# Patient Record
Sex: Female | Born: 1955
Health system: Southern US, Community
[De-identification: ages and names within clinical notes are randomized; demographics above are authoritative.]

## PROBLEM LIST (undated history)

## (undated) ENCOUNTER — Inpatient Hospital Stay: Admission: EM | Payer: Self-pay | Source: Home / Self Care

## (undated) DIAGNOSIS — I10 Essential (primary) hypertension: Secondary | ICD-10-CM

## (undated) DIAGNOSIS — N950 Postmenopausal bleeding: Secondary | ICD-10-CM

## (undated) DIAGNOSIS — E669 Obesity, unspecified: Secondary | ICD-10-CM

## (undated) DIAGNOSIS — E119 Type 2 diabetes mellitus without complications: Secondary | ICD-10-CM

## (undated) DIAGNOSIS — E785 Hyperlipidemia, unspecified: Secondary | ICD-10-CM

## (undated) HISTORY — DX: Postmenopausal bleeding: N95.0

## (undated) HISTORY — DX: Hyperlipidemia, unspecified: E78.5

## (undated) HISTORY — PX: FOOT SURGERY: SHX648

## (undated) HISTORY — PX: HAND SURGERY: SHX662

## (undated) HISTORY — DX: Essential (primary) hypertension: I10

## (undated) HISTORY — DX: Obesity, unspecified: E66.9

## (undated) HISTORY — PX: TUBAL LIGATION: SHX77

---

## 1997-09-18 ENCOUNTER — Emergency Department (HOSPITAL_COMMUNITY): Admission: EM | Admit: 1997-09-18 | Discharge: 1997-09-18 | Payer: Self-pay | Admitting: Emergency Medicine

## 1997-10-07 ENCOUNTER — Emergency Department (HOSPITAL_COMMUNITY): Admission: EM | Admit: 1997-10-07 | Discharge: 1997-10-07 | Payer: Self-pay | Admitting: Emergency Medicine

## 1997-10-08 ENCOUNTER — Ambulatory Visit (HOSPITAL_COMMUNITY): Admission: RE | Admit: 1997-10-08 | Discharge: 1997-10-08 | Payer: Self-pay | Admitting: Emergency Medicine

## 1997-10-24 ENCOUNTER — Ambulatory Visit (HOSPITAL_COMMUNITY): Admission: RE | Admit: 1997-10-24 | Discharge: 1997-10-24 | Payer: Self-pay | Admitting: Gastroenterology

## 1997-10-29 ENCOUNTER — Ambulatory Visit (HOSPITAL_COMMUNITY): Admission: RE | Admit: 1997-10-29 | Discharge: 1997-10-29 | Payer: Self-pay | Admitting: Gastroenterology

## 1997-10-31 ENCOUNTER — Emergency Department (HOSPITAL_COMMUNITY): Admission: EM | Admit: 1997-10-31 | Discharge: 1997-10-31 | Payer: Self-pay | Admitting: Emergency Medicine

## 1997-11-06 ENCOUNTER — Emergency Department (HOSPITAL_COMMUNITY): Admission: EM | Admit: 1997-11-06 | Discharge: 1997-11-06 | Payer: Self-pay | Admitting: Emergency Medicine

## 1997-11-06 ENCOUNTER — Ambulatory Visit (HOSPITAL_COMMUNITY): Admission: RE | Admit: 1997-11-06 | Discharge: 1997-11-06 | Payer: Self-pay | Admitting: Gastroenterology

## 1997-11-25 ENCOUNTER — Emergency Department (HOSPITAL_COMMUNITY): Admission: EM | Admit: 1997-11-25 | Discharge: 1997-11-25 | Payer: Self-pay | Admitting: Emergency Medicine

## 1998-01-21 ENCOUNTER — Ambulatory Visit (HOSPITAL_COMMUNITY): Admission: RE | Admit: 1998-01-21 | Discharge: 1998-01-21 | Payer: Self-pay | Admitting: Gastroenterology

## 1998-01-27 ENCOUNTER — Ambulatory Visit (HOSPITAL_COMMUNITY): Admission: RE | Admit: 1998-01-27 | Discharge: 1998-01-27 | Payer: Self-pay | Admitting: Gastroenterology

## 1998-01-27 ENCOUNTER — Encounter: Payer: Self-pay | Admitting: Gastroenterology

## 1998-03-05 ENCOUNTER — Encounter: Admission: RE | Admit: 1998-03-05 | Discharge: 1998-06-03 | Payer: Self-pay | Admitting: Orthopedic Surgery

## 1998-11-05 ENCOUNTER — Encounter: Payer: Self-pay | Admitting: Emergency Medicine

## 1998-11-05 ENCOUNTER — Emergency Department (HOSPITAL_COMMUNITY): Admission: EM | Admit: 1998-11-05 | Discharge: 1998-11-05 | Payer: Self-pay | Admitting: Emergency Medicine

## 1999-08-27 ENCOUNTER — Emergency Department (HOSPITAL_COMMUNITY): Admission: EM | Admit: 1999-08-27 | Discharge: 1999-08-27 | Payer: Self-pay | Admitting: Emergency Medicine

## 2000-08-25 ENCOUNTER — Emergency Department (HOSPITAL_COMMUNITY): Admission: EM | Admit: 2000-08-25 | Discharge: 2000-08-25 | Payer: Self-pay | Admitting: Emergency Medicine

## 2001-03-27 ENCOUNTER — Emergency Department (HOSPITAL_COMMUNITY): Admission: EM | Admit: 2001-03-27 | Discharge: 2001-03-27 | Payer: Self-pay | Admitting: Emergency Medicine

## 2001-05-29 ENCOUNTER — Emergency Department (HOSPITAL_COMMUNITY): Admission: EM | Admit: 2001-05-29 | Discharge: 2001-05-29 | Payer: Self-pay

## 2001-05-29 ENCOUNTER — Encounter: Payer: Self-pay | Admitting: Emergency Medicine

## 2001-08-17 ENCOUNTER — Emergency Department (HOSPITAL_COMMUNITY): Admission: EM | Admit: 2001-08-17 | Discharge: 2001-08-17 | Payer: Self-pay

## 2001-12-08 ENCOUNTER — Emergency Department (HOSPITAL_COMMUNITY): Admission: EM | Admit: 2001-12-08 | Discharge: 2001-12-08 | Payer: Self-pay | Admitting: Emergency Medicine

## 2001-12-25 ENCOUNTER — Encounter: Payer: Self-pay | Admitting: Emergency Medicine

## 2001-12-25 ENCOUNTER — Emergency Department (HOSPITAL_COMMUNITY): Admission: EM | Admit: 2001-12-25 | Discharge: 2001-12-25 | Payer: Self-pay | Admitting: Emergency Medicine

## 2002-04-13 ENCOUNTER — Encounter: Payer: Self-pay | Admitting: Emergency Medicine

## 2002-04-13 ENCOUNTER — Emergency Department (HOSPITAL_COMMUNITY): Admission: EM | Admit: 2002-04-13 | Discharge: 2002-04-13 | Payer: Self-pay | Admitting: Emergency Medicine

## 2002-04-19 ENCOUNTER — Emergency Department (HOSPITAL_COMMUNITY): Admission: EM | Admit: 2002-04-19 | Discharge: 2002-04-19 | Payer: Self-pay | Admitting: Emergency Medicine

## 2003-01-15 ENCOUNTER — Encounter: Payer: Self-pay | Admitting: Family Medicine

## 2003-01-15 ENCOUNTER — Emergency Department (HOSPITAL_COMMUNITY): Admission: AD | Admit: 2003-01-15 | Discharge: 2003-01-15 | Payer: Self-pay | Admitting: Family Medicine

## 2003-02-06 ENCOUNTER — Emergency Department (HOSPITAL_COMMUNITY): Admission: EM | Admit: 2003-02-06 | Discharge: 2003-02-06 | Payer: Self-pay | Admitting: Emergency Medicine

## 2003-04-22 ENCOUNTER — Emergency Department (HOSPITAL_COMMUNITY): Admission: AD | Admit: 2003-04-22 | Discharge: 2003-04-22 | Payer: Self-pay | Admitting: Family Medicine

## 2003-04-23 ENCOUNTER — Emergency Department (HOSPITAL_COMMUNITY): Admission: AD | Admit: 2003-04-23 | Discharge: 2003-04-23 | Payer: Self-pay | Admitting: Family Medicine

## 2003-08-30 ENCOUNTER — Emergency Department (HOSPITAL_COMMUNITY): Admission: EM | Admit: 2003-08-30 | Discharge: 2003-08-30 | Payer: Self-pay | Admitting: Emergency Medicine

## 2003-09-04 ENCOUNTER — Emergency Department (HOSPITAL_COMMUNITY): Admission: EM | Admit: 2003-09-04 | Discharge: 2003-09-04 | Payer: Self-pay | Admitting: Emergency Medicine

## 2003-12-23 ENCOUNTER — Emergency Department (HOSPITAL_COMMUNITY): Admission: EM | Admit: 2003-12-23 | Discharge: 2003-12-23 | Payer: Self-pay | Admitting: Emergency Medicine

## 2004-03-03 ENCOUNTER — Emergency Department (HOSPITAL_COMMUNITY): Admission: EM | Admit: 2004-03-03 | Discharge: 2004-03-03 | Payer: Self-pay | Admitting: Emergency Medicine

## 2004-06-24 ENCOUNTER — Emergency Department (HOSPITAL_COMMUNITY): Admission: EM | Admit: 2004-06-24 | Discharge: 2004-06-24 | Payer: Self-pay | Admitting: *Deleted

## 2004-08-16 ENCOUNTER — Emergency Department (HOSPITAL_COMMUNITY): Admission: EM | Admit: 2004-08-16 | Discharge: 2004-08-16 | Payer: Self-pay | Admitting: *Deleted

## 2005-02-05 ENCOUNTER — Emergency Department (HOSPITAL_COMMUNITY): Admission: EM | Admit: 2005-02-05 | Discharge: 2005-02-05 | Payer: Self-pay | Admitting: Emergency Medicine

## 2005-04-19 ENCOUNTER — Emergency Department (HOSPITAL_COMMUNITY): Admission: EM | Admit: 2005-04-19 | Discharge: 2005-04-19 | Payer: Self-pay | Admitting: Emergency Medicine

## 2005-06-22 ENCOUNTER — Emergency Department (HOSPITAL_COMMUNITY): Admission: EM | Admit: 2005-06-22 | Discharge: 2005-06-22 | Payer: Self-pay | Admitting: Emergency Medicine

## 2005-06-29 ENCOUNTER — Emergency Department (HOSPITAL_COMMUNITY): Admission: EM | Admit: 2005-06-29 | Discharge: 2005-06-29 | Payer: Self-pay | Admitting: Emergency Medicine

## 2005-11-15 ENCOUNTER — Emergency Department (HOSPITAL_COMMUNITY): Admission: EM | Admit: 2005-11-15 | Discharge: 2005-11-15 | Payer: Self-pay | Admitting: Emergency Medicine

## 2005-11-15 ENCOUNTER — Inpatient Hospital Stay (HOSPITAL_COMMUNITY): Admission: AD | Admit: 2005-11-15 | Discharge: 2005-11-15 | Payer: Self-pay | Admitting: Gynecology

## 2006-03-06 ENCOUNTER — Emergency Department (HOSPITAL_COMMUNITY): Admission: EM | Admit: 2006-03-06 | Discharge: 2006-03-06 | Payer: Self-pay | Admitting: Emergency Medicine

## 2006-05-26 ENCOUNTER — Emergency Department (HOSPITAL_COMMUNITY): Admission: EM | Admit: 2006-05-26 | Discharge: 2006-05-26 | Payer: Self-pay | Admitting: Emergency Medicine

## 2006-06-20 ENCOUNTER — Emergency Department (HOSPITAL_COMMUNITY): Admission: EM | Admit: 2006-06-20 | Discharge: 2006-06-20 | Payer: Self-pay | Admitting: Emergency Medicine

## 2006-12-29 ENCOUNTER — Emergency Department (HOSPITAL_COMMUNITY): Admission: EM | Admit: 2006-12-29 | Discharge: 2006-12-29 | Payer: Self-pay | Admitting: Emergency Medicine

## 2007-07-16 ENCOUNTER — Emergency Department (HOSPITAL_COMMUNITY): Admission: EM | Admit: 2007-07-16 | Discharge: 2007-07-16 | Payer: Self-pay | Admitting: Emergency Medicine

## 2007-08-10 ENCOUNTER — Emergency Department (HOSPITAL_COMMUNITY): Admission: EM | Admit: 2007-08-10 | Discharge: 2007-08-10 | Payer: Self-pay | Admitting: Emergency Medicine

## 2007-09-05 ENCOUNTER — Emergency Department (HOSPITAL_COMMUNITY): Admission: EM | Admit: 2007-09-05 | Discharge: 2007-09-05 | Payer: Self-pay | Admitting: Emergency Medicine

## 2007-10-05 ENCOUNTER — Emergency Department (HOSPITAL_COMMUNITY): Admission: EM | Admit: 2007-10-05 | Discharge: 2007-10-05 | Payer: Self-pay | Admitting: Emergency Medicine

## 2007-10-27 ENCOUNTER — Emergency Department (HOSPITAL_COMMUNITY): Admission: EM | Admit: 2007-10-27 | Discharge: 2007-10-27 | Payer: Self-pay | Admitting: Emergency Medicine

## 2007-11-02 ENCOUNTER — Ambulatory Visit: Payer: Self-pay | Admitting: Internal Medicine

## 2007-11-15 ENCOUNTER — Ambulatory Visit (HOSPITAL_COMMUNITY): Admission: RE | Admit: 2007-11-15 | Discharge: 2007-11-15 | Payer: Self-pay | Admitting: Internal Medicine

## 2007-12-13 ENCOUNTER — Ambulatory Visit: Payer: Self-pay | Admitting: Internal Medicine

## 2008-01-05 ENCOUNTER — Emergency Department (HOSPITAL_COMMUNITY): Admission: EM | Admit: 2008-01-05 | Discharge: 2008-01-05 | Payer: Self-pay | Admitting: Emergency Medicine

## 2008-01-30 ENCOUNTER — Ambulatory Visit: Payer: Self-pay | Admitting: Internal Medicine

## 2008-01-30 LAB — CONVERTED CEMR LAB
Alkaline Phosphatase: 80 units/L (ref 39–117)
CO2: 23 meq/L (ref 19–32)
Cholesterol: 183 mg/dL (ref 0–200)
Creatinine, Ser: 0.94 mg/dL (ref 0.40–1.20)
Eosinophils Absolute: 0.1 10*3/uL (ref 0.0–0.7)
Eosinophils Relative: 1 % (ref 0–5)
Glucose, Bld: 90 mg/dL (ref 70–99)
HCT: 41.5 % (ref 36.0–46.0)
HDL: 50 mg/dL (ref >39)
Hemoglobin: 13.9 g/dL (ref 12.0–15.0)
LDL Cholesterol: 89 mg/dL (ref 0–99)
Lymphs Abs: 2.5 10*3/uL (ref 0.7–4.0)
MCV: 90.6 fL (ref 78.0–100.0)
Monocytes Absolute: 0.6 10*3/uL (ref 0.1–1.0)
Platelets: 346 10*3/uL (ref 150–400)
RDW: 13.1 % (ref 11.5–15.5)
Sodium: 139 meq/L (ref 135–145)
Total Bilirubin: 0.4 mg/dL (ref 0.3–1.2)
Total CHOL/HDL Ratio: 3.7
Total Protein: 7.8 g/dL (ref 6.0–8.3)
Triglycerides: 219 mg/dL — ABNORMAL HIGH (ref <150)
VLDL: 44 mg/dL — ABNORMAL HIGH (ref 0–40)
WBC: 7.2 10*3/uL (ref 4.0–10.5)

## 2008-03-20 ENCOUNTER — Emergency Department (HOSPITAL_COMMUNITY): Admission: EM | Admit: 2008-03-20 | Discharge: 2008-03-20 | Payer: Self-pay | Admitting: Emergency Medicine

## 2008-04-10 ENCOUNTER — Ambulatory Visit: Payer: Self-pay | Admitting: Internal Medicine

## 2008-04-10 ENCOUNTER — Encounter: Payer: Self-pay | Admitting: Internal Medicine

## 2008-05-07 ENCOUNTER — Ambulatory Visit: Payer: Self-pay | Admitting: Internal Medicine

## 2008-06-17 ENCOUNTER — Emergency Department: Payer: Self-pay | Admitting: Emergency Medicine

## 2008-06-23 ENCOUNTER — Emergency Department (HOSPITAL_COMMUNITY): Admission: EM | Admit: 2008-06-23 | Discharge: 2008-06-23 | Payer: Self-pay | Admitting: Emergency Medicine

## 2008-08-28 ENCOUNTER — Emergency Department (HOSPITAL_COMMUNITY): Admission: EM | Admit: 2008-08-28 | Discharge: 2008-08-28 | Payer: Self-pay | Admitting: Emergency Medicine

## 2008-09-07 ENCOUNTER — Emergency Department (HOSPITAL_COMMUNITY): Admission: EM | Admit: 2008-09-07 | Discharge: 2008-09-07 | Payer: Self-pay | Admitting: Emergency Medicine

## 2008-11-04 ENCOUNTER — Emergency Department (HOSPITAL_COMMUNITY): Admission: EM | Admit: 2008-11-04 | Discharge: 2008-11-04 | Payer: Self-pay | Admitting: Emergency Medicine

## 2009-01-14 ENCOUNTER — Emergency Department (HOSPITAL_COMMUNITY): Admission: EM | Admit: 2009-01-14 | Discharge: 2009-01-14 | Payer: Self-pay | Admitting: Emergency Medicine

## 2010-04-19 ENCOUNTER — Encounter: Payer: Self-pay | Admitting: Family Medicine

## 2010-04-19 ENCOUNTER — Encounter: Payer: Self-pay | Admitting: Internal Medicine

## 2010-06-05 IMAGING — CR DG LUMBAR SPINE 2-3V
1 series · 3 of 3 positions shown · non-contrast
Comparison: none

REASON FOR EXAM: fall pain
COMMENTS:

[Series 1: view not recorded · 0.17mm/px · 3 of 3 slices shown]
[im 1/3]
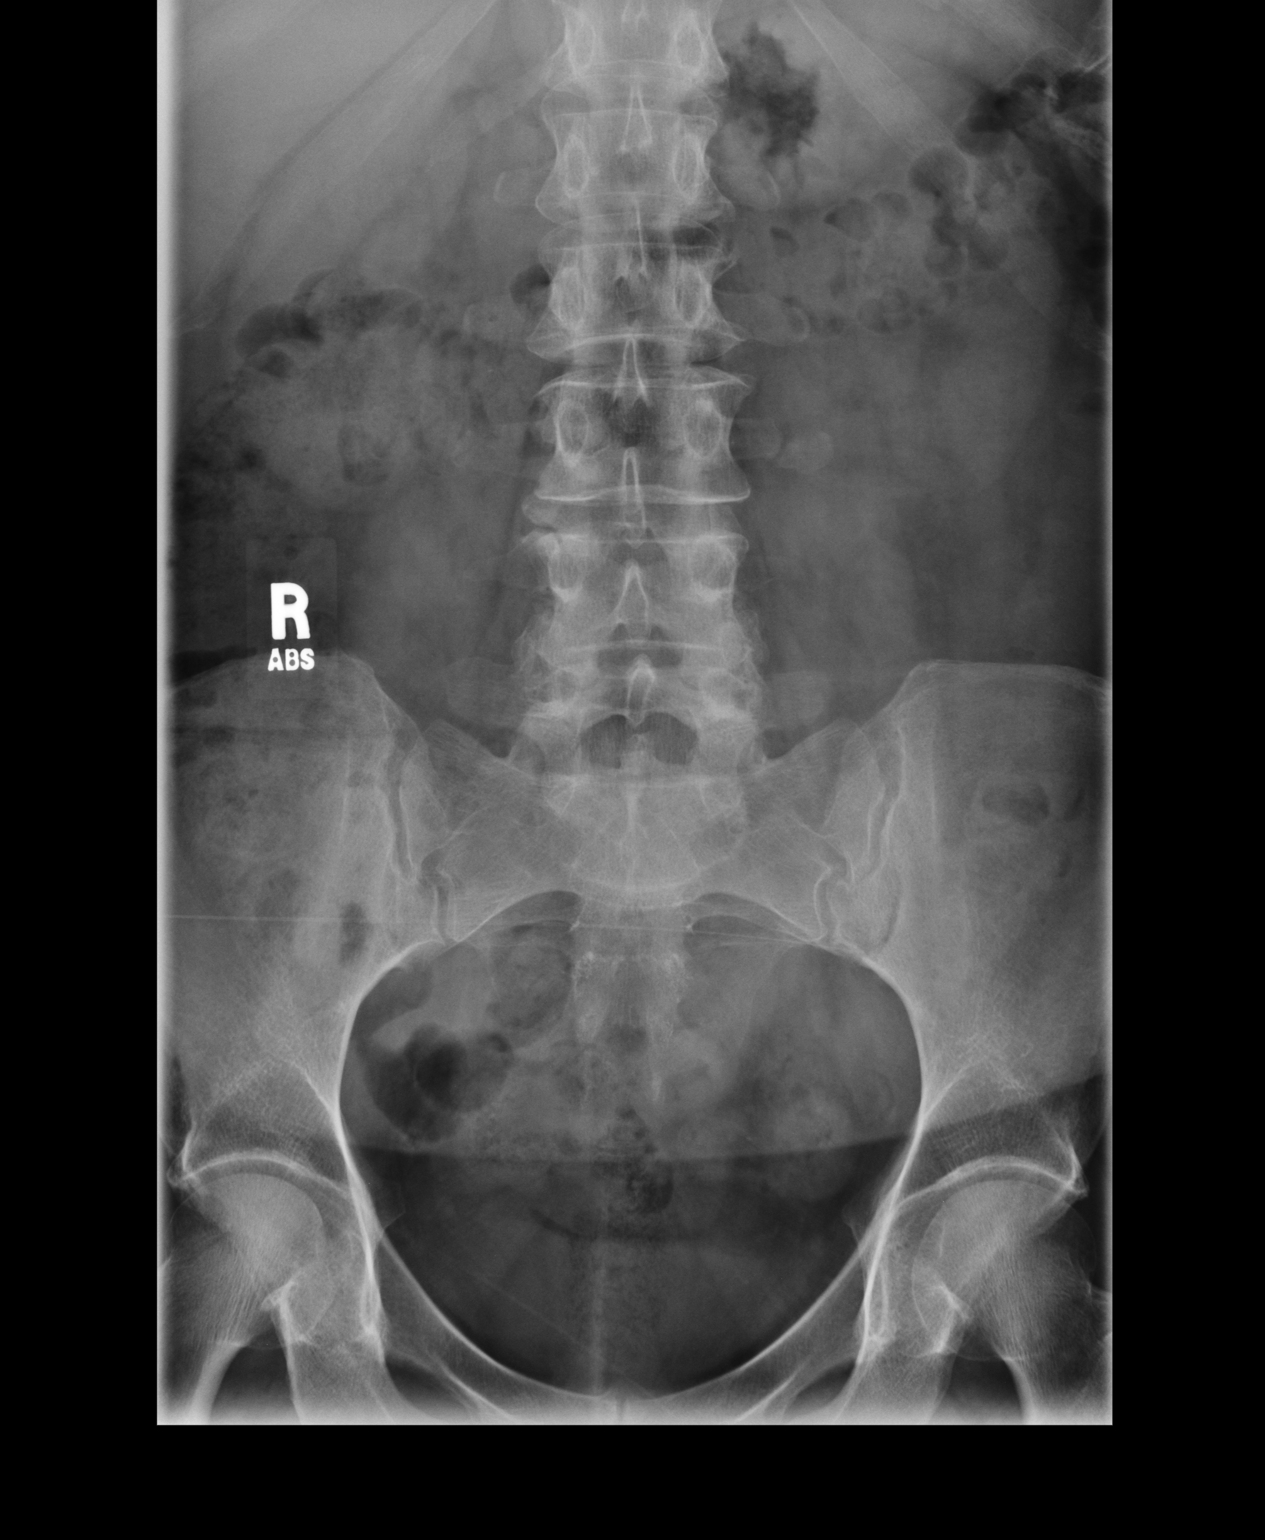
[im 2/3]
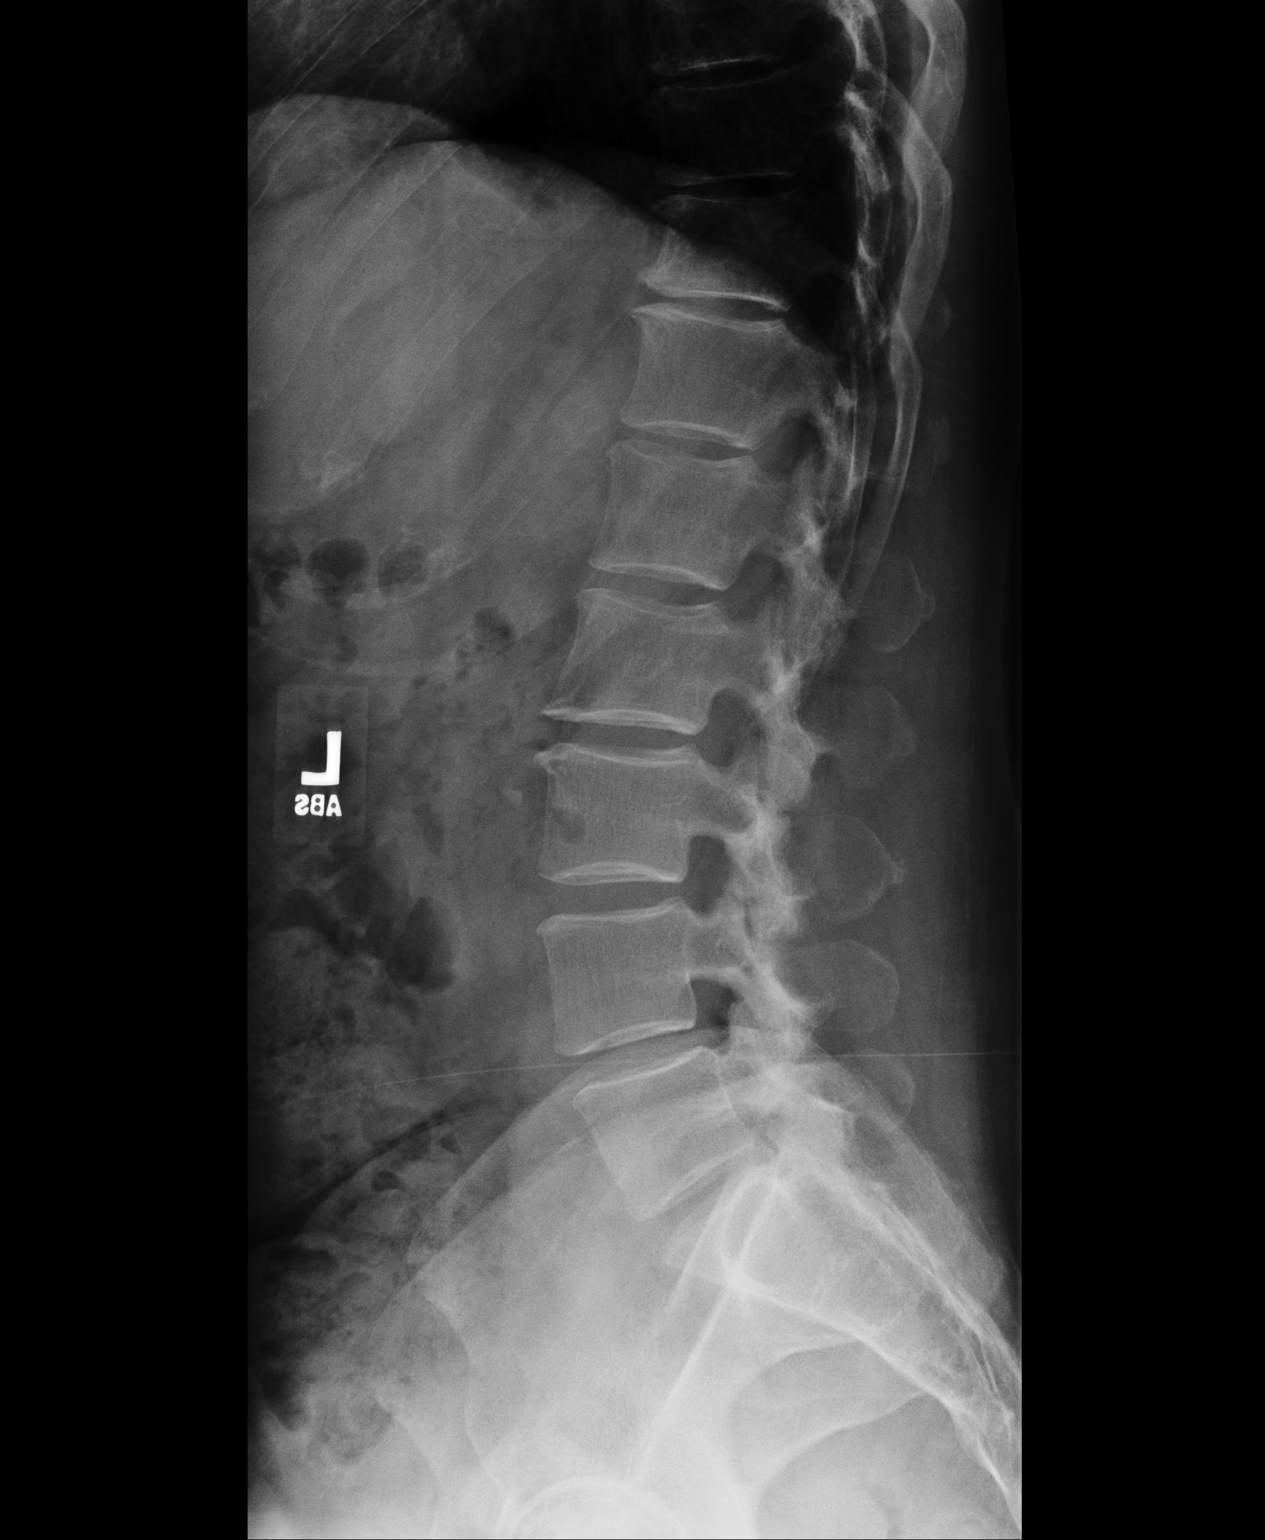
[im 3/3]
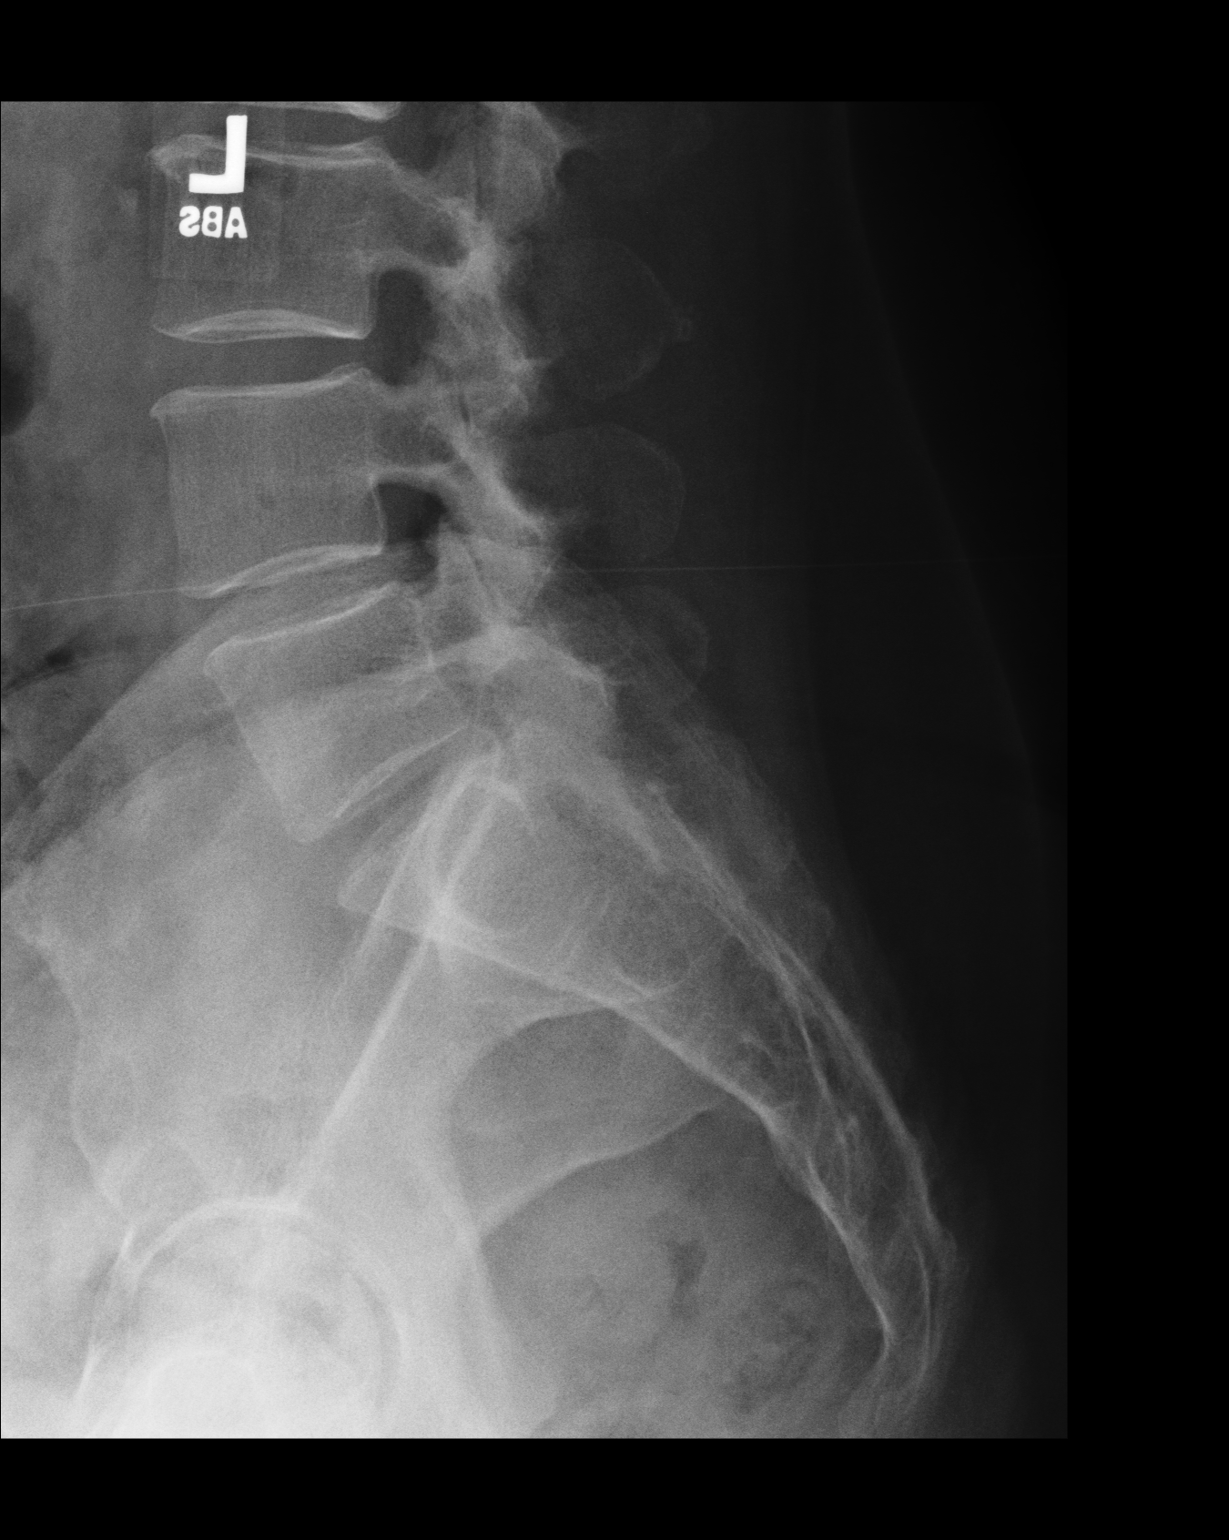

[3 of 3 positions shown; findings below may reference images not displayed]

PROCEDURE:     DXR - DXR LUMBAR SPINE AP AND LATERAL  - June 17, 2008 [DATE]

RESULT:     The vertebral body heights and in the intervertebral disc spaces
are well maintained. Vertebral body alignment is normal. There is mild
degenerative spurring anteriorly at L2 and L3. In the AP view there is noted
a cleft in the left transverse process of L3. This could either be
developmental or secondary to prior trauma. The pedicles are bilaterally
intact.
IMPRESSION: 1. No acute bony abnormalities are identified.
2. There is a cleft in the transverse process at L3 on the left probably
developmental but which could represent residual change from a previous
fracture.
3. There is slight hypertrophic spurring anteriorly at L2 and L3.

## 2010-07-02 LAB — DIFFERENTIAL
Basophils Absolute: 0 K/uL (ref 0.0–0.1)
Basophils Relative: 0 % (ref 0–1)
Eosinophils Absolute: 0.1 K/uL (ref 0.0–0.7)
Eosinophils Relative: 2 % (ref 0–5)
Lymphocytes Relative: 25 % (ref 12–46)
Lymphs Abs: 2 K/uL (ref 0.7–4.0)
Monocytes Absolute: 0.5 K/uL (ref 0.1–1.0)
Monocytes Relative: 6 % (ref 3–12)
Neutro Abs: 5.6 K/uL (ref 1.7–7.7)
Neutrophils Relative %: 67 % (ref 43–77)

## 2010-07-02 LAB — URINALYSIS, ROUTINE W REFLEX MICROSCOPIC
Bilirubin Urine: NEGATIVE
Glucose, UA: NEGATIVE mg/dL
Hgb urine dipstick: NEGATIVE
Ketones, ur: NEGATIVE mg/dL
Specific Gravity, Urine: 1.004 — ABNORMAL LOW (ref 1.005–1.030)
pH: 6.5 (ref 5.0–8.0)

## 2010-07-02 LAB — RAPID URINE DRUG SCREEN, HOSP PERFORMED
Amphetamines: NOT DETECTED
Barbiturates: NOT DETECTED
Benzodiazepines: POSITIVE — AB
Cocaine: POSITIVE — AB
Opiates: POSITIVE — AB
Tetrahydrocannabinol: NOT DETECTED

## 2010-07-02 LAB — COMPREHENSIVE METABOLIC PANEL
ALT: 31 U/L (ref 0–35)
AST: 31 U/L (ref 0–37)
CO2: 27 mEq/L (ref 19–32)
Calcium: 10 mg/dL (ref 8.4–10.5)
Chloride: 104 mEq/L (ref 96–112)
Creatinine, Ser: 0.76 mg/dL (ref 0.4–1.2)
GFR calc Af Amer: >60 mL/min (ref >60)
GFR calc non Af Amer: >60 mL/min (ref >60)
Glucose, Bld: 122 mg/dL — ABNORMAL HIGH (ref 70–99)
Total Bilirubin: 0.5 mg/dL (ref 0.3–1.2)

## 2010-07-02 LAB — CBC
Hemoglobin: 15.7 g/dL — ABNORMAL HIGH (ref 12.0–15.0)
MCHC: 34.7 g/dL (ref 30.0–36.0)
MCV: 88.4 fL (ref 78.0–100.0)
RBC: 5.11 MIL/uL (ref 3.87–5.11)
WBC: 8.3 10*3/uL (ref 4.0–10.5)

## 2010-07-02 LAB — PREGNANCY, URINE: Preg Test, Ur: NEGATIVE

## 2010-07-02 LAB — SALICYLATE LEVEL: Salicylate Lvl: <4 mg/dL (ref 2.8–20.0)

## 2010-07-06 LAB — CBC
HCT: 41 % (ref 36.0–46.0)
Hemoglobin: 14.4 g/dL (ref 12.0–15.0)
Platelets: 318 10*3/uL (ref 150–400)
WBC: 13.5 10*3/uL — ABNORMAL HIGH (ref 4.0–10.5)

## 2010-07-06 LAB — DIFFERENTIAL
Basophils Absolute: 0 10*3/uL (ref 0.0–0.1)
Basophils Relative: 0 % (ref 0–1)
Eosinophils Absolute: 0.1 10*3/uL (ref 0.0–0.7)
Monocytes Absolute: 0.9 10*3/uL (ref 0.1–1.0)
Neutro Abs: 10.8 10*3/uL — ABNORMAL HIGH (ref 1.7–7.7)

## 2010-07-06 LAB — COMPREHENSIVE METABOLIC PANEL
Albumin: 4 g/dL (ref 3.5–5.2)
Alkaline Phosphatase: 74 U/L (ref 39–117)
BUN: 16 mg/dL (ref 6–23)
Chloride: 106 mEq/L (ref 96–112)
Glucose, Bld: 139 mg/dL — ABNORMAL HIGH (ref 70–99)
Potassium: 4.1 mEq/L (ref 3.5–5.1)
Total Bilirubin: 0.7 mg/dL (ref 0.3–1.2)

## 2010-07-06 LAB — URINALYSIS, ROUTINE W REFLEX MICROSCOPIC
Bilirubin Urine: NEGATIVE
Glucose, UA: NEGATIVE mg/dL
Ketones, ur: NEGATIVE mg/dL
Protein, ur: NEGATIVE mg/dL

## 2010-07-06 LAB — URINE MICROSCOPIC-ADD ON

## 2011-08-04 ENCOUNTER — Ambulatory Visit (HOSPITAL_COMMUNITY)
Admission: RE | Admit: 2011-08-04 | Discharge: 2011-08-04 | Disposition: A | Discharge status: Home / Self Care | Payer: Self-pay | Source: Ambulatory Visit | Attending: Psychiatry | Admitting: Psychiatry

## 2011-08-04 DIAGNOSIS — R9431 Abnormal electrocardiogram [ECG] [EKG]: Secondary | ICD-10-CM | POA: Insufficient documentation

## 2011-08-04 DIAGNOSIS — R009 Unspecified abnormalities of heart beat: Secondary | ICD-10-CM

## 2013-03-15 ENCOUNTER — Ambulatory Visit: Payer: Self-pay | Attending: Internal Medicine | Admitting: Internal Medicine

## 2013-03-15 ENCOUNTER — Encounter: Payer: Self-pay | Admitting: Internal Medicine

## 2013-03-15 VITALS — BP 190/80 | HR 62 | Temp 98.7°F | Resp 16 | Wt 189.4 lb

## 2013-03-15 DIAGNOSIS — M62830 Muscle spasm of back: Secondary | ICD-10-CM

## 2013-03-15 DIAGNOSIS — Z23 Encounter for immunization: Secondary | ICD-10-CM

## 2013-03-15 DIAGNOSIS — IMO0001 Reserved for inherently not codable concepts without codable children: Secondary | ICD-10-CM

## 2013-03-15 DIAGNOSIS — N950 Postmenopausal bleeding: Secondary | ICD-10-CM

## 2013-03-15 DIAGNOSIS — F17200 Nicotine dependence, unspecified, uncomplicated: Secondary | ICD-10-CM

## 2013-03-15 DIAGNOSIS — F1910 Other psychoactive substance abuse, uncomplicated: Secondary | ICD-10-CM

## 2013-03-15 DIAGNOSIS — F1911 Other psychoactive substance abuse, in remission: Secondary | ICD-10-CM

## 2013-03-15 DIAGNOSIS — G8929 Other chronic pain: Secondary | ICD-10-CM

## 2013-03-15 DIAGNOSIS — I10 Essential (primary) hypertension: Secondary | ICD-10-CM

## 2013-03-15 DIAGNOSIS — Z139 Encounter for screening, unspecified: Secondary | ICD-10-CM

## 2013-03-15 DIAGNOSIS — M5380 Other specified dorsopathies, site unspecified: Secondary | ICD-10-CM

## 2013-03-15 DIAGNOSIS — M542 Cervicalgia: Secondary | ICD-10-CM | POA: Insufficient documentation

## 2013-03-15 DIAGNOSIS — M549 Dorsalgia, unspecified: Secondary | ICD-10-CM | POA: Insufficient documentation

## 2013-03-15 LAB — CBC WITH DIFFERENTIAL/PLATELET
Basophils Absolute: 0 10*3/uL (ref 0.0–0.1)
Basophils Relative: 0 % (ref 0–1)
Hemoglobin: 14.1 g/dL (ref 12.0–15.0)
MCHC: 34.6 g/dL (ref 30.0–36.0)
Neutro Abs: 6.3 10*3/uL (ref 1.7–7.7)
Neutrophils Relative %: 62 % (ref 43–77)
Platelets: 337 10*3/uL (ref 150–400)
RDW: 13.8 % (ref 11.5–15.5)

## 2013-03-15 LAB — COMPLETE METABOLIC PANEL WITH GFR
AST: 16 U/L (ref 0–37)
Albumin: 4.1 g/dL (ref 3.5–5.2)
Alkaline Phosphatase: 85 U/L (ref 39–117)
Potassium: 4.5 mEq/L (ref 3.5–5.3)
Sodium: 141 mEq/L (ref 135–145)
Total Bilirubin: 0.4 mg/dL (ref 0.3–1.2)
Total Protein: 6.7 g/dL (ref 6.0–8.3)

## 2013-03-15 LAB — LIPID PANEL
HDL: 32 mg/dL — ABNORMAL LOW (ref >39)
LDL Cholesterol: 82 mg/dL (ref 0–99)
Total CHOL/HDL Ratio: 4.7 Ratio
Triglycerides: 183 mg/dL — ABNORMAL HIGH (ref <150)

## 2013-03-15 MED ORDER — CLONIDINE HCL 0.1 MG PO TABS: 0.1000 mg | ORAL_TABLET | Freq: Two times a day (BID) | ORAL | Status: DC

## 2013-03-15 MED ORDER — AMLODIPINE BESYLATE 5 MG PO TABS: 5.0000 mg | ORAL_TABLET | Freq: Every day | ORAL | Status: DC

## 2013-03-15 MED ORDER — CYCLOBENZAPRINE HCL 10 MG PO TABS: 10.0000 mg | ORAL_TABLET | Freq: Three times a day (TID) | ORAL | Status: DC | PRN

## 2013-03-15 MED ORDER — NICOTINE 21 MG/24HR TD PT24: 21.0000 mg | MEDICATED_PATCH | Freq: Every day | TRANSDERMAL | Status: DC

## 2013-03-15 MED ORDER — NAPROXEN 500 MG PO TABS: 500.0000 mg | ORAL_TABLET | Freq: Two times a day (BID) | ORAL | Status: DC

## 2013-03-15 MED ORDER — CLONIDINE HCL 0.1 MG PO TABS
0.1000 mg | ORAL_TABLET | Freq: Once | ORAL | Status: AC
  Administered 2013-03-15: 0.1 mg via ORAL

## 2013-03-15 NOTE — Progress Notes (Signed)
Patient ID: Charlene Allen, female   DOB: June 24, 1955, 57 y.o.   MRN: [id number] MRN: [id number] Name: Charlene Allen  Sex: female Age: 57 y.o. DOB: [birth year]  Allergies: Review of patient's allergies indicates no known allergies.  Chief Complaint  Patient presents with  . Establish Care    HPI: Patient is 57 y.o. female who comes today for a stent establish medical care, she is history of polysubstance abuse upward abuse and is currently enrolled in a methadone program, as per patient her blood pressure was high 6 months ago and she was put on clonidine 0.1 mg twice a day she has not taken her medication today in the morning, has complain of headache on and off denies any change in the vision numbness or weakness, she reported to have chronic neck pain and back pain for several years does not take any medication denies any shooting pain down to the legs denies any incontinence denies any fever chills chest pain or shortness of breath denies any nausea vomiting any urinary or GI symptoms. As per patient she is postmenopausal but has been having vaginal bleeding on and off for the last several months which is getting worse now patient has not seen any gynecologist   History reviewed. No pertinent past medical history.  Past Surgical History  Procedure Laterality Date  . Tubal ligation    . Hand surgery        Medication List       This list is accurate as of: 03/15/13  9:49 AM.  Always use your most recent med list.               amLODipine 5 MG tablet  Commonly known as:  NORVASC  Take 1 tablet (5 mg total) by mouth daily.     cloNIDine 0.1 MG tablet  Commonly known as:  CATAPRES  Take 1 tablet (0.1 mg total) by mouth 2 (two) times daily.     cyclobenzaprine 10 MG tablet  Commonly known as:  FLEXERIL  Take 1 tablet (10 mg total) by mouth 3 (three) times daily as needed for muscle spasms.     naproxen 500 MG tablet  Commonly known as:  NAPROSYN  Take 1 tablet (500 mg  total) by mouth 2 (two) times daily with a meal.     nicotine 21 mg/24hr patch  Commonly known as:  NICODERM CQ  Place 1 patch (21 mg total) onto the skin daily.        Meds ordered this encounter  Medications  . DISCONTD: CLONIDINE HCL PO    Sig: Take by mouth.  . nicotine (NICODERM CQ) 21 mg/24hr patch    Sig: Place 1 patch (21 mg total) onto the skin daily.    Dispense:  28 patch    Refill:  0  . amLODipine (NORVASC) 5 MG tablet    Sig: Take 1 tablet (5 mg total) by mouth daily.    Dispense:  90 tablet    Refill:  3  . cyclobenzaprine (FLEXERIL) 10 MG tablet    Sig: Take 1 tablet (10 mg total) by mouth 3 (three) times daily as needed for muscle spasms.    Dispense:  30 tablet    Refill:  0  . naproxen (NAPROSYN) 500 MG tablet    Sig: Take 1 tablet (500 mg total) by mouth 2 (two) times daily with a meal.    Dispense:  30 tablet    Refill:  2  . cloNIDine (CATAPRES) 0.1 MG  tablet    Sig: Take 1 tablet (0.1 mg total) by mouth 2 (two) times daily.    Dispense:  60 tablet    Refill:  3     There is no immunization history on file for this patient.  Family History  Problem Relation Age of Onset  . COPD Mother   . Stroke Mother   . Heart disease Mother   . Hypertension Mother   . Cancer Sister 25    cervical cancer   . Stroke Maternal Grandmother     History  Substance Use Topics  . Smoking status: Heavy Tobacco Smoker -- 1.00 packs/day for 40 years  . Smokeless tobacco: Not on file  . Alcohol Use: No    Review of Systems  As noted in HPI  Filed Vitals:   03/15/13 0948  BP: 190/80  Pulse:   Temp:   Resp:     Physical Exam  Physical Exam  Constitutional: No distress.  HENT:  Head: Normocephalic and atraumatic.  Eyes: Conjunctivae are normal. Pupils are equal, round, and reactive to light.  Pinpoint pupils( pt on methadone)  Neck: Normal range of motion. Neck supple.  No tenderness on the neck  Cardiovascular: Normal rate and regular rhythm.    Pulmonary/Chest: Breath sounds normal. No respiratory distress. She has no wheezes.  Abdominal: Soft. There is no tenderness.  Musculoskeletal:  Lower lumbar left paraspinal tenderness SLR test negative equal strength both lower studies DTR 2+  Neurological: She has normal reflexes.    CBC    Component Value Date/Time   WBC 8.3 01/14/2009 0952   RBC 5.11 01/14/2009 0952   HGB 15.7* 01/14/2009 0952   HCT 45.2 01/14/2009 0952   PLT 342 01/14/2009 0952   MCV 88.4 01/14/2009 0952   LYMPHSABS 2.0 01/14/2009 0952   MONOABS 0.5 01/14/2009 0952   EOSABS 0.1 01/14/2009 0952   BASOSABS 0.0 01/14/2009 0952    CMP     Component Value Date/Time   NA 139 01/14/2009 0952   K 4.0 01/14/2009 0952   CL 104 01/14/2009 0952   CO2 27 01/14/2009 0952   GLUCOSE 122* 01/14/2009 0952   BUN 12 01/14/2009 0952   CREATININE 0.76 01/14/2009 0952   CALCIUM 10.0 01/14/2009 0952   PROT 8.1 01/14/2009 0952   ALBUMIN 4.5 01/14/2009 0952   AST 31 01/14/2009 0952   ALT 31 01/14/2009 0952   ALKPHOS 88 01/14/2009 0952   BILITOT 0.5 01/14/2009 0952   GFRNONAA >60 01/14/2009 0952   GFRAA  Value: >60        The eGFR has been calculated using the MDRD equation. This calculation has not been validated in all clinical situations. eGFR's persistently <60 mL/min signify possible Chronic Kidney Disease. 01/14/2009 0952    Lab Results  Component Value Date/Time   CHOL 183 01/30/2008 11:10 PM    No components found with this basename: hga1c    Lab Results  Component Value Date/Time   AST 31 01/14/2009  9:52 AM    Assessment and Plan  Chronic back pain/Chronic neck pain  - Plan: Will try  naproxen (NAPROSYN) 500 MG tablet twice a day when necessary   Essential hypertension, benign - Plan: pt did not take her medication today  manual blood pressure is 190/80 advised patient for low salt diet patient was given clonidine in the office will start on amLODipine (NORVASC) 5 MG tablet daily, continue with her  cloNIDine (CATAPRES) 0.1 MG tablet twice a day.  History of  substance abuse ... patient is currently enrolled in methadone program  Postmenopausal bleeding - Plan: CBC with Differential, Ambulatory referral to Gynecology  Screening - Plan: COMPLETE METABOLIC PANEL WITH GFR, TSH, Lipid panel, Vit D  25 hydroxy (rtn osteoporosis monitoring)  Smoking - Plan: nicotine (NICODERM CQ) 21 mg/24hr patch  Back muscle spasm - Plan: Patient advised to apply heating pad also prescribed cyclobenzaprine (FLEXERIL) 10 MG tablet advised patient to take only at night.  Needs flu shot   Health Maintenance  -Pap Smear: referred to GYN Flu shot given today    Return in about 4 weeks (around 04/12/2013).  Doris Cheadle, MD

## 2013-03-15 NOTE — Patient Instructions (Signed)

## 2013-03-15 NOTE — Progress Notes (Signed)
Patient here to establish care Takes clonidine for HTN Has had surgery for tubal ligation and  Carpal tunnel surgery Has not had a period in eight years but lately at least once a month Gets a gush of bright red blood Has chronic back pain and neck pain Has bulging disk in neck and back

## 2013-03-16 ENCOUNTER — Telehealth: Payer: Self-pay | Admitting: Emergency Medicine

## 2013-03-16 LAB — VITAMIN D 25 HYDROXY (VIT D DEFICIENCY, FRACTURES): Vit D, 25-Hydroxy: 27 ng/mL — ABNORMAL LOW (ref 30–89)

## 2013-03-16 NOTE — Telephone Encounter (Signed)
Pt given lab results and informed to start taking otc Vitamin d 1000 units supplement and low fat diet

## 2013-03-16 NOTE — Telephone Encounter (Signed)
Message copied by Darlis Loan on Fri Mar 16, 2013 10:17 AM ------      Message from: Doris Cheadle      Created: Fri Mar 16, 2013  9:19 AM       Blood work reviewed, noticed low vitamin D, call patient advise to start taking OTC vitamin d 1000 units daily       Also has elevated triglycerides, advise for low fat diet.       ------

## 2013-03-19 ENCOUNTER — Telehealth: Payer: Self-pay

## 2013-03-19 NOTE — Telephone Encounter (Signed)
Message copied by Lestine Mount on Mon Mar 19, 2013 11:44 AM ------      Message from: Doris Cheadle      Created: Fri Mar 16, 2013  9:19 AM       Blood work reviewed, noticed low vitamin D, call patient advise to start taking OTC vitamin d 1000 units daily       Also has elevated triglycerides, advise for low fat diet.       ------

## 2013-04-12 ENCOUNTER — Ambulatory Visit: Payer: Self-pay | Admitting: Internal Medicine

## 2013-04-18 ENCOUNTER — Ambulatory Visit: Payer: Self-pay

## 2013-05-07 ENCOUNTER — Encounter: Payer: Self-pay | Admitting: Obstetrics & Gynecology

## 2013-05-22 ENCOUNTER — Ambulatory Visit: Payer: Self-pay | Admitting: Internal Medicine

## 2013-06-01 ENCOUNTER — Ambulatory Visit: Payer: Self-pay

## 2013-06-06 ENCOUNTER — Ambulatory Visit: Payer: Self-pay

## 2013-06-13 ENCOUNTER — Ambulatory Visit: Payer: Self-pay | Attending: Internal Medicine | Admitting: Internal Medicine

## 2013-06-13 ENCOUNTER — Encounter: Payer: Self-pay | Admitting: Internal Medicine

## 2013-06-13 VITALS — BP 157/94 | HR 68 | Temp 98.4°F | Resp 16

## 2013-06-13 DIAGNOSIS — M549 Dorsalgia, unspecified: Secondary | ICD-10-CM | POA: Insufficient documentation

## 2013-06-13 DIAGNOSIS — I10 Essential (primary) hypertension: Secondary | ICD-10-CM

## 2013-06-13 DIAGNOSIS — F1911 Other psychoactive substance abuse, in remission: Secondary | ICD-10-CM

## 2013-06-13 DIAGNOSIS — E559 Vitamin D deficiency, unspecified: Secondary | ICD-10-CM

## 2013-06-13 DIAGNOSIS — M5380 Other specified dorsopathies, site unspecified: Secondary | ICD-10-CM

## 2013-06-13 DIAGNOSIS — M542 Cervicalgia: Secondary | ICD-10-CM | POA: Insufficient documentation

## 2013-06-13 DIAGNOSIS — E781 Pure hyperglyceridemia: Secondary | ICD-10-CM

## 2013-06-13 DIAGNOSIS — IMO0001 Reserved for inherently not codable concepts without codable children: Secondary | ICD-10-CM

## 2013-06-13 DIAGNOSIS — F1910 Other psychoactive substance abuse, uncomplicated: Secondary | ICD-10-CM

## 2013-06-13 DIAGNOSIS — Z79899 Other long term (current) drug therapy: Secondary | ICD-10-CM | POA: Insufficient documentation

## 2013-06-13 DIAGNOSIS — F17200 Nicotine dependence, unspecified, uncomplicated: Secondary | ICD-10-CM | POA: Insufficient documentation

## 2013-06-13 DIAGNOSIS — G8929 Other chronic pain: Secondary | ICD-10-CM

## 2013-06-13 DIAGNOSIS — M62830 Muscle spasm of back: Secondary | ICD-10-CM

## 2013-06-13 MED ORDER — AMLODIPINE BESYLATE 10 MG PO TABS: 10.0000 mg | ORAL_TABLET | Freq: Every day | ORAL | Status: DC

## 2013-06-13 MED ORDER — DICLOFENAC SODIUM 75 MG PO TBEC: 75.0000 mg | DELAYED_RELEASE_TABLET | Freq: Two times a day (BID) | ORAL | Status: DC

## 2013-06-13 MED ORDER — METHOCARBAMOL 500 MG PO TABS: 500.0000 mg | ORAL_TABLET | Freq: Every day | ORAL | Status: DC

## 2013-06-13 NOTE — Progress Notes (Signed)
  MRN: [id number] Name: Charlene Allen  Sex: female Age: 57 y.o. DOB: 03/12/1956  Allergies: Review of patient's allergies indicates no known allergies.  Chief Complaint  Patient presents with  . Back Pain    HPI: Patient is 57 y.o. female who has history of hypertension chronic neck pain back pain comes today for followup her, she had a blood work done which was reviewed with the patient, noticed vitamin D deficiency as well as hypertriglyceridemia patient has already been advised to start taking OTC vitamin D, modify the diet, for pain she has tried naproxen as per patient didn't help much also tried Flexeril for muscle spasm. Patient has history of substance abuse following up with methadone clinic.  History reviewed. No pertinent past medical history.  Past Surgical History  Procedure Laterality Date  . Tubal ligation    . Hand surgery        Medication List       This list is accurate as of: 06/13/13  9:33 AM.  Always use your most recent med list.               amLODipine 10 MG tablet  Commonly known as:  NORVASC  Take 1 tablet (10 mg total) by mouth daily.     cloNIDine 0.1 MG tablet  Commonly known as:  CATAPRES  Take 1 tablet (0.1 mg total) by mouth 2 (two) times daily.     diclofenac 75 MG EC tablet  Commonly known as:  VOLTAREN  Take 1 tablet (75 mg total) by mouth 2 (two) times daily.     methocarbamol 500 MG tablet  Commonly known as:  ROBAXIN  Take 1 tablet (500 mg total) by mouth at bedtime.     nicotine 21 mg/24hr patch  Commonly known as:  NICODERM CQ  Place 1 patch (21 mg total) onto the skin daily.        Meds ordered this encounter  Medications  . amLODipine (NORVASC) 10 MG tablet    Sig: Take 1 tablet (10 mg total) by mouth daily.    Dispense:  90 tablet    Refill:  3  . methocarbamol (ROBAXIN) 500 MG tablet    Sig: Take 1 tablet (500 mg total) by mouth at bedtime.    Dispense:  30 tablet    Refill:  2  . diclofenac (VOLTAREN)  75 MG EC tablet    Sig: Take 1 tablet (75 mg total) by mouth 2 (two) times daily.    Dispense:  60 tablet    Refill:  3     There is no immunization history on file for this patient.  Family History  Problem Relation Age of Onset  . COPD Mother   . Stroke Mother   . Heart disease Mother   . Hypertension Mother   . Cancer Sister 25    cervical cancer   . Stroke Maternal Grandmother     History  Substance Use Topics  . Smoking status: Heavy Tobacco Smoker -- 1.00 packs/day for 40 years  . Smokeless tobacco: Not on file  . Alcohol Use: No    Review of Systems   As noted in HPI  Filed Vitals:   06/13/13 0915  BP: 157/94  Pulse: 68  Temp: 98.4 F (36.9 C)  Resp: 16    Physical Exam  Physical Exam  Constitutional: No distress.  Eyes: EOM are normal. Pupils are equal, round, and reactive to light.  Cardiovascular: Normal rate and regular rhythm.     Pulmonary/Chest: Breath sounds normal. No respiratory distress. She has no wheezes. She has no rales.  Musculoskeletal:  Lower lumbar left paraspinal tenderness SLR test negative equal strength both lower studies DTR 2+     CBC    Component Value Date/Time   WBC 10.1 03/15/2013 1003   RBC 4.94 03/15/2013 1003   HGB 14.1 03/15/2013 1003   HCT 40.8 03/15/2013 1003   PLT 337 03/15/2013 1003   MCV 82.6 03/15/2013 1003   LYMPHSABS 3.0 03/15/2013 1003   MONOABS 0.6 03/15/2013 1003   EOSABS 0.2 03/15/2013 1003   BASOSABS 0.0 03/15/2013 1003    CMP     Component Value Date/Time   NA 141 03/15/2013 1003   K 4.5 03/15/2013 1003   CL 104 03/15/2013 1003   CO2 27 03/15/2013 1003   GLUCOSE 79 03/15/2013 1003   BUN 7 03/15/2013 1003   CREATININE 0.71 03/15/2013 1003   CREATININE 0.76 01/14/2009 0952   CALCIUM 9.4 03/15/2013 1003   PROT 6.7 03/15/2013 1003   ALBUMIN 4.1 03/15/2013 1003   AST 16 03/15/2013 1003   ALT 17 03/15/2013 1003   ALKPHOS 85 03/15/2013 1003   BILITOT 0.4 03/15/2013 1003   GFRNONAA >60  01/14/2009 0952   GFRAA  Value: >60        The eGFR has been calculated using the MDRD equation. This calculation has not been validated in all clinical situations. eGFR's persistently <60 mL/min signify possible Chronic Kidney Disease. 01/14/2009 0952    Lab Results  Component Value Date/Time   CHOL 151 03/15/2013 10:03 AM    No components found with this basename: hga1c    Lab Results  Component Value Date/Time   AST 16 03/15/2013 10:03 AM    Assessment and Plan  Essential hypertension, benign uncontrolled - Plan: Advised for DASH diet and her increase her Norvasc to 10 mg daily amLODipine (NORVASC) 10 MG tablet  Unspecified vitamin D deficiency OTC vitamin D supplement  Hypertriglyceridemia Low-fat diet.  Back muscle spasm - Plan: Patient will try Robaxin  methocarbamol (ROBAXIN) 500 MG tablet  History of substance abuse Following up with methadone clinic.   Chronic neck pain - Plan: diclofenac (VOLTAREN) 75 MG EC tablet  Chronic back pain - Plan: diclofenac (VOLTAREN) 75 MG EC tablet  Smoking Patient is advised to quit smoking.   Return in about 3 months (around 09/13/2013) for hypertension.  ADVANI, DEEPAK, MD   

## 2013-06-13 NOTE — Patient Instructions (Addendum)
DASH Diet  The DASH diet stands for "Dietary Approaches to Stop Hypertension." It is a healthy eating plan that has been shown to reduce high blood pressure (hypertension) in as little as 14 days, while also possibly providing other significant health benefits. These other health benefits include reducing the risk of breast cancer after menopause and reducing the risk of type 2 diabetes, heart disease, colon cancer, and stroke. Health benefits also include weight loss and slowing kidney failure in patients with chronic kidney disease.   DIET GUIDELINES  · Limit salt (sodium). Your diet should contain less than 1500 mg of sodium daily.  · Limit refined or processed carbohydrates. Your diet should include mostly whole grains. Desserts and added sugars should be used sparingly.  · Include small amounts of heart-healthy fats. These types of fats include nuts, oils, and tub margarine. Limit saturated and trans fats. These fats have been shown to be harmful in the body.  CHOOSING FOODS   The following food groups are based on a 2000 calorie diet. See your Registered Dietitian for individual calorie needs.  Grains and Grain Products (6 to 8 servings daily)  · Eat More Often: Whole-wheat bread, brown rice, whole-grain or wheat pasta, quinoa, popcorn without added fat or salt (air popped).  · Eat Less Often: White bread, white pasta, white rice, cornbread.  Vegetables (4 to 5 servings daily)  · Eat More Often: Fresh, frozen, and canned vegetables. Vegetables may be raw, steamed, roasted, or grilled with a minimal amount of fat.  · Eat Less Often/Avoid: Creamed or fried vegetables. Vegetables in a cheese sauce.  Fruit (4 to 5 servings daily)  · Eat More Often: All fresh, canned (in natural juice), or frozen fruits. Dried fruits without added sugar. One hundred percent fruit juice (½ cup [237 mL] daily).  · Eat Less Often: Dried fruits with added sugar. Canned fruit in light or heavy syrup.  Lean Meats, Fish, and Poultry (2  servings or less daily. One serving is 3 to 4 oz [85-114 g]).  · Eat More Often: Ninety percent or leaner ground beef, tenderloin, sirloin. Round cuts of beef, chicken breast, turkey breast. All fish. Grill, bake, or broil your meat. Nothing should be fried.  · Eat Less Often/Avoid: Fatty cuts of meat, turkey, or chicken leg, thigh, or wing. Fried cuts of meat or fish.  Dairy (2 to 3 servings)  · Eat More Often: Low-fat or fat-free milk, low-fat plain or light yogurt, reduced-fat or part-skim cheese.  · Eat Less Often/Avoid: Milk (whole, 2%). Whole milk yogurt. Full-fat cheeses.  Nuts, Seeds, and Legumes (4 to 5 servings per week)  · Eat More Often: All without added salt.  · Eat Less Often/Avoid: Salted nuts and seeds, canned beans with added salt.  Fats and Sweets (limited)  · Eat More Often: Vegetable oils, tub margarines without trans fats, sugar-free gelatin. Mayonnaise and salad dressings.  · Eat Less Often/Avoid: Coconut oils, palm oils, butter, stick margarine, cream, half and half, cookies, candy, pie.  FOR MORE INFORMATION  The Dash Diet Eating Plan: www.dashdiet.org  Document Released: 03/04/2011 Document Revised: 06/07/2011 Document Reviewed: 03/04/2011  [employer]® Patient Information ©2014 [employer].  Fat and Cholesterol Control Diet  Fat and cholesterol levels in your blood and organs are influenced by your diet. High levels of fat and cholesterol may lead to diseases of the heart, small and large blood vessels, gallbladder, liver, and pancreas.  CONTROLLING FAT AND CHOLESTEROL WITH DIET  Although exercise and lifestyle factors   replace saturated and trans fat with other types of fat, such as monounsaturated fat, polyunsaturated fat, and omega-3 fatty acids. On average, a person should consume no  more than 15 to 17 g of saturated fat daily. Saturated and trans fats are considered "bad" fats, and they will raise LDL cholesterol. Saturated fats are primarily found in animal products such as meats, butter, and cream. However, that does not mean you need to give up all your favorite foods. Today, there are good tasting, low-fat, low-cholesterol substitutes for most of the things you like to eat. Choose low-fat or nonfat alternatives. Choose round or loin cuts of red meat. These types of cuts are lowest in fat and cholesterol. Chicken (without the skin), fish, veal, and ground [place] breast are great choices. Eliminate fatty meats, such as hot dogs and salami. Even shellfish have little or no saturated fat. Have a 3 oz (85 g) portion when you eat lean meat, poultry, or fish. Trans fats are also called "partially hydrogenated oils." They are oils that have been scientifically manipulated so that they are solid at room temperature resulting in a longer shelf life and improved taste and texture of foods in which they are added. Trans fats are found in stick margarine, some tub margarines, cookies, crackers, and baked goods.  When baking and cooking, oils are a great substitute for butter. The monounsaturated oils are especially beneficial since it is believed they lower LDL and raise HDL. The oils you should avoid entirely are saturated tropical oils, such as coconut and palm.  Remember to eat a lot from food groups that are naturally free of saturated and trans fat, including fish, fruit, vegetables, beans, grains (barley, rice, couscous, bulgur wheat), and pasta (without cream sauces).  IDENTIFYING FOODS THAT LOWER FAT AND CHOLESTEROL  Soluble fiber may lower your cholesterol. This type of fiber is found in fruits such as apples, vegetables such as broccoli, potatoes, and carrots, legumes such as beans, peas, and lentils, and grains such as barley. Foods fortified with plant sterols (phytosterol) may also  lower cholesterol. You should eat at least 2 g per day of these foods for a cholesterol lowering effect.  Read package labels to identify low-saturated fats, trans fat free, and low-fat foods at the supermarket. Select cheeses that have only 2 to 3 g saturated fat per ounce. Use a heart-healthy tub margarine that is free of trans fats or partially hydrogenated oil. When buying baked goods (cookies, crackers), avoid partially hydrogenated oils. Breads and muffins should be made from whole grains (whole-wheat or whole oat flour, instead of "flour" or "enriched flour"). Buy non-creamy canned soups with reduced salt and no added fats.  FOOD PREPARATION TECHNIQUES  Never deep-fry. If you must fry, either stir-fry, which uses very little fat, or use non-stick cooking sprays. When possible, broil, bake, or roast meats, and steam vegetables. Instead of putting butter or margarine on vegetables, use lemon and herbs, applesauce, and cinnamon (for squash and sweet potatoes). Use nonfat yogurt, salsa, and low-fat dressings for salads.  LOW-SATURATED FAT / LOW-FAT FOOD SUBSTITUTES Meats / Saturated Fat (g)  Avoid: Steak, marbled (3 oz/85 g) / 11 g  Choose: Steak, lean (3 oz/85 g) / 4 g  Avoid: Hamburger (3 oz/85 g) / 7 g  Choose: Hamburger, lean (3 oz/85 g) / 5 g  Avoid: Ham (3 oz/85 g) / 6 g  Choose: Ham, lean cut (3 oz/85 g) / 2.4 g  Avoid: Chicken, with skin, dark meat (3 oz/85  g) / 4 g  Choose: Chicken, skin removed, dark meat (3 oz/85 g) / 2 g  Avoid: Chicken, with skin, light meat (3 oz/85 g) / 2.5 g  Choose: Chicken, skin removed, light meat (3 oz/85 g) / 1 g Dairy / Saturated Fat (g)  Avoid: Whole milk (1 cup) / 5 g  Choose: Low-fat milk, 2% (1 cup) / 3 g  Choose: Low-fat milk, 1% (1 cup) / 1.5 g  Choose: Skim milk (1 cup) / 0.3 g  Avoid: Hard cheese (1 oz/28 g) / 6 g  Choose: Skim milk cheese (1 oz/28 g) / 2 to 3 g  Avoid: Cottage cheese, 4% fat (1 cup) / 6.5 g  Choose: Low-fat  cottage cheese, 1% fat (1 cup) / 1.5 g  Avoid: Ice cream (1 cup) / 9 g  Choose: Sherbet (1 cup) / 2.5 g  Choose: Nonfat frozen yogurt (1 cup) / 0.3 g  Choose: Frozen fruit bar / trace  Avoid: Whipped cream (1 tbs) / 3.5 g  Choose: Nondairy whipped topping (1 tbs) / 1 g Condiments / Saturated Fat (g)  Avoid: Mayonnaise (1 tbs) / 2 g  Choose: Low-fat mayonnaise (1 tbs) / 1 g  Avoid: Butter (1 tbs) / 7 g  Choose: Extra light margarine (1 tbs) / 1 g  Avoid: Coconut oil (1 tbs) / 11.8 g  Choose: Olive oil (1 tbs) / 1.8 g  Choose: Corn oil (1 tbs) / 1.7 g  Choose: Safflower oil (1 tbs) / 1.2 g  Choose: Sunflower oil (1 tbs) / 1.4 g  Choose: Soybean oil (1 tbs) / 2.4 g  Choose: Canola oil (1 tbs) / 1 g Document Released: 03/15/2005 Document Revised: 07/10/2012 Document Reviewed: 09/03/2010 [employer] Patient Information 2014 [place], [place].

## 2013-06-13 NOTE — Progress Notes (Signed)
Patient complains of lower back pain Muscle contractions in her back Right side of her neck gets numb at times

## 2013-08-09 ENCOUNTER — Other Ambulatory Visit: Payer: Self-pay | Admitting: Internal Medicine

## 2013-08-09 DIAGNOSIS — I10 Essential (primary) hypertension: Secondary | ICD-10-CM

## 2013-08-16 ENCOUNTER — Encounter: Payer: Self-pay | Admitting: Internal Medicine

## 2013-08-16 ENCOUNTER — Ambulatory Visit: Payer: Self-pay | Attending: Internal Medicine | Admitting: Internal Medicine

## 2013-08-16 VITALS — BP 155/81 | HR 62 | Temp 97.5°F | Resp 16 | Ht 63.5 in | Wt 195.6 lb

## 2013-08-16 DIAGNOSIS — M542 Cervicalgia: Secondary | ICD-10-CM

## 2013-08-16 DIAGNOSIS — N925 Other specified irregular menstruation: Secondary | ICD-10-CM

## 2013-08-16 DIAGNOSIS — N926 Irregular menstruation, unspecified: Secondary | ICD-10-CM | POA: Insufficient documentation

## 2013-08-16 DIAGNOSIS — F17200 Nicotine dependence, unspecified, uncomplicated: Secondary | ICD-10-CM | POA: Insufficient documentation

## 2013-08-16 DIAGNOSIS — N949 Unspecified condition associated with female genital organs and menstrual cycle: Secondary | ICD-10-CM

## 2013-08-16 DIAGNOSIS — G8929 Other chronic pain: Secondary | ICD-10-CM

## 2013-08-16 DIAGNOSIS — N938 Other specified abnormal uterine and vaginal bleeding: Secondary | ICD-10-CM

## 2013-08-16 DIAGNOSIS — M549 Dorsalgia, unspecified: Secondary | ICD-10-CM | POA: Insufficient documentation

## 2013-08-16 DIAGNOSIS — R519 Headache, unspecified: Secondary | ICD-10-CM

## 2013-08-16 DIAGNOSIS — R51 Headache: Secondary | ICD-10-CM

## 2013-08-16 DIAGNOSIS — I10 Essential (primary) hypertension: Secondary | ICD-10-CM | POA: Insufficient documentation

## 2013-08-16 DIAGNOSIS — Z79899 Other long term (current) drug therapy: Secondary | ICD-10-CM | POA: Insufficient documentation

## 2013-08-16 MED ORDER — GABAPENTIN 100 MG PO CAPS: 100.0000 mg | ORAL_CAPSULE | Freq: Three times a day (TID) | ORAL | Status: DC

## 2013-08-16 NOTE — Patient Instructions (Signed)
Cut back on diclofenac pills, they may increase your blood pressure. May continue taking Robaxin as needed for muscle spasms.   Back Pain, Adult Low back pain is very common. About 1 in 5 people have back pain.The cause of low back pain is rarely dangerous. The pain often gets better over time.About half of people with a sudden onset of back pain feel better in just 2 weeks. About 8 in 10 people feel better by 6 weeks.  CAUSES Some common causes of back pain include:  Strain of the muscles or ligaments supporting the spine.  Wear and tear (degeneration) of the spinal discs.  Arthritis.  Direct injury to the back. DIAGNOSIS Most of the time, the direct cause of low back pain is not known.However, back pain can be treated effectively even when the exact cause of the pain is unknown.Answering your caregiver's questions about your overall health and symptoms is one of the most accurate ways to make sure the cause of your pain is not dangerous. If your caregiver needs more information, he or she may order lab work or imaging tests (X-rays or MRIs).However, even if imaging tests show changes in your back, this usually does not require surgery. HOME CARE INSTRUCTIONS For many people, back pain returns.Since low back pain is rarely dangerous, it is often a condition that people can learn to [facility] their own.   Remain active. It is stressful on the back to sit or stand in one place. Do not sit, drive, or stand in one place for more than 30 minutes at a time. Take short walks on level surfaces as soon as pain allows.Try to increase the length of time you walk each day.  Do not stay in bed.Resting more than 1 or 2 days can delay your recovery.  Do not avoid exercise or work.Your body is made to move.It is not dangerous to be active, even though your back may hurt.Your back will likely heal faster if you return to being active before your pain is gone.  Pay attention to your body when you  bend and lift. Many people have less discomfortwhen lifting if they bend their knees, keep the load close to their bodies,and avoid twisting. Often, the most comfortable positions are those that put less stress on your recovering back.  Find a comfortable position to sleep. Use a firm mattress and lie on your side with your knees slightly bent. If you lie on your back, put a pillow under your knees.  Only take over-the-counter or prescription medicines as directed by your caregiver. Over-the-counter medicines to reduce pain and inflammation are often the most helpful.Your caregiver may prescribe muscle relaxant drugs.These medicines help dull your pain so you can more quickly return to your normal activities and healthy exercise.  Put ice on the injured area.  Put ice in a plastic bag.  Place a towel between your skin and the bag.  Leave the ice on for 15-20 minutes, 03-04 times a day for the first 2 to 3 days. After that, ice and heat may be alternated to reduce pain and spasms.  Ask your caregiver about trying back exercises and gentle massage. This may be of some benefit.  Avoid feeling anxious or stressed.Stress increases muscle tension and can worsen back pain.It is important to recognize when you are anxious or stressed and learn ways to manage it.Exercise is a great option. SEEK MEDICAL CARE IF:  You have pain that is not relieved with rest or medicine.  You  have pain that does not improve in 1 week.  You have new symptoms.  You are generally not feeling well. SEEK IMMEDIATE MEDICAL CARE IF:   You have pain that radiates from your back into your legs.  You develop new bowel or bladder control problems.  You have unusual weakness or numbness in your arms or legs.  You develop nausea or vomiting.  You develop abdominal pain.  You feel faint. Document Released: 03/15/2005 Document Revised: 09/14/2011 Document Reviewed: 08/03/2010 [facility] Patient Information 2014  [place], [place].

## 2013-08-16 NOTE — Progress Notes (Signed)
Patient is here to F/U for 2 month history of neck pain and numbness.  States pain has worsened over last 3 weeks and is now causing headaches. States she had not had a menses for 8 years but has been having irregular periods for last 5-6 months.

## 2013-08-16 NOTE — Progress Notes (Signed)
Patient ID: Charlene Allen, female   DOB: Jul 20, 1955, 58 y.o.   MRN: [id number]  CC: neck pain, vaginal bleeding  HPI:  Patient is 58 y.o. female who comes today to be evaluated for neck pain and abnormal vaginal bleeding. She has a history of polysubstance abuse and is currently enrolled in a methadone program. She reports chronic neck and back pain for number of years. She reports pain on the right side of her neck that feels numb and causes her to have headaches.  She is reporting daily headaches with minimal photophobia, nausea, and vomiting. She has tried naproxen, diclofenac, Robaxin, and Flexeril with no relief. Patient denies any shooting pain down legs, chest pain, shortness of breath, bowel or bladder dysfunction. Patient also reports that she was without a menstrual cycle for 8 years and for the past 6 months she has been having abnormal vaginal bleeding.  She reports it started off as spotting and now she's bleeding daily. The blood is not associated with sexual intercourse. Reports the blood is bright red and sometimes heavy. Patient has not seen a gynecologist.      No Known Allergies Past Medical History  Diagnosis Date  . Hypertension    Current Outpatient Prescriptions on File Prior to Visit  Medication Sig Dispense Refill  . amLODipine (NORVASC) 10 MG tablet Take 1 tablet (10 mg total) by mouth daily.  90 tablet  3  . cloNIDine (CATAPRES) 0.1 MG tablet TAKE ONE TABLET BY MOUTH TWICE DAILY  60 tablet  0  . diclofenac (VOLTAREN) 75 MG EC tablet Take 1 tablet (75 mg total) by mouth 2 (two) times daily.  60 tablet  3  . methocarbamol (ROBAXIN) 500 MG tablet Take 1 tablet (500 mg total) by mouth at bedtime.  30 tablet  2  . nicotine (NICODERM CQ) 21 mg/24hr patch Place 1 patch (21 mg total) onto the skin daily.  28 patch  0   No current facility-administered medications on file prior to visit.   Family History  Problem Relation Age of Onset  . COPD Mother   . Stroke Mother    . Heart disease Mother   . Hypertension Mother   . Cancer Sister 25    cervical cancer   . Stroke Maternal Grandmother    History   Social History  . Marital Status: Married    Spouse Name: N/A    Number of Children: N/A  . Years of Education: N/A   Occupational History  . Not on file.   Social History Main Topics  . Smoking status: Heavy Tobacco Smoker -- 1.00 packs/day for 40 years  . Smokeless tobacco: Not on file  . Alcohol Use: No  . Drug Use: Not on file  . Sexual Activity: Not on file   Other Topics Concern  . Not on file   Social History Narrative  . No narrative on file   Review of Systems  Constitutional: Negative for fever and chills.  Eyes: Positive for photophobia.  Respiratory: Negative.   Cardiovascular: Negative.   Gastrointestinal: Positive for nausea and vomiting.  Genitourinary:       Irregular bleeding for 6 months.   Neurological: Positive for tingling (left neck) and headaches. Negative for dizziness, sensory change, speech change and seizures.     Objective:   Filed Vitals:   08/16/13 0904  BP: 155/81  Pulse: 62  Temp: 97.5 F (36.4 C)  Resp: 16   Physical Exam  Vitals reviewed. Constitutional: She is oriented  to person, place, and time. She appears well-developed.  Neck: Normal range of motion. Neck supple.  No tenderness   Cardiovascular: Normal rate, regular rhythm and normal heart sounds.   Pulmonary/Chest: Effort normal and breath sounds normal.  Abdominal: Soft. Bowel sounds are normal.  Musculoskeletal: Normal range of motion. She exhibits no tenderness.  No spinal tenderness   Neurological: She is alert and oriented to person, place, and time. She has normal reflexes.  Psychiatric: She has a normal mood and affect.     Lab Results  Component Value Date   WBC 10.1 03/15/2013   HGB 14.1 03/15/2013   HCT 40.8 03/15/2013   MCV 82.6 03/15/2013   PLT 337 03/15/2013   Lab Results  Component Value Date    CREATININE 0.71 03/15/2013   BUN 7 03/15/2013   NA 141 03/15/2013   K 4.5 03/15/2013   CL 104 03/15/2013   CO2 27 03/15/2013    No results found for this basename: HGBA1C   Lipid Panel     Component Value Date/Time   CHOL 151 03/15/2013 1003   TRIG 183* 03/15/2013 1003   HDL 32* 03/15/2013 1003   CHOLHDL 4.7 03/15/2013 1003   VLDL 37 03/15/2013 1003   LDLCALC 82 03/15/2013 1003       Assessment and plan:   Charlene Allen was seen today for follow-up and neck pain.  Diagnoses and associated orders for this visit:  Neck pain - gabapentin (NEURONTIN) 100 MG capsule; Take 1 capsule (100 mg total) by mouth 3 (three) times daily. May also help with frequent headaches. May continue to take robaxin for muscle spasm. Told patient to cut back and eventually stop diclofenac, may increase BP. May use heating pads as well Continue f/u with methadone clinic for substance abuse. Chronic back pain  Other disorder of menstruation and other abnormal bleeding from female genital tract - Ambulatory referral to Gynecology       Charlene Allen, Charlene Allen and Charlene Allen [phone] 08/16/2013, 9:48 AM

## 2013-08-28 ENCOUNTER — Telehealth: Payer: Self-pay | Admitting: *Deleted

## 2013-08-28 NOTE — Telephone Encounter (Signed)
Patient calling in regards to GYN referral. Patient states she is till having vaginal bleeding. Informed patient PCP has put in the referral for GYN services. Provided patient with the phone number to [facility] [phone]. Patient states she will call them. Alverda Skeans, RN

## 2013-09-22 ENCOUNTER — Other Ambulatory Visit: Payer: Self-pay | Admitting: Internal Medicine

## 2013-10-03 ENCOUNTER — Encounter: Payer: Self-pay | Admitting: Internal Medicine

## 2013-10-03 ENCOUNTER — Ambulatory Visit: Payer: Self-pay | Attending: Internal Medicine | Admitting: Internal Medicine

## 2013-10-03 VITALS — BP 148/75 | HR 58 | Temp 98.0°F | Resp 20 | Ht 64.0 in | Wt 192.0 lb

## 2013-10-03 DIAGNOSIS — F17200 Nicotine dependence, unspecified, uncomplicated: Secondary | ICD-10-CM | POA: Insufficient documentation

## 2013-10-03 DIAGNOSIS — M542 Cervicalgia: Secondary | ICD-10-CM | POA: Insufficient documentation

## 2013-10-03 DIAGNOSIS — G8929 Other chronic pain: Secondary | ICD-10-CM | POA: Insufficient documentation

## 2013-10-03 DIAGNOSIS — I10 Essential (primary) hypertension: Secondary | ICD-10-CM | POA: Insufficient documentation

## 2013-10-03 MED ORDER — GABAPENTIN 300 MG PO CAPS: 300.0000 mg | ORAL_CAPSULE | Freq: Three times a day (TID) | ORAL | Status: DC

## 2013-10-03 MED ORDER — CLONIDINE HCL 0.1 MG PO TABS: ORAL_TABLET | ORAL | Status: DC

## 2013-10-03 NOTE — Progress Notes (Signed)
Patient presents for F/U on neck pain. States there has been no improvement; Rates 8/10 at present. States she ran out of clonidine 7 days ago.

## 2013-10-03 NOTE — Progress Notes (Signed)
Patient ID: Charlene Allen, female   DOB: 16-Apr-1955, 58 y.o.   MRN: [id number]  CC: chronic neck pain  HPI:  Patient presents today with chronic neck pain.  Patient reports that she has not had relief in pain since last visit.  She has been taking gabapentin, robaxin, icy hot, heating pad, and diclofenac.  Patient reports sharp/archy pain continues to radiate down bilateral arms.  She reports that she continues to have daily throbbing headaches and feels like she is unable to sit up striaght d/t pain.  She reports that the pain is so severe that it is causing her to limit her ADL's.  She reports that she takes a total of 110 mg of Methadone daily for opiod dependence and that still does not help her pain. She reports that she has a appointment on Friday with the GYN because of abnormal uterine bleeding.  Patient reports that she was without menses for 8 years and has started back bleeding for the past 6 months, with  continuous bleeding for 3 months.    No Known Allergies Past Medical History  Diagnosis Date  . Hypertension    Current Outpatient Prescriptions on File Prior to Visit  Medication Sig Dispense Refill  . amLODipine (NORVASC) 10 MG tablet Take 1 tablet (10 mg total) by mouth daily.  90 tablet  3  . gabapentin (NEURONTIN) 100 MG capsule Take 1 capsule (100 mg total) by mouth 3 (three) times daily.  90 capsule  1  . methocarbamol (ROBAXIN) 500 MG tablet Take 1 tablet (500 mg total) by mouth at bedtime.  30 tablet  2  . diclofenac (VOLTAREN) 75 MG EC tablet Take 1 tablet (75 mg total) by mouth 2 (two) times daily.  60 tablet  3  . naproxen (NAPROSYN) 500 MG tablet Take 500 mg by mouth 2 (two) times daily with a meal.      . nicotine (NICODERM CQ) 21 mg/24hr patch Place 1 patch (21 mg total) onto the skin daily.  28 patch  0   No current facility-administered medications on file prior to visit.   Family History  Problem Relation Age of Onset  . COPD Mother   . Stroke Mother   .  Heart disease Mother   . Hypertension Mother   . Cancer Sister 25    cervical cancer   . Stroke Maternal Grandmother    History   Social History  . Marital Status: Married    Spouse Name: N/A    Number of Children: N/A  . Years of Education: N/A   Occupational History  . Not on file.   Social History Main Topics  . Smoking status: Current Every Day Smoker -- 1.00 packs/day for 40 years  . Smokeless tobacco: Not on file  . Alcohol Use: No  . Drug Use: Not on file  . Sexual Activity: Not on file   Other Topics Concern  . Not on file   Social History Narrative  . No narrative on file    Review of Systems: Constitutional: Negative for fever, chills, diaphoresis, activity change, appetite change and fatigue. HENT: Negative for ear pain, nosebleeds, congestion, facial swelling, rhinorrhea, neck pain, neck stiffness and ear discharge.  Eyes: Negative for pain, discharge, redness, itching and visual disturbance. Respiratory: Negative for cough, choking, chest tightness, shortness of breath, wheezing and stridor.  Cardiovascular: Negative for chest pain, palpitations and leg swelling. Gastrointestinal: Negative for abdominal distention. Genitourinary: Negative for dysuria, urgency, frequency, hematuria, flank pain, decreased urine  volume, difficulty urinating and dyspareunia.  Musculoskeletal: Negative for back pain, joint swelling, arthralgias and gait problem. Neurological: Negative for dizziness, tremors, seizures, syncope, facial asymmetry, speech difficulty, weakness, light-headedness, numbness and headaches.  Hematological: Negative for adenopathy. Does not bruise/bleed easily. Psychiatric/Behavioral: Negative for hallucinations, behavioral problems, confusion, dysphoric mood, decreased concentration and agitation.    Objective:   Filed Vitals:   10/03/13 0934  BP: 148/75  Pulse: 58  Temp: 98 F (36.7 C)  Resp: 20    Physical Exam: Constitutional: Patient appears  well-developed and well-nourished. No distress. Neck: Normal ROM. Neck supple. No JVD. No tracheal deviation. No thyromegaly. CVS: RRR, S1/S2 +, no murmurs, no gallops, no carotid bruit.  Pulmonary: Effort and breath sounds normal, no stridor, rhonchi, wheezes, rales.  Abdominal: Soft. BS +,  no distension, tenderness, rebound or guarding.  Musculoskeletal: Normal range of motion. No edema. Right paraspinal tenderness and right trapezius muscle tenderness Lymphadenopathy: No lymphadenopathy noted, cervical, inguinal or axillary Neuro: Alert. Normal reflexes, muscle tone coordination. No cranial nerve deficit. Skin: Skin is warm and dry. No rash noted. Not diaphoretic. No erythema. No pallor. Psychiatric: Normal mood and affect. Behavior, judgment, thought content normal.  Lab Results  Component Value Date   WBC 10.1 03/15/2013   HGB 14.1 03/15/2013   HCT 40.8 03/15/2013   MCV 82.6 03/15/2013   PLT 337 03/15/2013   Lab Results  Component Value Date   CREATININE 0.71 03/15/2013   BUN 7 03/15/2013   NA 141 03/15/2013   K 4.5 03/15/2013   CL 104 03/15/2013   CO2 27 03/15/2013    No results found for this basename: HGBA1C   Lipid Panel     Component Value Date/Time   CHOL 151 03/15/2013 1003   TRIG 183* 03/15/2013 1003   HDL 32* 03/15/2013 1003   CHOLHDL 4.7 03/15/2013 1003   VLDL 37 03/15/2013 1003   LDLCALC 82 03/15/2013 1003       Assessment and plan:   Charlene Allen was seen today for follow-up and neck pain.  Diagnoses and associated orders for this visit:  Essential hypertension - cloNIDine (CATAPRES) 0.1 MG tablet; TAKE ONE TABLET BY MOUTH TWICE DAILY  Chronic neck pain - Ambulatory referral to Sports Medicine - Increased to gabapentin (NEURONTIN) 300 MG capsule; Take 1 capsule (300 mg total) by mouth 3 (three) times daily.   Return in about 3 months (around 01/03/2014) for Hypertension.    [name], [place] [facility] [phone] 10/03/2013, 9:59 AM

## 2013-10-03 NOTE — Patient Instructions (Signed)
Degenerative Disk Disease Degenerative disk disease is a condition caused by the changes that occur in the cushions of the backbone (spinal disks) as you grow older. Spinal disks are soft and compressible disks located between the bones of the spine (vertebrae). They act like shock absorbers. Degenerative disk disease can affect the whole spine. However, the neck and lower back are most commonly affected. Many changes can occur in the spinal disks with aging, such as:  The spinal disks may dry and shrink.  Small tears may occur in the tough, outer covering of the disk (annulus).  The disk space may become smaller due to loss of water.  Abnormal growths in the bone (spurs) may occur. This can put pressure on the nerve roots exiting the spinal canal, causing pain.  The spinal canal may become narrowed. CAUSES  Degenerative disk disease is a condition caused by the changes that occur in the spinal disks with aging. The exact cause is not known, but there is a genetic basis for many patients. Degenerative changes can occur due to loss of fluid in the disk. This makes the disk thinner and reduces the space between the backbones. Small cracks can develop in the outer layer of the disk. This can lead to the breakdown of the disk. You are more likely to get degenerative disk disease if you are overweight. Smoking cigarettes and doing heavy work such as weightlifting can also increase your risk of this condition. Degenerative changes can start after a sudden injury. Growth of bone spurs can compress the nerve roots and cause pain.  SYMPTOMS  The symptoms vary from person to person. Some people may have no pain, while others have severe pain. The pain may be so severe that it can limit your activities. The location of the pain depends on the part of your backbone that is affected. You will have neck or arm pain if a disk in the neck area is affected. You will have pain in your back, buttocks, or legs if a disk  in the lower back is affected. The pain becomes worse while bending, reaching up, or with twisting movements. The pain may start gradually and then get worse as time passes. It may also start after a major or minor injury. You may feel numbness or tingling in the arms or legs.  DIAGNOSIS  Your caregiver will ask you about your symptoms and about activities or habits that may cause the pain. He or she may also ask about any injuries, diseases, or treatments you have had earlier. Your caregiver will examine you to check for the range of movement that is possible in the affected area, to check for strength in your extremities, and to check for sensation in the areas of the arms and legs supplied by different nerve roots. An X-ray of the spine may be taken. Your caregiver may suggest other imaging tests, such as magnetic resonance imaging (MRI), if needed.  TREATMENT  Treatment includes rest, modifying your activities, and applying ice and heat. Your caregiver may prescribe medicines to reduce your pain and may ask you to do some exercises to strengthen your back. In some cases, you may need surgery. You and your caregiver will decide on the treatment that is best for you. HOME CARE INSTRUCTIONS   Follow proper lifting and walking techniques as advised by your caregiver.  Maintain good posture.  Exercise regularly as advised.  Perform relaxation exercises.  Change your sitting, standing, and sleeping habits as advised. Change positions   frequently. °· Lose weight as advised. °· Stop smoking if you smoke. °· Wear supportive footwear. °SEEK MEDICAL CARE IF:  °Your pain does not go away within 1 to 4 weeks. °SEEK IMMEDIATE MEDICAL CARE IF:  °· Your pain is severe. °· You notice weakness in your arms, hands, or legs. °· You begin to lose control of your bladder or bowel movements. °MAKE SURE YOU:  °· Understand these instructions. °· Will watch your condition. °· Will get help right away if you are not doing  well or get worse. °Document Released: 01/10/2007 Document Revised: 06/07/2011 Document Reviewed: 01/10/2007 °[employer]® Patient Information ©2015 [employer]. This information is not intended to replace advice given to you by your health care provider. Make sure you discuss any questions you have with your health care provider. ° °

## 2013-10-05 ENCOUNTER — Ambulatory Visit (INDEPENDENT_AMBULATORY_CARE_PROVIDER_SITE_OTHER): Payer: Self-pay | Admitting: Obstetrics & Gynecology

## 2013-10-05 ENCOUNTER — Other Ambulatory Visit (HOSPITAL_COMMUNITY)
Admission: RE | Admit: 2013-10-05 | Discharge: 2013-10-05 | Disposition: A | Discharge status: Home / Self Care | Payer: Self-pay | Source: Ambulatory Visit | Attending: Obstetrics & Gynecology | Admitting: Obstetrics & Gynecology

## 2013-10-05 ENCOUNTER — Encounter: Payer: Self-pay | Admitting: Obstetrics & Gynecology

## 2013-10-05 VITALS — BP 148/88 | HR 65 | Ht 64.0 in | Wt 189.9 lb

## 2013-10-05 DIAGNOSIS — Z124 Encounter for screening for malignant neoplasm of cervix: Secondary | ICD-10-CM | POA: Insufficient documentation

## 2013-10-05 DIAGNOSIS — N950 Postmenopausal bleeding: Secondary | ICD-10-CM | POA: Insufficient documentation

## 2013-10-05 DIAGNOSIS — Z0000 Encounter for general adult medical examination without abnormal findings: Secondary | ICD-10-CM

## 2013-10-05 LAB — TSH: TSH: 2.408 u[IU]/mL (ref 0.350–4.500)

## 2013-10-05 NOTE — Progress Notes (Signed)
Patient stopped periods for 8 years and for the past 6 months has been bleeding.

## 2013-10-05 NOTE — Progress Notes (Signed)
   Subjective:    Patient ID: Charlene Allen, female    DOB: 1955-09-17, 58 y.o.   MRN: [id number]  HPI  This 58 yo lady is here for evaluation of a 6 month h/o PMB. She had her PMP 8 years ago and started with PMB as stated. It has been "heavy" for the last 3 months. She has been abstinent of the last 2 years.  Review of Systems Her pap and mammogram are due.    Objective:   Physical Exam   UPT negative, consent signed, time out done Cervix prepped with betadine  Uterus sounded to 9 cm Pipelle used for 2 passes with a moderate amount of tissue obtained. She tolerated the procedure well.  There appeared to be some unusual tissue at the 3 o'clock position of the endocervix but I was not able to visualize it completely so I will have another gynecologist re check this in a week when she returns for her results.      Assessment & Plan:  PMB- await EMBX results Await pap results Recheck of possible cervical lesion in 1 week

## 2013-10-09 LAB — CYTOLOGY - PAP

## 2013-10-11 ENCOUNTER — Ambulatory Visit (HOSPITAL_COMMUNITY)
Admission: RE | Admit: 2013-10-11 | Discharge: 2013-10-11 | Disposition: A | Discharge status: Home / Self Care | Payer: Self-pay | Source: Ambulatory Visit | Attending: Obstetrics & Gynecology | Admitting: Obstetrics & Gynecology

## 2013-10-11 DIAGNOSIS — N950 Postmenopausal bleeding: Secondary | ICD-10-CM | POA: Insufficient documentation

## 2013-11-16 ENCOUNTER — Ambulatory Visit: Payer: Self-pay | Admitting: Obstetrics & Gynecology

## 2013-11-20 ENCOUNTER — Encounter: Payer: Self-pay | Admitting: General Practice

## 2013-12-26 ENCOUNTER — Ambulatory Visit: Payer: Self-pay | Attending: Internal Medicine | Admitting: Internal Medicine

## 2013-12-26 ENCOUNTER — Encounter: Payer: Self-pay | Admitting: Internal Medicine

## 2013-12-26 VITALS — BP 162/81 | HR 64 | Temp 98.4°F | Resp 16 | Ht 63.5 in | Wt 194.0 lb

## 2013-12-26 DIAGNOSIS — E669 Obesity, unspecified: Secondary | ICD-10-CM | POA: Insufficient documentation

## 2013-12-26 DIAGNOSIS — N950 Postmenopausal bleeding: Secondary | ICD-10-CM | POA: Insufficient documentation

## 2013-12-26 DIAGNOSIS — I10 Essential (primary) hypertension: Secondary | ICD-10-CM

## 2013-12-26 DIAGNOSIS — F17200 Nicotine dependence, unspecified, uncomplicated: Secondary | ICD-10-CM

## 2013-12-26 DIAGNOSIS — Z79899 Other long term (current) drug therapy: Secondary | ICD-10-CM | POA: Insufficient documentation

## 2013-12-26 DIAGNOSIS — Z8249 Family history of ischemic heart disease and other diseases of the circulatory system: Secondary | ICD-10-CM | POA: Insufficient documentation

## 2013-12-26 MED ORDER — CLONIDINE HCL 0.1 MG PO TABS: ORAL_TABLET | ORAL | Status: DC

## 2013-12-26 NOTE — Progress Notes (Signed)
Patient ID: Charlene Allen, female   DOB: [birth year], 57 y.o.   MRN: [id number]  CC: headache, neck pain  HPI:  Patient reports that she has had continued neck pain and headaches.  Headaches are daily usually beginning in the evenings lasting until she goes to sleep.  She reports the pain begins in her neck and radiates to the top of her head.  Headaches are a throbbing sensation.  Headaches and neck pain have been present for several years.  She reports that she fell two weeks ago getting out of bed. She states that she was extremely fatigued and fell out of the bed.  When she went back to the methadone clinic she was told that she needs to visit her PCP or she will not get a refill on her methadone.    goes to [facility]--Sees NP name Elberta Fortis  No Known Allergies Past Medical History  Diagnosis Date  . Hypertension   . Obesity   . PMB (postmenopausal bleeding)    Current Outpatient Prescriptions on File Prior to Visit  Medication Sig Dispense Refill  . amLODipine (NORVASC) 10 MG tablet Take 1 tablet (10 mg total) by mouth daily.  90 tablet  3  . cloNIDine (CATAPRES) 0.1 MG tablet TAKE ONE TABLET BY MOUTH TWICE DAILY  60 tablet  3  . gabapentin (NEURONTIN) 300 MG capsule Take 1 capsule (300 mg total) by mouth 3 (three) times daily.  90 capsule  3  . methadone (DOLOPHINE) 10 MG tablet Take 110 mg by mouth daily. Methadone clinic for opiate withdrawal      . diclofenac (VOLTAREN) 75 MG EC tablet Take 1 tablet (75 mg total) by mouth 2 (two) times daily.  60 tablet  3  . methocarbamol (ROBAXIN) 500 MG tablet Take 1 tablet (500 mg total) by mouth at bedtime.  30 tablet  2  . naproxen (NAPROSYN) 500 MG tablet Take 500 mg by mouth 2 (two) times daily with a meal.      . nicotine (NICODERM CQ) 21 mg/24hr patch Place 1 patch (21 mg total) onto the skin daily.  28 patch  0   No current facility-administered medications on file prior to visit.   Family History  Problem Relation Age of Onset  . COPD  Mother   . Stroke Mother   . Heart disease Mother   . Hypertension Mother   . Cancer Sister 25    cervical cancer   . Stroke Maternal Grandmother    History   Social History  . Marital Status: Married    Spouse Name: N/A    Number of Children: N/A  . Years of Education: N/A   Occupational History  . Not on file.   Social History Main Topics  . Smoking status: Current Every Day Smoker -- 1.00 packs/day for 40 years  . Smokeless tobacco: Not on file  . Alcohol Use: No  . Drug Use: Not on file  . Sexual Activity: Not Currently    Birth Control/ Protection: None   Other Topics Concern  . Not on file   Social History Narrative  . No narrative on file    Review of Systems: See HPI    Objective:   Filed Vitals:   12/26/13 1208  BP: 162/81  Pulse: 64  Temp: 98.4 F (36.9 C)  Resp: 16   Physical Exam  Constitutional: She is oriented to person, place, and time.  Eyes:  Pin point pupils   Cardiovascular: Normal rate, regular rhythm  and normal heart sounds.   Pulmonary/Chest: Effort normal and breath sounds normal.  Abdominal: Soft. Bowel sounds are normal.  Musculoskeletal: She exhibits tenderness (bilateral shoulder tenderness).  Neurological: She is alert and oriented to person, place, and time.  Skin: Skin is warm and dry.     Lab Results  Component Value Date   WBC 10.1 03/15/2013   HGB 14.1 03/15/2013   HCT 40.8 03/15/2013   MCV 82.6 03/15/2013   PLT 337 03/15/2013   Lab Results  Component Value Date   CREATININE 0.71 03/15/2013   BUN 7 03/15/2013   NA 141 03/15/2013   K 4.5 03/15/2013   CL 104 03/15/2013   CO2 27 03/15/2013    No results found for this basename: HGBA1C   Lipid Panel     Component Value Date/Time   CHOL 151 03/15/2013 1003   TRIG 183* 03/15/2013 1003   HDL 32* 03/15/2013 1003   CHOLHDL 4.7 03/15/2013 1003   VLDL 37 03/15/2013 1003   LDLCALC 82 03/15/2013 1003       Assessment and plan:   Charlene Allen was seen today  for follow-up.  Diagnoses and associated orders for this visit:  Essential hypertension - cloNIDine (CATAPRES) 0.1 MG tablet; TAKE ONE TABLET BY MOUTH THREE TIMES DAILY    Clonidine changed to TID and continue amlodipine  I do not believe the fall is cardiac related. The patient has presented twice to clinic in my opinion too medicated.  Tobacco use disorder Smoking cessation discussed in detail  I have contacted [facility] with my concerns  Return in about 1 week (around 01/02/2014) for Nurse Visit and 3 mo PCP.    Charlene Manning, NP-C [facility] [phone] 12/30/2013, 9:58 PM

## 2013-12-26 NOTE — Progress Notes (Signed)
Pt is here following up on her HTN and and chronic neck pain.

## 2013-12-26 NOTE — Patient Instructions (Signed)

## 2013-12-27 ENCOUNTER — Other Ambulatory Visit: Payer: Self-pay | Admitting: Internal Medicine

## 2013-12-27 ENCOUNTER — Encounter: Payer: Self-pay | Admitting: Internal Medicine

## 2013-12-27 NOTE — Progress Notes (Signed)
Patient ID: Charlene Allen, female   DOB: 09-19-1955, 58 y.o.   MRN: [id number]  Called over to [facility] and spoke with Less the coordinator of the Methadone clinic.  I did not have a waiver on the patients behalf that allowed him to confirm or deny Jacklyn Shell as a patient.  I explained to him that if she is a patient of his I have concerns about her Methadone dosage.  Explained to him that the two times I have seen patient she seemed zoned out and I believe that her recent fall was a result of Methadone use.  Encouraged the clinic to look further into her case and medication management.

## 2014-01-11 ENCOUNTER — Ambulatory Visit: Payer: Self-pay | Admitting: Obstetrics & Gynecology

## 2014-01-11 ENCOUNTER — Telehealth: Payer: Self-pay | Admitting: *Deleted

## 2014-01-11 NOTE — Telephone Encounter (Signed)
Pt left message stating that she had Bx and [place] in August and would like her results. She will not be able to make her scheduled appt.  *Per chart review, pt rescheduled today's appt to December

## 2014-01-15 NOTE — Telephone Encounter (Signed)
Patient left message that she is calling for her ultrasound and biopsy results.

## 2014-01-16 NOTE — Telephone Encounter (Signed)
Called pt and informed her of results ob Endometrial biopsy, Pap and [place]. She states she is continuing to have vaginal bleeding and is using approximately 4 pads daily. She asked what can be causing the bleeding. I advised that it is difficult to know exactly and so far her tests do not show any significant abnormality. She will need to keep her appt as scheduled in December to discuss plan of care options with the doctor. I offered pt an appt today as there was an open appt with Dr. Hulan Fray and she stated that she cannot get here today. I stated that pt may call back periodically to check for an opening sooner than her scheduled appt in December.  Pt voiced understanding of all information given.

## 2014-01-28 ENCOUNTER — Encounter: Payer: Self-pay | Admitting: Internal Medicine

## 2014-01-31 ENCOUNTER — Telehealth: Payer: Self-pay | Admitting: Internal Medicine

## 2014-01-31 ENCOUNTER — Other Ambulatory Visit: Payer: Self-pay | Admitting: Internal Medicine

## 2014-01-31 NOTE — Telephone Encounter (Signed)
Nurse Practitioner Elberta Fortis called wanting to speak to PCP about Pt being at [facility]. Please f/u.

## 2014-01-31 NOTE — Telephone Encounter (Signed)
Nurse Practitioner Elberta Fortis called from [facility] regarding care coordination.  He states that he met with patient today for her physical and methadone refill. He states that patient was coherent and alert today, without signs of sedation.  He performed repeat EKG which was WNL.  He notes that her BP was slightly elevated today.  He reports that he has decreased her Methadone to 105 mg and next week it will decrease by another 5 mg. He has concerns that the clonidine being increased to 0.1 mg TID and gabapentin 300 mg TID may increase sedation. I will have patient return for Nurse visit to check BP and other possible alternatives to BP management. We will continue to coordinate care with this patient for safety reasons.

## 2014-02-05 NOTE — Telephone Encounter (Signed)
Expand All Collapse All   Nurse Practitioner Elberta Fortis called from [facility] regarding care coordination. He states that he met with patient today for her physical and methadone refill. He states that patient was coherent and alert today, without signs of sedation. He performed repeat EKG which was WNL. He notes that her BP was slightly elevated today. He reports that he has decreased her Methadone to 105 mg and next week it will decrease by another 5 mg. He has concerns that the clonidine being increased to 0.1 mg TID and gabapentin 300 mg TID may increase sedation. I will have patient return for Nurse visit to check BP and other possible alternatives to BP management. We will continue to coordinate care with this patient for safety reasons.         I left message with NP, Elberta Fortis

## 2014-03-04 ENCOUNTER — Ambulatory Visit (HOSPITAL_BASED_OUTPATIENT_CLINIC_OR_DEPARTMENT_OTHER): Payer: No Typology Code available for payment source | Admitting: *Deleted

## 2014-03-04 ENCOUNTER — Ambulatory Visit: Payer: No Typology Code available for payment source | Attending: Internal Medicine | Admitting: Internal Medicine

## 2014-03-04 ENCOUNTER — Encounter: Payer: Self-pay | Admitting: Internal Medicine

## 2014-03-04 VITALS — BP 134/85 | HR 61 | Temp 98.5°F | Resp 16 | Ht 63.0 in | Wt 192.0 lb

## 2014-03-04 DIAGNOSIS — K0889 Other specified disorders of teeth and supporting structures: Secondary | ICD-10-CM

## 2014-03-04 DIAGNOSIS — E669 Obesity, unspecified: Secondary | ICD-10-CM | POA: Insufficient documentation

## 2014-03-04 DIAGNOSIS — M542 Cervicalgia: Secondary | ICD-10-CM | POA: Insufficient documentation

## 2014-03-04 DIAGNOSIS — G8929 Other chronic pain: Secondary | ICD-10-CM | POA: Insufficient documentation

## 2014-03-04 DIAGNOSIS — Z1239 Encounter for other screening for malignant neoplasm of breast: Secondary | ICD-10-CM

## 2014-03-04 DIAGNOSIS — F17200 Nicotine dependence, unspecified, uncomplicated: Secondary | ICD-10-CM | POA: Insufficient documentation

## 2014-03-04 DIAGNOSIS — M549 Dorsalgia, unspecified: Secondary | ICD-10-CM

## 2014-03-04 DIAGNOSIS — I10 Essential (primary) hypertension: Secondary | ICD-10-CM

## 2014-03-04 DIAGNOSIS — Z720 Tobacco use: Secondary | ICD-10-CM

## 2014-03-04 DIAGNOSIS — Z79899 Other long term (current) drug therapy: Secondary | ICD-10-CM | POA: Insufficient documentation

## 2014-03-04 DIAGNOSIS — Z23 Encounter for immunization: Secondary | ICD-10-CM

## 2014-03-04 DIAGNOSIS — K088 Other specified disorders of teeth and supporting structures: Secondary | ICD-10-CM | POA: Insufficient documentation

## 2014-03-04 LAB — COMPLETE METABOLIC PANEL WITH GFR
ALBUMIN: 4.2 g/dL (ref 3.5–5.2)
ALT: 15 U/L (ref 0–35)
AST: 15 U/L (ref 0–37)
Alkaline Phosphatase: 124 U/L — ABNORMAL HIGH (ref 39–117)
BILIRUBIN TOTAL: 0.3 mg/dL (ref 0.2–1.2)
BUN: 9 mg/dL (ref 6–23)
CHLORIDE: 101 meq/L (ref 96–112)
CO2: 26 mEq/L (ref 19–32)
CREATININE: 0.72 mg/dL (ref 0.50–1.10)
Calcium: 9.5 mg/dL (ref 8.4–10.5)
Glucose, Bld: 85 mg/dL (ref 70–99)
POTASSIUM: 4.4 meq/L (ref 3.5–5.3)
SODIUM: 138 meq/L (ref 135–145)
Total Protein: 7.2 g/dL (ref 6.0–8.3)

## 2014-03-04 LAB — CBC
HCT: 41.2 % (ref 36.0–46.0)
HEMOGLOBIN: 14.1 g/dL (ref 12.0–15.0)
MCH: 26.7 pg (ref 26.0–34.0)
MCHC: 34.2 g/dL (ref 30.0–36.0)
MCV: 77.9 fL — AB (ref 78.0–100.0)
MPV: 9 fL — AB (ref 9.4–12.4)
Platelets: 435 10*3/uL — ABNORMAL HIGH (ref 150–400)
RBC: 5.29 MIL/uL — AB (ref 3.87–5.11)
RDW: 14.9 % (ref 11.5–15.5)
WBC: 10.4 10*3/uL (ref 4.0–10.5)

## 2014-03-04 LAB — LIPID PANEL
Cholesterol: 159 mg/dL (ref 0–200)
HDL: 35 mg/dL — ABNORMAL LOW (ref >39)
LDL Cholesterol: 88 mg/dL (ref 0–99)
Total CHOL/HDL Ratio: 4.5 Ratio
Triglycerides: 179 mg/dL — ABNORMAL HIGH (ref <150)
VLDL: 36 mg/dL (ref 0–40)

## 2014-03-04 LAB — HEMOGLOBIN A1C
Hgb A1c MFr Bld: 6.2 % — ABNORMAL HIGH (ref <5.7)
Mean Plasma Glucose: 131 mg/dL — ABNORMAL HIGH (ref <117)

## 2014-03-04 MED ORDER — CLONIDINE HCL 0.1 MG PO TABS: ORAL_TABLET | ORAL | Status: DC

## 2014-03-04 MED ORDER — AMLODIPINE BESYLATE 10 MG PO TABS: 10.0000 mg | ORAL_TABLET | Freq: Every day | ORAL | Status: DC

## 2014-03-04 MED ORDER — GABAPENTIN 300 MG PO CAPS: 300.0000 mg | ORAL_CAPSULE | Freq: Three times a day (TID) | ORAL | Status: DC

## 2014-03-04 NOTE — Progress Notes (Signed)
Patient ID: Charlene Allen, female   DOB: [birth year], 58 y.o.   MRN: [id number]  CC:  Neck, back pain  HPI:  Patient presents to clinic today for a follow up of chronic neck and back pain.  She is currently in the [facility] Methadone clinic is down to Methadone 95 mg QD.  Patient also takes Gabapentin 300 mg TID for chronic pain.  She is requesting a MRI today and would like a referral to Sports Medicine for further treatment.  She states that she is beginning to have increased pain in her lower back and neck, aching and sharp pain.  The neck pain is referred to the back of her head causing headaches and nausea. She denies bowel and bladder dysfunction.  Lower back spasms.   Patient's blood pressure is significantly improved from last visit. She states that when she was [facility] and was found to have a elevated blood sugar and needs to be checked for diabetes.    No Known Allergies Past Medical History  Diagnosis Date  . Hypertension   . Obesity   . PMB (postmenopausal bleeding)    Current Outpatient Prescriptions on File Prior to Visit  Medication Sig Dispense Refill  . amLODipine (NORVASC) 10 MG tablet Take 1 tablet (10 mg total) by mouth daily. 90 tablet 3  . cloNIDine (CATAPRES) 0.1 MG tablet TAKE ONE TABLET BY MOUTH THREE TIMES DAILY 90 tablet 3  . gabapentin (NEURONTIN) 300 MG capsule Take 1 capsule (300 mg total) by mouth 3 (three) times daily. 90 capsule 3  . methadone (DOLOPHINE) 10 MG tablet Take 110 mg by mouth daily. Methadone clinic for opiate withdrawal    . nicotine (NICODERM CQ) 21 mg/24hr patch Place 1 patch (21 mg total) onto the skin daily. (Patient not taking: Reported on 03/04/2014) 28 patch 0   No current facility-administered medications on file prior to visit.   Family History  Problem Relation Age of Onset  . COPD Mother   . Stroke Mother   . Heart disease Mother   . Hypertension Mother   . Cancer Sister 25    cervical cancer   . Stroke Maternal Grandmother     History   Social History  . Marital Status: Married    Spouse Name: N/A    Number of Children: N/A  . Years of Education: N/A   Occupational History  . Not on file.   Social History Main Topics  . Smoking status: Current Every Day Smoker -- 1.00 packs/day for 40 years  . Smokeless tobacco: Not on file  . Alcohol Use: No  . Drug Use: Not on file  . Sexual Activity: Not Currently    Birth Control/ Protection: None   Other Topics Concern  . Not on file   Social History Narrative    Review of Systems: See HPI    Objective:   Filed Vitals:   03/04/14 1004  BP: 134/85  Pulse: 61  Temp: 98.5 F (36.9 C)  Resp: 16    Physical Exam  Constitutional: She is oriented to person, place, and time.  Neck: Normal range of motion.  Cardiovascular: Normal rate, regular rhythm and normal heart sounds.   Pulmonary/Chest: Effort normal and breath sounds normal.  Abdominal: Soft. Bowel sounds are normal.  Musculoskeletal: She exhibits no edema or tenderness.  Neurological: She is alert and oriented to person, place, and time.  Skin: Skin is warm and dry.     Lab Results  Component Value Date  WBC 10.1 03/15/2013   HGB 14.1 03/15/2013   HCT 40.8 03/15/2013   MCV 82.6 03/15/2013   PLT 337 03/15/2013   Lab Results  Component Value Date   CREATININE 0.71 03/15/2013   BUN 7 03/15/2013   NA 141 03/15/2013   K 4.5 03/15/2013   CL 104 03/15/2013   CO2 27 03/15/2013    No results found for: HGBA1C Lipid Panel     Component Value Date/Time   CHOL 151 03/15/2013 1003   TRIG 183* 03/15/2013 1003   HDL 32* 03/15/2013 1003   CHOLHDL 4.7 03/15/2013 1003   VLDL 37 03/15/2013 1003   LDLCALC 82 03/15/2013 1003       Assessment and plan:   Charlene Allen was seen today for follow-up.  Diagnoses and associated orders for this visit:  Essential hypertension - cloNIDine (CATAPRES) 0.1 MG tablet; TAKE ONE TABLET BY MOUTH THREE TIMES DAILY - amLODipine (NORVASC) 10 MG  tablet; Take 1 tablet (10 mg total) by mouth daily. - Hemoglobin A1c - Lipid panel - COMPLETE METABOLIC PANEL WITH GFR - CBC Patient blood pressure is stable and may continue on current medication.  Education on diet, exercise, and modifiable risk factors discussed. Will obtain appropriate labs as needed. Will follow up in 3-6 months.   Chronic neck pain - gabapentin (NEURONTIN) 300 MG capsule; Take 1 capsule (300 mg total) by mouth 3 (three) times daily. - Ambulatory referral to Sports Medicine  Chronic back pain - MR Lumbar Spine Wo Contrast; Future  Pain, dental - Ambulatory referral to Dentistry  Smoking Smoking cessation discussed for 3 minutes, patient is not willing to quit at this time. Will continue to assess on each visit. Discussed increased risk for diseases such as cancer, heart disease, and stroke.   Breast cancer screening - MM DIGITAL SCREENING BILATERAL; Future  Need for influenza vaccination Influenza injection received.  Explained side effects and contraindications to patient. Information sheet given to patient.   Return in about 3 months (around 06/03/2014) for Hypertension.        [name], [place] [facility] [phone] 03/04/2014, 10:26 AM

## 2014-03-04 NOTE — Patient Instructions (Signed)
Smoking Cessation Quitting smoking is important to your health and has many advantages. However, it is not always easy to quit since nicotine is a very addictive drug. Oftentimes, people try 3 times or more before being able to quit. This document explains the best ways for you to prepare to quit smoking. Quitting takes hard work and a lot of effort, but you can do it. ADVANTAGES OF QUITTING SMOKING  You will live longer, feel better, and live better.  Your body will feel the impact of quitting smoking almost immediately.  Within 20 minutes, blood pressure decreases. Your pulse returns to its normal level.  After 8 hours, carbon monoxide levels in the blood return to normal. Your oxygen level increases.  After 24 hours, the chance of having a heart attack starts to decrease. Your breath, hair, and body stop smelling like smoke.  After 48 hours, damaged nerve endings begin to recover. Your sense of taste and smell improve.  After 72 hours, the body is virtually free of nicotine. Your bronchial tubes relax and breathing becomes easier.  After 2 to 12 weeks, lungs can hold more air. Exercise becomes easier and circulation improves.  The risk of having a heart attack, stroke, cancer, or lung disease is greatly reduced.  After 1 year, the risk of coronary heart disease is cut in half.  After 5 years, the risk of stroke falls to the same as a nonsmoker.  After 10 years, the risk of lung cancer is cut in half and the risk of other cancers decreases significantly.  After 15 years, the risk of coronary heart disease drops, usually to the level of a nonsmoker.  If you are pregnant, quitting smoking will improve your chances of having a healthy baby.  The people you live with, especially any children, will be healthier.  You will have extra money to spend on things other than cigarettes. QUESTIONS TO THINK ABOUT BEFORE ATTEMPTING TO QUIT You may want to talk about your answers with your  health care provider.  Why do you want to quit?  If you tried to quit in the past, what helped and what did not?  What will be the most difficult situations for you after you quit? How will you plan to handle them?  Who can help you through the tough times? Your family? Friends? A health care provider?  What pleasures do you get from smoking? What ways can you still get pleasure if you quit? Here are some questions to ask your health care provider:  How can you help me to be successful at quitting?  What medicine do you think would be best for me and how should I take it?  What should I do if I need more help?  What is smoking withdrawal like? How can I get information on withdrawal? GET READY  Set a quit date.  Change your environment by getting rid of all cigarettes, ashtrays, matches, and lighters in your home, car, or work. Do not let people smoke in your home.  Review your past attempts to quit. Think about what worked and what did not. GET SUPPORT AND ENCOURAGEMENT You have a better chance of being successful if you have help. You can get support in many ways.  Tell your family, friends, and coworkers that you are going to quit and need their support. Ask them not to smoke around you.  Get individual, group, or telephone counseling and support. Programs are available at local hospitals and health centers. Call   your local health department for information about programs in your area.  Spiritual beliefs and practices may help some smokers quit.  Download a "quit meter" on your computer to keep track of quit statistics, such as how long you have gone without smoking, cigarettes not smoked, and money saved.  Get a self-help book about quitting smoking and staying off tobacco. LEARN NEW SKILLS AND BEHAVIORS  Distract yourself from urges to smoke. Talk to someone, go for a walk, or occupy your time with a task.  Change your normal routine. Take a different route to work.  Drink tea instead of coffee. Eat breakfast in a different place.  Reduce your stress. Take a hot bath, exercise, or read a book.  Plan something enjoyable to do every day. Reward yourself for not smoking.  Explore interactive web-based programs that specialize in helping you quit. GET MEDICINE AND USE IT CORRECTLY Medicines can help you stop smoking and decrease the urge to smoke. Combining medicine with the above behavioral methods and support can greatly increase your chances of successfully quitting smoking.  Nicotine replacement therapy helps deliver nicotine to your body without the negative effects and risks of smoking. Nicotine replacement therapy includes nicotine gum, lozenges, inhalers, nasal sprays, and skin patches. Some may be available over-the-counter and others require a prescription.  Antidepressant medicine helps people abstain from smoking, but how this works is unknown. This medicine is available by prescription.  Nicotinic receptor partial agonist medicine simulates the effect of nicotine in your brain. This medicine is available by prescription. Ask your health care provider for advice about which medicines to use and how to use them based on your health history. Your health care provider will tell you what side effects to look out for if you choose to be on a medicine or therapy. Carefully read the information on the package. Do not use any other product containing nicotine while using a nicotine replacement product.  RELAPSE OR DIFFICULT SITUATIONS Most relapses occur within the first 3 months after quitting. Do not be discouraged if you start smoking again. Remember, most people try several times before finally quitting. You may have symptoms of withdrawal because your body is used to nicotine. You may crave cigarettes, be irritable, feel very hungry, cough often, get headaches, or have difficulty concentrating. The withdrawal symptoms are only temporary. They are strongest  when you first quit, but they will go away within 10-14 days. To reduce the chances of relapse, try to:  Avoid drinking alcohol. Drinking lowers your chances of successfully quitting.  Reduce the amount of caffeine you consume. Once you quit smoking, the amount of caffeine in your body increases and can give you symptoms, such as a rapid heartbeat, sweating, and anxiety.  Avoid smokers because they can make you want to smoke.  Do not let weight gain distract you. Many smokers will gain weight when they quit, usually less than 10 pounds. Eat a healthy diet and stay active. You can always lose the weight gained after you quit.  Find ways to improve your mood other than smoking. FOR MORE INFORMATION  www.smokefree.gov  Document Released: 03/09/2001 Document Revised: 07/30/2013 Document Reviewed: 06/24/2011 Exit[employer] Patient Information 2015 [employer]. This information is not intended to replace advice given to you by your health care provider. Make sure you discuss any questions you have with your health care provider.  

## 2014-03-04 NOTE — Progress Notes (Signed)
Pt is here today following up on her HTN and chronic pain in her neck and her lower back. Pt is requesting a referral to sports medicine and she wants an MRI.

## 2014-03-15 ENCOUNTER — Ambulatory Visit: Payer: Self-pay | Admitting: Obstetrics & Gynecology

## 2014-03-27 ENCOUNTER — Ambulatory Visit (HOSPITAL_COMMUNITY)
Admission: RE | Admit: 2014-03-27 | Discharge: 2014-03-27 | Disposition: A | Discharge status: Home / Self Care | Payer: No Typology Code available for payment source | Source: Ambulatory Visit | Attending: Internal Medicine | Admitting: Internal Medicine

## 2014-03-27 DIAGNOSIS — M129 Arthropathy, unspecified: Secondary | ICD-10-CM | POA: Insufficient documentation

## 2014-03-27 DIAGNOSIS — M549 Dorsalgia, unspecified: Secondary | ICD-10-CM | POA: Insufficient documentation

## 2014-03-27 DIAGNOSIS — G8929 Other chronic pain: Secondary | ICD-10-CM

## 2014-04-08 ENCOUNTER — Ambulatory Visit: Payer: No Typology Code available for payment source | Admitting: Internal Medicine

## 2014-04-12 ENCOUNTER — Telehealth: Payer: Self-pay | Admitting: *Deleted

## 2014-04-12 NOTE — Telephone Encounter (Signed)
Reviewed labs with patient Discussed need for low carbohydrate diet Patient states that she has had several recent episodes of non-radiating chest pain but did not want to go to ED Patient denies chest pain at present Patient instructed to go directly to ED or call 911 if chest pain occurs in future. Patient states she will.

## 2014-04-12 NOTE — Telephone Encounter (Signed)
-----   Message from Lance Bosch, NP sent at 03/19/2014  2:02 PM EST ----- Borderline diabetic, please educated regarding diet and lifestyle changes.  Thanks

## 2014-04-17 ENCOUNTER — Ambulatory Visit: Payer: No Typology Code available for payment source | Admitting: Internal Medicine

## 2014-05-08 ENCOUNTER — Encounter: Payer: Self-pay | Admitting: Internal Medicine

## 2014-05-08 ENCOUNTER — Ambulatory Visit: Payer: No Typology Code available for payment source | Attending: Internal Medicine | Admitting: Internal Medicine

## 2014-05-08 VITALS — BP 144/81 | HR 54 | Temp 98.4°F | Resp 14 | Ht 64.0 in | Wt 192.0 lb

## 2014-05-08 DIAGNOSIS — G8929 Other chronic pain: Secondary | ICD-10-CM

## 2014-05-08 DIAGNOSIS — M5020 Other cervical disc displacement, unspecified cervical region: Secondary | ICD-10-CM

## 2014-05-08 DIAGNOSIS — F17210 Nicotine dependence, cigarettes, uncomplicated: Secondary | ICD-10-CM | POA: Insufficient documentation

## 2014-05-08 DIAGNOSIS — M542 Cervicalgia: Secondary | ICD-10-CM

## 2014-05-08 DIAGNOSIS — F1123 Opioid dependence with withdrawal: Secondary | ICD-10-CM | POA: Insufficient documentation

## 2014-05-08 DIAGNOSIS — I10 Essential (primary) hypertension: Secondary | ICD-10-CM | POA: Insufficient documentation

## 2014-05-08 MED ORDER — KETOROLAC TROMETHAMINE 60 MG/2ML IM SOLN
60.0000 mg | Freq: Once | INTRAMUSCULAR | Status: AC
  Administered 2014-05-08: 60 mg via INTRAMUSCULAR

## 2014-05-08 MED ORDER — GABAPENTIN 300 MG PO CAPS: 300.0000 mg | ORAL_CAPSULE | Freq: Three times a day (TID) | ORAL | Status: DC

## 2014-05-08 NOTE — Patient Instructions (Signed)
Herniated Disk A herniated disk occurs when a disk in your spine bulges out too far. This condition is also called a ruptured disk or slipped disk. Your spine (backbone) is made up of bones called vertebrae. Between each pair of vertebrae is an oval disk with a soft, spongy center that acts as a shock absorber when you move. The spongy center is surrounded by a tough outer ring. When you have a herniated disk, the spongy center of the disk bulges out or ruptures through the outer ring. A herniated disk can press on a nerve between your vertebrae and cause pain. A herniated disk can occur anywhere in your back or neck area, but the lower back is the most common spot. CAUSES  In many cases, a herniated disk occurs just from getting older. As you age, the spongy insides of your disks tend to shrink and dry out. A herniated disk can result from gradual wear and tear. Injury or sudden strain can also cause a herniated disk.  RISK FACTORS Aging is the main risk factor for a herniated disk. Other risk factors include:  Being a man between the ages of 39 and 18 years.  Having a job that requires heavy lifting, bending, or twisting.  Having a job that requires long hours of driving.  Not getting enough exercise.  Being overweight.  Smoking. SIGNS AND SYMPTOMS  Signs and symptoms depend on which disk is herniated.  For a herniated disk in the lower back, you may have sharp pain in:  One part of your leg, hip, or buttocks.  The back of your calf.  The top or sole of your foot (sciatica).   For a herniated disk in the neck, you may feel pain:  When you move your neck.  Near or over your shoulder blade.  That moves to your upper arm, forearm, or fingers.   You may also have muscle weakness. It may be hard to:  Lift your leg or arm.  Stand on your toes.  Squeeze tightly with one of your hands.  Other symptoms can include:  Numbness or tingling in the affected areas of your  body.  Loss of bladder or bowel control. This is a rare but serious sign of a severe herniated disk in the lower back. DIAGNOSIS  Your health care provider will do a physical exam. During this exam, you may have to move certain body parts or assume various positions. For example, your health care provider may do the straight-leg test. This is a good way to test for a herniated disk in your lower back. In this test, the health care provider lifts your leg while you lie on your back. This is to see if you feel pain down your leg. Your health care provider will also check for numbness or loss of feeling.  Your health care provider will also check your:  Reflexes.  Muscle strength.  Posture.  Other tests may be done to help in making a diagnosis. These may include:  An X-ray of the spine to rule out other causes of back pain.   Other imaging studies, such as an MRI or CT scan. This is to check whether the herniated disk is pressing on your spinal canal.  Electromyography (EMG). This test checks the nerves that control muscles. It is sometimes used to identify the specific area of nerve involvement.  TREATMENT  In many cases, herniated disk symptoms go away over a period of days or weeks. You will most  likely be free of symptoms in 3-4 months. Treatment may include the following:  The initial treatment for a herniated disk is ashort period of rest.  Bed rest is often limited to 1 or 2 days. Resting for too long delays recovery.  If you have a herniated disk in your lower back, you should avoid sitting as much as possible because sitting increases pressure on the disk.  Medicines. These may include:   Nonsteroidal anti-inflammatory drugs (NSAIDs).  Muscle relaxants for back spasms.  Narcotic pain medicine if your pain is very bad.   Steroid injections. You may need these along the involved nerve root to help control pain. The steroid is injected in the area of the herniated disk.  It helps by reducing swelling around the disk.  Physical therapy. This may include exercises to strengthen the muscles that help support your spine.   You may need surgery if other treatments do not work.  HOME CARE INSTRUCTIONS Follow all your health care provider's instructions. These may include:  Take all medicines as directed by your health care provider.  Rest for 2 days and then start moving.  Do not sit or stand for long periods of time.  Maintain good posture when sitting and standing.  Avoid movements that cause pain, such as bending or lifting.  When you are able to start lifting things again:  [place] with your knees.  Keep your back straight.  Hold heavy objects close to your body.  If you are overweight, ask your health care provider to help you start a weight-loss program.  When you are able to start exercising, ask your health care provider how much and what type of exercise is best for you.  Work with a physical therapist on stretching and strengthening exercises for your back.  Do not wear high-heeled shoes.  Do not sleep on your belly.  Do not smoke.  Keep all follow-up visits as directed by your health care provider. SEEK MEDICAL CARE IF:  You have back or neck pain that is not getting better after 4 weeks.  You have very bad pain in your back or neck.  You develop numbness, tingling, or weakness along with pain. SEEK IMMEDIATE MEDICAL CARE IF:   You have numbness, tingling, or weakness that makes you unable to use your arms or legs.  You lose control of your bladder or bowels.  You have dizziness or fainting.  You have shortness of breath.  MAKE SURE YOU:   Understand these instructions.  Will watch your condition.  Will get help right away if you are not doing well or get worse. Document Released: 03/12/2000 Document Revised: 07/30/2013 Document Reviewed: 02/16/2013 [facility] Patient Information 2015 [place], [place]. This information  is not intended to replace advice given to you by your health care provider. Make sure you discuss any questions you have with your health care provider.

## 2014-05-08 NOTE — Progress Notes (Signed)
Pt is here following up on her HTN and her chronic pain in her neck and lower back. Pt states that she has had a severe headache for 3 days.

## 2014-05-08 NOTE — Progress Notes (Signed)
Patient ID: Charlene Allen, female   DOB: November 26, 1955, 59 y.o.   MRN: [id number]  CC: neck pain HPI: Charlene Allen is a 59 y.o. female here today for a follow up visit.  Patient has past medical history of HTN and chronic neck and back pain.  The patient was found to have a herniated cervical disc back in 2009 and has had chronic pain since then. She is currently being evaluated by the Methadone clinic and is taking Methadone 80 mg. She states that the neck pain has been causing her to have severe headaches for the past 3 days. The patient has been suffering from severe headaches for several years as a result of neck pain.  She states that she was told that she cannot take more than 300 mg of gabapentin due to being in the Methadone program.  She would like assistance in finding help for her pain.   Patient has No headache, No chest pain, No abdominal pain - No Nausea, No new weakness tingling or numbness, No Cough - SOB.  No Known Allergies Past Medical History  Diagnosis Date  . Hypertension   . Obesity   . PMB (postmenopausal bleeding)    Current Outpatient Prescriptions on File Prior to Visit  Medication Sig Dispense Refill  . amLODipine (NORVASC) 10 MG tablet Take 1 tablet (10 mg total) by mouth daily. 90 tablet 3  . gabapentin (NEURONTIN) 300 MG capsule Take 1 capsule (300 mg total) by mouth 3 (three) times daily. 90 capsule 3  . cloNIDine (CATAPRES) 0.1 MG tablet TAKE ONE TABLET BY MOUTH THREE TIMES DAILY (Patient not taking: Reported on 05/08/2014) 90 tablet 3  . methadone (DOLOPHINE) 10 MG tablet Take 95 mg by mouth daily. Methadone clinic for opiate withdrawal    . nicotine (NICODERM CQ) 21 mg/24hr patch Place 1 patch (21 mg total) onto the skin daily. (Patient not taking: Reported on 03/04/2014) 28 patch 0   No current facility-administered medications on file prior to visit.   Family History  Problem Relation Age of Onset  . COPD Mother   . Stroke Mother   . Heart disease  Mother   . Hypertension Mother   . Cancer Sister 25    cervical cancer   . Stroke Maternal Grandmother    History   Social History  . Marital Status: Married    Spouse Name: N/A  . Number of Children: N/A  . Years of Education: N/A   Occupational History  . Not on file.   Social History Main Topics  . Smoking status: Current Every Day Smoker -- 1.00 packs/day for 40 years  . Smokeless tobacco: Not on file  . Alcohol Use: No  . Drug Use: Not on file  . Sexual Activity: Not Currently    Birth Control/ Protection: None   Other Topics Concern  . Not on file   Social History Narrative    Review of Systems: See HPI   Objective:   Filed Vitals:   05/08/14 0916  BP: 144/81  Pulse: 54  Temp: 98.4 F (36.9 C)  Resp: 14    Physical Exam  Constitutional: She is oriented to person, place, and time.  Cardiovascular: Normal rate, regular rhythm and normal heart sounds.   Pulmonary/Chest: Effort normal and breath sounds normal.  Abdominal: Soft. Bowel sounds are normal.  Musculoskeletal: She exhibits tenderness (cervical spine).  Neurological: She is alert and oriented to person, place, and time. She displays normal reflexes. Coordination normal.  Skin: Skin  is warm and dry.     Lab Results  Component Value Date   WBC 10.4 03/04/2014   HGB 14.1 03/04/2014   HCT 41.2 03/04/2014   MCV 77.9* 03/04/2014   PLT 435* 03/04/2014   Lab Results  Component Value Date   CREATININE 0.72 03/04/2014   BUN 9 03/04/2014   NA 138 03/04/2014   K 4.4 03/04/2014   CL 101 03/04/2014   CO2 26 03/04/2014    Lab Results  Component Value Date   HGBA1C 6.2* 03/04/2014   Lipid Panel     Component Value Date/Time   CHOL 159 03/04/2014 1101   TRIG 179* 03/04/2014 1101   HDL 35* 03/04/2014 1101   CHOLHDL 4.5 03/04/2014 1101   VLDL 36 03/04/2014 1101   LDLCALC 88 03/04/2014 1101       Assessment and plan:   Charlene was seen today for follow-up.  Diagnoses and all orders  for this visit:  Cervical disc herniation Orders: -     Ambulatory referral to Orthopedic Surgery Explained signs and symptoms that should warrant immediate attention.  Patient verbalized understanding with teach back used.  Chronic neck pain Orders: -    Refill  gabapentin (NEURONTIN) 300 MG capsule; Take 1 capsule (300 mg total) by mouth 3 (three) times daily. -     ketorolac (TORADOL) injection 60 mg; Inject 2 mLs (60 mg total) into the muscle once.  Return if symptoms worsen or fail to improve.        [name], NP-C [facility] [phone] 05/08/2014, 9:32 AM

## 2014-07-11 ENCOUNTER — Ambulatory Visit: Payer: No Typology Code available for payment source | Admitting: Internal Medicine

## 2014-07-27 ENCOUNTER — Other Ambulatory Visit: Payer: Self-pay | Admitting: Internal Medicine

## 2014-07-29 NOTE — Telephone Encounter (Signed)
Patient due for 3 month f/u with PCP around 08/06/14.

## 2014-07-30 ENCOUNTER — Other Ambulatory Visit: Payer: Self-pay | Admitting: Internal Medicine

## 2014-07-31 ENCOUNTER — Telehealth: Payer: Self-pay | Admitting: Internal Medicine

## 2014-07-31 NOTE — Telephone Encounter (Signed)
Patient has come in today to inquire about an orthopedic referral she requested back in February; please f/u with patient about this request

## 2014-08-12 ENCOUNTER — Ambulatory Visit: Payer: No Typology Code available for payment source | Admitting: Internal Medicine

## 2014-09-02 ENCOUNTER — Encounter: Payer: Self-pay | Admitting: Internal Medicine

## 2014-09-02 ENCOUNTER — Ambulatory Visit: Payer: Self-pay | Attending: Internal Medicine | Admitting: Internal Medicine

## 2014-09-02 ENCOUNTER — Other Ambulatory Visit: Payer: Self-pay | Admitting: Internal Medicine

## 2014-09-02 VITALS — BP 136/74 | HR 64 | Temp 98.6°F | Ht 63.0 in | Wt 195.6 lb

## 2014-09-02 DIAGNOSIS — I10 Essential (primary) hypertension: Secondary | ICD-10-CM | POA: Insufficient documentation

## 2014-09-02 DIAGNOSIS — Z23 Encounter for immunization: Secondary | ICD-10-CM | POA: Insufficient documentation

## 2014-09-02 DIAGNOSIS — N6002 Solitary cyst of left breast: Secondary | ICD-10-CM | POA: Insufficient documentation

## 2014-09-02 DIAGNOSIS — F1123 Opioid dependence with withdrawal: Secondary | ICD-10-CM | POA: Insufficient documentation

## 2014-09-02 DIAGNOSIS — Z720 Tobacco use: Secondary | ICD-10-CM

## 2014-09-02 DIAGNOSIS — L918 Other hypertrophic disorders of the skin: Secondary | ICD-10-CM | POA: Insufficient documentation

## 2014-09-02 DIAGNOSIS — G8929 Other chronic pain: Secondary | ICD-10-CM | POA: Insufficient documentation

## 2014-09-02 DIAGNOSIS — Z87891 Personal history of nicotine dependence: Secondary | ICD-10-CM | POA: Insufficient documentation

## 2014-09-02 DIAGNOSIS — F17200 Nicotine dependence, unspecified, uncomplicated: Secondary | ICD-10-CM | POA: Insufficient documentation

## 2014-09-02 DIAGNOSIS — M542 Cervicalgia: Secondary | ICD-10-CM | POA: Insufficient documentation

## 2014-09-02 DIAGNOSIS — M549 Dorsalgia, unspecified: Secondary | ICD-10-CM | POA: Insufficient documentation

## 2014-09-02 MED ORDER — AMLODIPINE BESYLATE 10 MG PO TABS: 10.0000 mg | ORAL_TABLET | Freq: Every day | ORAL | Status: DC

## 2014-09-02 MED ORDER — CLONIDINE HCL 0.1 MG PO TABS
ORAL_TABLET | ORAL | Status: DC
Start: 2014-09-02 — End: 2015-03-03

## 2014-09-02 MED ORDER — GABAPENTIN 300 MG PO CAPS: 300.0000 mg | ORAL_CAPSULE | Freq: Three times a day (TID) | ORAL | Status: DC

## 2014-09-02 NOTE — Progress Notes (Signed)
Patient states her back pain is progressively getting worse and rates her pain 7/10. She describes the pain as lower back spasms She states she is "getting off the methadone" and the gabapentin is not helping She is asking about her referral to ortho-according to note Alinda Sierras says the patient had no insurance and she sent a letter telling patient she should apply for orange card Patient states she now has the orange card Patient also asking for referral to dermatology for skin tags Patient had a pap smear last year that was normal Patient has never had a mammogram Patient smoked 1 ppd

## 2014-09-02 NOTE — Patient Instructions (Signed)
Call back when you get [facility] discount so that I may place referral for Orthopedics

## 2014-09-02 NOTE — Progress Notes (Signed)
Patient ID: Charlene Allen, female   DOB: 1955-08-19, 59 y.o.   MRN: [id number]  CC: back pain, HTN  HPI: [name] is a 59 y.o. female here today for a follow up visit.  Patient has past medical history of substance abuse, chronic back pain, and HTN.  Patient presents for a follow up of her back pain. She believes the back pain is getting progressively worse and now rates the pain as 7/10. She has been weaning off methadone (now on 55 mg per day) for the past several months and feels like the gabapentin is no longer helping as much. Back pain described as spasm pain. She denies bowel or bladder dysfunction. She continues to smoke 1 ppd and reports that she is unable to afford nicotine patches.  Patient c/o of multiple skin tags that have been present for several years. She has noticed more since last year when she was exposed to more sun after a long beach trip. Patient is requesting a dermatology referral for removal.   Patient has No headache, no edema, no palpitations, No chest pain, No abdominal pain - No Nausea, No Cough - SOB.  No Known Allergies Past Medical History  Diagnosis Date  . Hypertension   . Obesity   . PMB (postmenopausal bleeding)    Current Outpatient Prescriptions on File Prior to Visit  Medication Sig Dispense Refill  . amLODipine (NORVASC) 10 MG tablet TAKE ONE TABLET BY MOUTH ONCE DAILY 90 tablet 0  . cloNIDine (CATAPRES) 0.1 MG tablet TAKE ONE TABLET BY MOUTH THREE TIMES DAILY 90 tablet 3  . gabapentin (NEURONTIN) 300 MG capsule Take 1 capsule (300 mg total) by mouth 3 (three) times daily. 90 capsule 3  . methadone (DOLOPHINE) 10 MG tablet Take 80 mg by mouth daily. Methadone clinic for opiate withdrawal     No current facility-administered medications on file prior to visit.   Family History  Problem Relation Age of Onset  . COPD Mother   . Stroke Mother   . Heart disease Mother   . Hypertension Mother   . Cancer Sister 25    cervical cancer   .  Stroke Maternal Grandmother    History   Social History  . Marital Status: Married    Spouse Name: N/A  . Number of Children: N/A  . Years of Education: N/A   Occupational History  . Not on file.   Social History Main Topics  . Smoking status: Current Every Day Smoker -- 1.00 packs/day for 40 years  . Smokeless tobacco: Not on file  . Alcohol Use: No  . Drug Use: Not on file  . Sexual Activity: Not Currently    Birth Control/ Protection: None   Other Topics Concern  . Not on file   Social History Narrative    Review of Systems: See HPI    Objective:   Filed Vitals:   09/02/14 0921  BP: 136/74  Pulse: 64  Temp: 98.6 F (37 C)    Physical Exam  Constitutional: She is oriented to person, place, and time.  Cardiovascular: Normal rate, regular rhythm and normal heart sounds.   Pulmonary/Chest: Effort normal and breath sounds normal.    Musculoskeletal: She exhibits tenderness (lumbar spine). She exhibits no edema.  Neurological: She is alert and oriented to person, place, and time.  Skin: Skin is warm and dry.     Lab Results  Component Value Date   WBC 10.4 03/04/2014   HGB 14.1 03/04/2014   HCT  41.2 03/04/2014   MCV 77.9* 03/04/2014   PLT 435* 03/04/2014   Lab Results  Component Value Date   CREATININE 0.72 03/04/2014   BUN 9 03/04/2014   NA 138 03/04/2014   K 4.4 03/04/2014   CL 101 03/04/2014   CO2 26 03/04/2014    Lab Results  Component Value Date   HGBA1C 6.2* 03/04/2014   Lipid Panel     Component Value Date/Time   CHOL 159 03/04/2014 1101   TRIG 179* 03/04/2014 1101   HDL 35* 03/04/2014 1101   CHOLHDL 4.5 03/04/2014 1101   VLDL 36 03/04/2014 1101   LDLCALC 88 03/04/2014 1101       Assessment and plan:   Deni was seen today for back pain.  Diagnoses and all orders for this visit:  Chronic back pain Patient may increase gabapentin to 600 mg at bedtime since she reports increased pain at night. I do not feel comfortable  writing any narcotics for this patient given her history and current methadone use.   Chronic neck pain Orders: -     gabapentin (NEURONTIN) 300 MG capsule; Take 1 capsule (300 mg total) by mouth 3 (three) times daily. May take 600 mg at bedtime Will place referral to ortho once she obtains hospital discount   Essential hypertension Orders: -     amLODipine (NORVASC) 10 MG tablet; Take 1 tablet (10 mg total) by mouth daily. -     cloNIDine (CATAPRES) 0.1 MG tablet; TAKE ONE TABLET BY MOUTH THREE TIMES DAILY Patient blood pressure is stable and may continue on current medication.  Education on diet, exercise, and modifiable risk factors discussed. Will obtain appropriate labs as needed. Will follow up in 3-6 months.   Tobacco use Smoking cessation discussed for 3 minutes, patient is not willing to quit at this time. Will continue to assess on each visit. Discussed increased risk for diseases such as cancer, heart disease, and stroke.   Breast cyst, left Orders: -     MM Digital Diagnostic Bilat; Future  Cutaneous skin tags Orders: -     Ambulatory referral to Dermatology R/o skin cancer   Need for Tdap vaccination Orders: -     Tdap vaccine greater than or equal to 7yo IM   Return in about 3 months (around 12/03/2014) for Hypertension.       Charlene Manning, NP-C [facility] [phone] 09/02/2014, 9:40 AM ]

## 2014-09-04 ENCOUNTER — Other Ambulatory Visit: Payer: Self-pay

## 2014-10-14 ENCOUNTER — Other Ambulatory Visit: Payer: Self-pay | Admitting: Internal Medicine

## 2014-10-23 ENCOUNTER — Other Ambulatory Visit: Payer: Self-pay | Admitting: Internal Medicine

## 2014-12-04 ENCOUNTER — Other Ambulatory Visit: Payer: Self-pay | Admitting: Internal Medicine

## 2014-12-06 ENCOUNTER — Telehealth: Payer: Self-pay | Admitting: Internal Medicine

## 2014-12-06 NOTE — Telephone Encounter (Signed)
Pt. Is asking if her dosage can be increased for Neurontin, she is experiencing back pain....please advised

## 2014-12-10 ENCOUNTER — Telehealth: Payer: Self-pay

## 2014-12-10 NOTE — Telephone Encounter (Signed)
What does of Methadone is she on now first? Please let me know and then I will decide and update chart as well

## 2014-12-10 NOTE — Telephone Encounter (Signed)
Patient called back. Please f/u °

## 2014-12-10 NOTE — Telephone Encounter (Signed)
Returned patient phone call Patient requesting an increase on her gabapentin Is this something you are willing to do?

## 2014-12-13 ENCOUNTER — Ambulatory Visit: Payer: Self-pay | Admitting: Internal Medicine

## 2015-02-26 ENCOUNTER — Telehealth: Payer: Self-pay | Admitting: Internal Medicine

## 2015-02-26 NOTE — Telephone Encounter (Signed)
Patient called and requested a med refill for all of her current medications, patient stated that she is out of medication. Please f/u

## 2015-02-28 NOTE — Telephone Encounter (Signed)
Patient called and requested a med refill for all of her current medications, patient stated that she is out of medication. Please f/u

## 2015-03-03 ENCOUNTER — Telehealth: Payer: Self-pay

## 2015-03-03 DIAGNOSIS — M542 Cervicalgia: Secondary | ICD-10-CM

## 2015-03-03 DIAGNOSIS — I10 Essential (primary) hypertension: Secondary | ICD-10-CM

## 2015-03-03 DIAGNOSIS — G8929 Other chronic pain: Secondary | ICD-10-CM

## 2015-03-03 MED ORDER — AMLODIPINE BESYLATE 10 MG PO TABS: 10.0000 mg | ORAL_TABLET | Freq: Every day | ORAL | Status: DC

## 2015-03-03 MED ORDER — CLONIDINE HCL 0.1 MG PO TABS: ORAL_TABLET | ORAL | Status: DC

## 2015-03-03 MED ORDER — GABAPENTIN 300 MG PO CAPS: 300.0000 mg | ORAL_CAPSULE | Freq: Three times a day (TID) | ORAL | Status: DC

## 2015-03-03 NOTE — Telephone Encounter (Signed)
Returned patient phone call Patient is requesting refills on her blood pressure medications and gabapentin] RX sent to pharmacy on file

## 2015-05-14 ENCOUNTER — Ambulatory Visit: Payer: Self-pay | Attending: Internal Medicine | Admitting: Internal Medicine

## 2015-05-14 ENCOUNTER — Telehealth: Payer: Self-pay

## 2015-05-14 ENCOUNTER — Encounter: Payer: Self-pay | Admitting: Internal Medicine

## 2015-05-14 ENCOUNTER — Telehealth: Payer: Self-pay | Admitting: Internal Medicine

## 2015-05-14 VITALS — BP 132/78 | HR 74 | Temp 98.0°F | Resp 16 | Ht 64.0 in | Wt 183.0 lb

## 2015-05-14 DIAGNOSIS — Z79899 Other long term (current) drug therapy: Secondary | ICD-10-CM | POA: Insufficient documentation

## 2015-05-14 DIAGNOSIS — F17210 Nicotine dependence, cigarettes, uncomplicated: Secondary | ICD-10-CM | POA: Insufficient documentation

## 2015-05-14 DIAGNOSIS — G629 Polyneuropathy, unspecified: Secondary | ICD-10-CM | POA: Insufficient documentation

## 2015-05-14 DIAGNOSIS — M542 Cervicalgia: Secondary | ICD-10-CM | POA: Insufficient documentation

## 2015-05-14 DIAGNOSIS — G8929 Other chronic pain: Secondary | ICD-10-CM | POA: Insufficient documentation

## 2015-05-14 DIAGNOSIS — R059 Cough, unspecified: Secondary | ICD-10-CM

## 2015-05-14 DIAGNOSIS — R05 Cough: Secondary | ICD-10-CM | POA: Insufficient documentation

## 2015-05-14 DIAGNOSIS — I10 Essential (primary) hypertension: Secondary | ICD-10-CM | POA: Insufficient documentation

## 2015-05-14 DIAGNOSIS — F17200 Nicotine dependence, unspecified, uncomplicated: Secondary | ICD-10-CM

## 2015-05-14 DIAGNOSIS — E669 Obesity, unspecified: Secondary | ICD-10-CM | POA: Insufficient documentation

## 2015-05-14 LAB — POCT GLYCOSYLATED HEMOGLOBIN (HGB A1C): HEMOGLOBIN A1C: 6

## 2015-05-14 MED ORDER — ALBUTEROL SULFATE HFA 108 (90 BASE) MCG/ACT IN AERS: 2.0000 | INHALATION_SPRAY | Freq: Four times a day (QID) | RESPIRATORY_TRACT | Status: DC | PRN

## 2015-05-14 MED ORDER — CLONIDINE HCL 0.1 MG PO TABS: ORAL_TABLET | ORAL | Status: DC

## 2015-05-14 MED ORDER — GABAPENTIN 300 MG PO CAPS: 600.0000 mg | ORAL_CAPSULE | Freq: Three times a day (TID) | ORAL | Status: DC

## 2015-05-14 MED ORDER — GABAPENTIN 300 MG PO CAPS: 300.0000 mg | ORAL_CAPSULE | Freq: Three times a day (TID) | ORAL | Status: DC

## 2015-05-14 MED ORDER — AMLODIPINE BESYLATE 10 MG PO TABS: 10.0000 mg | ORAL_TABLET | Freq: Every day | ORAL | Status: DC

## 2015-05-14 NOTE — Telephone Encounter (Signed)
Pt. Called stating that her gabapentin was going to be raised from 300mg  to 600mg . Please f/u with pt.

## 2015-05-14 NOTE — Telephone Encounter (Signed)
Returned patient phone call  Patient is aware a new RX for her gabapentin Has been sent to the pharmacy

## 2015-05-14 NOTE — Progress Notes (Signed)
Patient ID: Charlene Allen, female   DOB: 11-07-1955, 60 y.o.   MRN: [id number]  CC: follow up  HPI: Charlene Allen is a 60 y.o. female here today for a follow up visit.  Patient has past medical history of HTN, obesity, and past drug abuse. Patient reports that she is continuing to have severe neck and back pain that has been present for several years. She notes that the pain is gradually worsening since she has been weaned off Methadone. Pain is achy and begins in her neck and thoracic spine. She has not been managed by a Orthopedic doctor due to lack of insurance.  She has tingling in her bilateral lower extremities. Patient is concerned about withdrawals from Methadone since she stopped. Symptoms include nausea, diarrhea, leg pain, fatigue. Symptoms have improved significantly but she still feels fatigued. She now feels depressed. She reports that she cannot sleep even with the use of Melatonin. Would like refills of her blood pressure medication. She smokes 1 ppd with no plans to quit now. She reports coughing daily in the mornings upon awakening.   No Known Allergies Past Medical History  Diagnosis Date  . Hypertension   . Obesity   . PMB (postmenopausal bleeding)    Current Outpatient Prescriptions on File Prior to Visit  Medication Sig Dispense Refill  . amLODipine (NORVASC) 10 MG tablet Take 1 tablet (10 mg total) by mouth daily. 90 tablet 0  . cloNIDine (CATAPRES) 0.1 MG tablet TAKE ONE TABLET BY MOUTH THREE TIMES DAILY 90 tablet 0  . gabapentin (NEURONTIN) 300 MG capsule Take 1 capsule (300 mg total) by mouth 3 (three) times daily. May take 600 mg at bedtime 90 capsule 0  . methadone (DOLOPHINE) 10 MG tablet Take 80 mg by mouth daily. Reported on 05/14/2015     No current facility-administered medications on file prior to visit.   Family History  Problem Relation Age of Onset  . COPD Mother   . Stroke Mother   . Heart disease Mother   . Hypertension Mother   . Cancer Sister  25    cervical cancer   . Stroke Maternal Grandmother    Social History   Social History  . Marital Status: Married    Spouse Name: N/A  . Number of Children: N/A  . Years of Education: N/A   Occupational History  . Not on file.   Social History Main Topics  . Smoking status: Current Every Day Smoker -- 1.00 packs/day for 40 years  . Smokeless tobacco: Not on file  . Alcohol Use: No  . Drug Use: Not on file  . Sexual Activity: Not Currently    Birth Control/ Protection: None   Other Topics Concern  . Not on file   Social History Narrative    Review of Systems  Constitutional: Positive for malaise/fatigue.  Respiratory: Positive for cough and shortness of breath.   Musculoskeletal: Positive for back pain and neck pain. Negative for falls.  Psychiatric/Behavioral: Positive for depression. The patient has insomnia.   All other systems reviewed and are negative.  Objective:   Filed Vitals:   05/14/15 1044  BP: 132/78  Pulse: 74  Temp: 98 F (36.7 C)  Resp: 16    Physical Exam  Constitutional: She is oriented to person, place, and time.  Cardiovascular: Normal rate, regular rhythm and normal heart sounds.   Pulmonary/Chest: Effort normal and breath sounds normal.  Musculoskeletal: She exhibits edema.  Neurological: She is alert and oriented to person,  place, and time.  Skin: Skin is warm and dry.  Psychiatric: She has a normal mood and affect.  depressed   .  Lab Results  Component Value Date   WBC 10.4 03/04/2014   HGB 14.1 03/04/2014   HCT 41.2 03/04/2014   MCV 77.9* 03/04/2014   PLT 435* 03/04/2014   Lab Results  Component Value Date   CREATININE 0.72 03/04/2014   BUN 9 03/04/2014   NA 138 03/04/2014   K 4.4 03/04/2014   CL 101 03/04/2014   CO2 26 03/04/2014    Lab Results  Component Value Date   HGBA1C 6.2* 03/04/2014   Lipid Panel     Component Value Date/Time   CHOL 159 03/04/2014 1101   TRIG 179* 03/04/2014 1101   HDL 35*  03/04/2014 1101   CHOLHDL 4.5 03/04/2014 1101   VLDL 36 03/04/2014 1101   LDLCALC 88 03/04/2014 1101       Assessment and plan:   Torri was seen today for back pain.  Diagnoses and all orders for this visit:  Neuropathy ([place]) -     POCT glycosylated hemoglobin (Hb A1C)  likely from back injury and pinched nerves.  Chronic neck pain -     gabapentin (NEURONTIN) 300 MG capsule; Take 2 capsules (600 mg total) by mouth 3 (three) times daily.  Essential hypertension -     cloNIDine (CATAPRES) 0.1 MG tablet; TAKE ONE TABLET BY MOUTH THREE TIMES DAILY -     amLODipine (NORVASC) 10 MG tablet; Take 1 tablet (10 mg total) by mouth daily. Patient blood pressure is stable and may continue on current medication.  Education on diet, exercise, and modifiable risk factors discussed. Will obtain appropriate labs as needed. Will follow up in 3-6 months.   Cough -     Begin albuterol (PROVENTIL HFA;VENTOLIN HFA) 108 (90 Base) MCG/ACT inhaler; Inhale 2 puffs into the lungs every 6 (six) hours as needed for wheezing or shortness of breath. Smoking cessation discussed  Tobacco use disorder Smoking cessation discussed for 3 minutes, patient is not willing to quit at this time. Will continue to assess on each visit. Discussed increased risk for diseases such as cancer, heart disease, and stroke.      Return in about 3 months (around 08/11/2015).   Lance Bosch, [facility] [phone] 05/14/2015, 10:53 AM

## 2015-05-14 NOTE — Patient Instructions (Signed)
Please give patient flyer to [facility]

## 2015-05-14 NOTE — Progress Notes (Signed)
Patient complains of back/neck pain  Patient states she is also having some burning to her feet at night Patient states she has stopped her methadone and going through some "wicked" withdraws

## 2015-08-12 ENCOUNTER — Ambulatory Visit: Payer: Self-pay | Attending: Family Medicine | Admitting: Family Medicine

## 2015-08-12 ENCOUNTER — Encounter: Payer: Self-pay | Admitting: Family Medicine

## 2015-08-12 VITALS — BP 176/91 | HR 59 | Temp 97.8°F | Resp 16 | Ht 64.0 in | Wt 190.0 lb

## 2015-08-12 DIAGNOSIS — Z1159 Encounter for screening for other viral diseases: Secondary | ICD-10-CM

## 2015-08-12 DIAGNOSIS — R059 Cough, unspecified: Secondary | ICD-10-CM

## 2015-08-12 DIAGNOSIS — R0602 Shortness of breath: Secondary | ICD-10-CM | POA: Insufficient documentation

## 2015-08-12 DIAGNOSIS — M549 Dorsalgia, unspecified: Secondary | ICD-10-CM | POA: Insufficient documentation

## 2015-08-12 DIAGNOSIS — K529 Noninfective gastroenteritis and colitis, unspecified: Secondary | ICD-10-CM | POA: Insufficient documentation

## 2015-08-12 DIAGNOSIS — F17200 Nicotine dependence, unspecified, uncomplicated: Secondary | ICD-10-CM

## 2015-08-12 DIAGNOSIS — G8929 Other chronic pain: Secondary | ICD-10-CM | POA: Insufficient documentation

## 2015-08-12 DIAGNOSIS — Z0000 Encounter for general adult medical examination without abnormal findings: Secondary | ICD-10-CM

## 2015-08-12 DIAGNOSIS — Z79899 Other long term (current) drug therapy: Secondary | ICD-10-CM | POA: Insufficient documentation

## 2015-08-12 DIAGNOSIS — I10 Essential (primary) hypertension: Secondary | ICD-10-CM | POA: Insufficient documentation

## 2015-08-12 DIAGNOSIS — R05 Cough: Secondary | ICD-10-CM | POA: Insufficient documentation

## 2015-08-12 DIAGNOSIS — F17210 Nicotine dependence, cigarettes, uncomplicated: Secondary | ICD-10-CM | POA: Insufficient documentation

## 2015-08-12 LAB — COMPLETE METABOLIC PANEL WITH GFR
ALBUMIN: 4.6 g/dL (ref 3.6–5.1)
ALK PHOS: 80 U/L (ref 33–130)
ALT: 18 U/L (ref 6–29)
AST: 19 U/L (ref 10–35)
BUN: 9 mg/dL (ref 7–25)
CO2: 23 mmol/L (ref 20–31)
Calcium: 9.7 mg/dL (ref 8.6–10.4)
Chloride: 105 mmol/L (ref 98–110)
Creat: 0.84 mg/dL (ref 0.50–1.05)
GFR, Est African American: 89 mL/min (ref >=60)
GFR, Est Non African American: 77 mL/min (ref >=60)
GLUCOSE: 89 mg/dL (ref 65–99)
POTASSIUM: 3.8 mmol/L (ref 3.5–5.3)
SODIUM: 140 mmol/L (ref 135–146)
Total Bilirubin: 0.4 mg/dL (ref 0.2–1.2)
Total Protein: 7.2 g/dL (ref 6.1–8.1)

## 2015-08-12 LAB — LIPID PANEL
Cholesterol: 197 mg/dL (ref 125–200)
HDL: 42 mg/dL — AB (ref >=46)
LDL CALC: 111 mg/dL (ref <130)
Total CHOL/HDL Ratio: 4.7 Ratio (ref <=5.0)
Triglycerides: 218 mg/dL — ABNORMAL HIGH (ref <150)
VLDL: 44 mg/dL — AB (ref <30)

## 2015-08-12 LAB — MAGNESIUM: Magnesium: 2 mg/dL (ref 1.5–2.5)

## 2015-08-12 LAB — CBC
HEMATOCRIT: 42.5 % (ref 35.0–45.0)
HEMOGLOBIN: 14.6 g/dL (ref 11.7–15.5)
MCH: 28.4 pg (ref 27.0–33.0)
MCHC: 34.4 g/dL (ref 32.0–36.0)
MCV: 82.7 fL (ref 80.0–100.0)
MPV: 9.4 fL (ref 7.5–12.5)
Platelets: 341 10*3/uL (ref 140–400)
RBC: 5.14 MIL/uL — AB (ref 3.80–5.10)
RDW: 14.5 % (ref 11.0–15.0)
WBC: 11.4 10*3/uL — ABNORMAL HIGH (ref 3.8–10.8)

## 2015-08-12 LAB — HEMOGLOBIN A1C
HEMOGLOBIN A1C: 6.2 % — AB (ref <5.7)
Mean Plasma Glucose: 131 mg/dL

## 2015-08-12 MED ORDER — HYDRALAZINE HCL 10 MG PO TABS: 10.0000 mg | ORAL_TABLET | Freq: Four times a day (QID) | ORAL | Status: DC

## 2015-08-12 MED ORDER — FLUTICASONE-SALMETEROL 100-50 MCG/DOSE IN AEPB: 1.0000 | INHALATION_SPRAY | Freq: Two times a day (BID) | RESPIRATORY_TRACT | Status: DC

## 2015-08-12 MED ORDER — ACETAMINOPHEN-CODEINE #3 300-30 MG PO TABS: 1.0000 | ORAL_TABLET | Freq: Three times a day (TID) | ORAL | Status: DC | PRN

## 2015-08-12 MED ORDER — METHYLPREDNISOLONE ACETATE 80 MG/ML IJ SUSP
80.0000 mg | Freq: Once | INTRAMUSCULAR | Status: AC
  Administered 2015-08-12: 80 mg via INTRAMUSCULAR

## 2015-08-12 MED ORDER — LOSARTAN POTASSIUM 50 MG PO TABS: 50.0000 mg | ORAL_TABLET | Freq: Every day | ORAL | Status: DC

## 2015-08-12 MED ORDER — ALBUTEROL SULFATE HFA 108 (90 BASE) MCG/ACT IN AERS: 2.0000 | INHALATION_SPRAY | Freq: Four times a day (QID) | RESPIRATORY_TRACT | Status: DC | PRN

## 2015-08-12 MED ORDER — DIPHENOXYLATE-ATROPINE 2.5-0.025 MG PO TABS: 1.0000 | ORAL_TABLET | Freq: Four times a day (QID) | ORAL | Status: DC | PRN

## 2015-08-12 MED ORDER — METHYLPREDNISOLONE ACETATE 80 MG/ML IJ SUSP: 80.0000 mg | Freq: Once | INTRAMUSCULAR | Status: DC

## 2015-08-12 MED FILL — LOSARTAN POTASSIUM 50 MG TA: 50 | 30 days supply | Qty: 30 | Fill #0

## 2015-08-12 MED FILL — !ADVAIR 100/50 DISKUS: 100-50 | 30 days supply | Qty: 60 | Fill #0

## 2015-08-12 MED FILL — hydrALAZINE HCL 10 MG TABS: 10 | 30 days supply | Qty: 120 | Fill #0

## 2015-08-12 MED FILL — ACETAMINOPHEN/COD #3 TABLET: 300-30 | 20 days supply | Qty: 60 | Fill #0

## 2015-08-12 NOTE — Progress Notes (Signed)
Back pain x 6 month Pain scale #7 Tobacco user -8 cigarette per day  No suicidal thoughts in the past two weeks

## 2015-08-12 NOTE — Progress Notes (Signed)
Subjective:  Patient ID: Charlene Allen, female    DOB: [birth year]  Age: 60 y.o. MRN: [id number]  CC: Back Pain and Hypertension   HPI Charlene Allen presents for    1. CHRONIC HYPERTENSION  Disease Monitoring  Blood pressure range: not checking   Chest pain: no   Dyspnea: yes, smoker, chronic SOB   Claudication: no   Medication compliance: no  Medication Side Effects  Lightheadedness: no   Urinary frequency: no   Edema: no     2. Chronic low back pain: since 2011. Steady achy in bilateral low back worse on L. Non-radiating. Presents since 2011 following fall down steps. Getting worse ever since. Taking OTC meds now without relief . She admist to smoking marijuana sometimes but not street drugs. She denies weakness in legs. She denies urinary incontinence. She admits to chronic diarrhea.   Lumbar MRI 03/27/2014 IMPRESSION: No advanced stenosis or neural compression. Mild narrowing of the lateral recesses and foramina at L4-5 without gross neural compression.  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  3. Chronic diarrhea: frequent loose watery stools for the past 3 months. Has hx of GI upset since late teens/early 20s. Reports colonoscopy x 2 that were normal. Denies fever, chills, abdominal pain, blood in stool.   Social History  Substance Use Topics  . Smoking status: Current Every Day Smoker -- 1.00 packs/day for 40 years  . Smokeless tobacco: Not on file  . Alcohol Use: No    Outpatient Prescriptions Prior to Visit  Medication Sig Dispense Refill  . albuterol (PROVENTIL HFA;VENTOLIN HFA) 108 (90 Base) MCG/ACT inhaler Inhale 2 puffs into the lungs every 6 (six) hours as needed for wheezing or shortness of breath. 1 Inhaler 3  . amLODipine (NORVASC) 10 MG tablet Take 1 tablet (10 mg total) by mouth daily. 90 tablet 0  . cloNIDine (CATAPRES) 0.1 MG tablet TAKE ONE TABLET BY MOUTH THREE  TIMES DAILY 90 tablet 0  . gabapentin (NEURONTIN) 300 MG capsule Take 2 capsules (600 mg total) by mouth 3 (three) times daily. 180 capsule 3  . methadone (DOLOPHINE) 10 MG tablet Take 80 mg by mouth daily. Reported on 08/12/2015     No facility-administered medications prior to visit.    ROS Review of Systems  Constitutional: Negative for fever and chills.  Eyes: Negative for visual disturbance.  Respiratory: Positive for shortness of breath.   Cardiovascular: Negative for chest pain.  Gastrointestinal: Positive for diarrhea. Negative for nausea, vomiting, abdominal pain, constipation, blood in stool, abdominal distention, anal bleeding and rectal pain.  Musculoskeletal: Positive for back pain. Negative for arthralgias.  Skin: Negative for rash.  Allergic/Immunologic: Negative for immunocompromised state.  Neurological: Positive for headaches.  Hematological: Negative for adenopathy. Does not bruise/bleed easily.  Psychiatric/Behavioral: Negative for suicidal ideas and dysphoric mood.    Objective:  BP 176/91 mmHg  Pulse 59  Temp(Src) 97.8 F (36.6 C) (Oral)  Resp 16  Ht 5\' 4"  (1.626 m)  Wt 190 lb (86.183 kg)  BMI 32.60 kg/m2  SpO2 100%  BP/Weight 08/12/2015 Q000111Q [id number]  Systolic BP 0000000 Q000111Q [id number]  Diastolic BP 91 78 74  Wt. (Lbs) 190 183 195.6  BMI 32.6 31.4 34.66   Pulse Readings from Last 3 Encounters:  08/12/15 59  05/14/15 74  09/02/14 64    Physical Exam  Constitutional: She is oriented to person, place, and time. She appears well-developed and well-nourished. No  distress.  HENT:  Head: Normocephalic and atraumatic.  Cardiovascular: Normal rate, regular rhythm, normal heart sounds and intact distal pulses.   Pulmonary/Chest: Effort normal and breath sounds normal.  Abdominal: Soft. Bowel sounds are normal. She exhibits no distension and no mass. There is no tenderness. There is no rebound and no guarding.  Musculoskeletal: She exhibits no edema.  Back  Exam: Back: Normal Curvature, no deformities or CVA tenderness  Paraspinal Tenderness: b/l lumbar   LE Strength 5/5  LE Sensation: in tact  LE Reflexes 2+ and symmetric  Straight leg raise: negative    Neurological: She is alert and oriented to person, place, and time.  Skin: Skin is warm and dry. No rash noted.  Psychiatric: She has a normal mood and affect.     Assessment & Plan:   There are no diagnoses linked to this encounter. Avorie was seen today for back pain and hypertension.  Diagnoses and all orders for this visit:  Chronic back pain -     acetaminophen-codeine (TYLENOL #3) 300-30 MG tablet; Take 1 tablet by mouth every 8 (eight) hours as needed for moderate pain. -     COMPLETE METABOLIC PANEL WITH GFR -     CBC -     Magnesium -     Discontinue: methylPREDNISolone acetate (DEPO-MEDROL) injection 80 mg; Inject 1 mL (80 mg total) into the articular space once. -     methylPREDNISolone acetate (DEPO-MEDROL) injection 80 mg; Inject 1 mL (80 mg total) into the muscle once.  Essential hypertension, benign -     Discontinue: losartan (COZAAR) 50 MG tablet; Take 1 tablet (50 mg total) by mouth daily. -     Discontinue: hydrALAZINE (APRESOLINE) 10 MG tablet; Take 1 tablet (10 mg total) by mouth 4 (four) times daily. -     COMPLETE METABOLIC PANEL WITH GFR -     Lipid Panel -     Hemoglobin A1c -     losartan (COZAAR) 50 MG tablet; Take 1 tablet (50 mg total) by mouth daily. -     hydrALAZINE (APRESOLINE) 10 MG tablet; Take 1 tablet (10 mg total) by mouth 4 (four) times daily. -     DG Chest 2 View; Future  Tobacco use disorder -     Discontinue: Fluticasone-Salmeterol (ADVAIR) 100-50 MCG/DOSE AEPB; Inhale 1 puff into the lungs 2 (two) times daily. -     Discontinue: albuterol (PROVENTIL HFA;VENTOLIN HFA) 108 (90 Base) MCG/ACT inhaler; Inhale 2 puffs into the lungs every 6 (six) hours as needed for wheezing or shortness of breath. -     Fluticasone-Salmeterol (ADVAIR)  100-50 MCG/DOSE AEPB; Inhale 1 puff into the lungs 2 (two) times daily. -     albuterol (PROVENTIL HFA;VENTOLIN HFA) 108 (90 Base) MCG/ACT inhaler; Inhale 2 puffs into the lungs every 6 (six) hours as needed for wheezing or shortness of breath. -     Ambulatory referral to Pulmonology  Cough -     Discontinue: Fluticasone-Salmeterol (ADVAIR) 100-50 MCG/DOSE AEPB; Inhale 1 puff into the lungs 2 (two) times daily. -     Discontinue: albuterol (PROVENTIL HFA;VENTOLIN HFA) 108 (90 Base) MCG/ACT inhaler; Inhale 2 puffs into the lungs every 6 (six) hours as needed for wheezing or shortness of breath. -     Fluticasone-Salmeterol (ADVAIR) 100-50 MCG/DOSE AEPB; Inhale 1 puff into the lungs 2 (two) times daily. -     albuterol (PROVENTIL HFA;VENTOLIN HFA) 108 (90 Base) MCG/ACT inhaler; Inhale 2 puffs into the lungs every  6 (six) hours as needed for wheezing or shortness of breath. -     Ambulatory referral to Pulmonology  Chronic diarrhea -     diphenoxylate-atropine (LOMOTIL) 2.5-0.025 MG tablet; Take 1 tablet by mouth 4 (four) times daily as needed for diarrhea or loose stools. -     Ambulatory referral to Gastroenterology  Need for hepatitis C screening test -     Hepatitis C antibody, reflex  Healthcare maintenance -     Ambulatory referral to Gastroenterology  SOB (shortness of breath) -     DG Chest 2 View; Future  Other orders -     Hepatitis C antibody   Meds ordered this encounter  Medications  . acetaminophen-codeine (TYLENOL #3) 300-30 MG tablet    Sig: Take 1 tablet by mouth every 8 (eight) hours as needed for moderate pain.    Dispense:  60 tablet    Refill:  0  . DISCONTD: losartan (COZAAR) 50 MG tablet    Sig: Take 1 tablet (50 mg total) by mouth daily.    Dispense:  90 tablet    Refill:  3  . DISCONTD: hydrALAZINE (APRESOLINE) 10 MG tablet    Sig: Take 1 tablet (10 mg total) by mouth 4 (four) times daily.    Dispense:  120 tablet    Refill:  3  . DISCONTD:  Fluticasone-Salmeterol (ADVAIR) 100-50 MCG/DOSE AEPB    Sig: Inhale 1 puff into the lungs 2 (two) times daily.    Dispense:  1 each    Refill:  3  . DISCONTD: albuterol (PROVENTIL HFA;VENTOLIN HFA) 108 (90 Base) MCG/ACT inhaler    Sig: Inhale 2 puffs into the lungs every 6 (six) hours as needed for wheezing or shortness of breath.    Dispense:  1 Inhaler    Refill:  3  . diphenoxylate-atropine (LOMOTIL) 2.5-0.025 MG tablet    Sig: Take 1 tablet by mouth 4 (four) times daily as needed for diarrhea or loose stools.    Dispense:  30 tablet    Refill:  0  . DISCONTD: methylPREDNISolone acetate (DEPO-MEDROL) injection 80 mg    Sig:   . losartan (COZAAR) 50 MG tablet    Sig: Take 1 tablet (50 mg total) by mouth daily.    Dispense:  90 tablet    Refill:  3  . hydrALAZINE (APRESOLINE) 10 MG tablet    Sig: Take 1 tablet (10 mg total) by mouth 4 (four) times daily.    Dispense:  120 tablet    Refill:  3  . Fluticasone-Salmeterol (ADVAIR) 100-50 MCG/DOSE AEPB    Sig: Inhale 1 puff into the lungs 2 (two) times daily.    Dispense:  1 each    Refill:  3  . albuterol (PROVENTIL HFA;VENTOLIN HFA) 108 (90 Base) MCG/ACT inhaler    Sig: Inhale 2 puffs into the lungs every 6 (six) hours as needed for wheezing or shortness of breath.    Dispense:  1 Inhaler    Refill:  3  . methylPREDNISolone acetate (DEPO-MEDROL) injection 80 mg    Sig:     Follow-up: No Follow-up on file.   Boykin Nearing MD

## 2015-08-12 NOTE — Patient Instructions (Addendum)
Charlene Allen was seen today for back pain and hypertension.  Diagnoses and all orders for this visit:  Chronic back pain -     acetaminophen-codeine (TYLENOL #3) 300-30 MG tablet; Take 1 tablet by mouth every 8 (eight) hours as needed for moderate pain. -     COMPLETE METABOLIC PANEL WITH GFR -     CBC -     Magnesium -     methylPREDNISolone acetate (DEPO-MEDROL) injection 80 mg; Inject 1 mL (80 mg total) into the articular space once.  Essential hypertension, benign -     Discontinue: losartan (COZAAR) 50 MG tablet; Take 1 tablet (50 mg total) by mouth daily. -     Discontinue: hydrALAZINE (APRESOLINE) 10 MG tablet; Take 1 tablet (10 mg total) by mouth 4 (four) times daily. -     COMPLETE METABOLIC PANEL WITH GFR -     Lipid Panel -     Hemoglobin A1c -     losartan (COZAAR) 50 MG tablet; Take 1 tablet (50 mg total) by mouth daily. -     hydrALAZINE (APRESOLINE) 10 MG tablet; Take 1 tablet (10 mg total) by mouth 4 (four) times daily. -     DG Chest 2 View; Future  Tobacco use disorder -     Discontinue: Fluticasone-Salmeterol (ADVAIR) 100-50 MCG/DOSE AEPB; Inhale 1 puff into the lungs 2 (two) times daily. -     Discontinue: albuterol (PROVENTIL HFA;VENTOLIN HFA) 108 (90 Base) MCG/ACT inhaler; Inhale 2 puffs into the lungs every 6 (six) hours as needed for wheezing or shortness of breath. -     Fluticasone-Salmeterol (ADVAIR) 100-50 MCG/DOSE AEPB; Inhale 1 puff into the lungs 2 (two) times daily. -     albuterol (PROVENTIL HFA;VENTOLIN HFA) 108 (90 Base) MCG/ACT inhaler; Inhale 2 puffs into the lungs every 6 (six) hours as needed for wheezing or shortness of breath. -     Ambulatory referral to Pulmonology  Cough -     Discontinue: Fluticasone-Salmeterol (ADVAIR) 100-50 MCG/DOSE AEPB; Inhale 1 puff into the lungs 2 (two) times daily. -     Discontinue: albuterol (PROVENTIL HFA;VENTOLIN HFA) 108 (90 Base) MCG/ACT inhaler; Inhale 2 puffs into the lungs every 6 (six) hours as needed for  wheezing or shortness of breath. -     Fluticasone-Salmeterol (ADVAIR) 100-50 MCG/DOSE AEPB; Inhale 1 puff into the lungs 2 (two) times daily. -     albuterol (PROVENTIL HFA;VENTOLIN HFA) 108 (90 Base) MCG/ACT inhaler; Inhale 2 puffs into the lungs every 6 (six) hours as needed for wheezing or shortness of breath. -     Ambulatory referral to Pulmonology  Chronic diarrhea -     diphenoxylate-atropine (LOMOTIL) 2.5-0.025 MG tablet; Take 1 tablet by mouth 4 (four) times daily as needed for diarrhea or loose stools. -     Ambulatory referral to Gastroenterology  Need for hepatitis C screening test -     Hepatitis C antibody, reflex  Healthcare maintenance -     Ambulatory referral to Gastroenterology  SOB (shortness of breath) -     DG Chest 2 View; Future   Smoking cessation support: smoking cessation hotline: 1-800-QUIT-NOW.  Smoking cessation classes are available through [facility]. Call [phone] or visit our website at [email or web address]   F/u  With me in 2 weeks for BP check and pap  Dr. Adrian Blackwater

## 2015-08-13 DIAGNOSIS — R0602 Shortness of breath: Secondary | ICD-10-CM | POA: Insufficient documentation

## 2015-08-13 DIAGNOSIS — R05 Cough: Secondary | ICD-10-CM | POA: Insufficient documentation

## 2015-08-13 DIAGNOSIS — R059 Cough, unspecified: Secondary | ICD-10-CM | POA: Insufficient documentation

## 2015-08-13 DIAGNOSIS — F17210 Nicotine dependence, cigarettes, uncomplicated: Secondary | ICD-10-CM | POA: Insufficient documentation

## 2015-08-13 DIAGNOSIS — K529 Noninfective gastroenteritis and colitis, unspecified: Secondary | ICD-10-CM | POA: Insufficient documentation

## 2015-08-13 LAB — HEPATITIS C ANTIBODY: HCV AB: NEGATIVE

## 2015-08-13 MED ORDER — ATORVASTATIN CALCIUM 20 MG PO TABS: 20.0000 mg | ORAL_TABLET | Freq: Every day | ORAL | Status: DC

## 2015-08-13 NOTE — Assessment & Plan Note (Signed)
Chronic diarrhea Without bloody stools or weight loss  Plan: Labs per orders Lomotil GI referral for c-scope

## 2015-08-13 NOTE — Assessment & Plan Note (Signed)
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

## 2015-08-13 NOTE — Assessment & Plan Note (Signed)
HTN due to med non compliance Patient denies drug use other than THC Bradycardia noted on exam  Plan: Losartan and hydralazine Labs per orders

## 2015-08-13 NOTE — Assessment & Plan Note (Signed)
SOB and cough in smoker Cessation discussed Refilled albuterol Add Advair pulm referral

## 2015-08-13 NOTE — Addendum Note (Signed)
Addended by: Boykin Nearing on: 08/13/2015 11:24 PM   Modules accepted: Orders, SmartSet

## 2015-08-14 NOTE — Addendum Note (Signed)
Addended by: Boykin Nearing on: 08/14/2015 05:37 PM   Modules accepted: Orders, SmartSet

## 2015-08-28 ENCOUNTER — Telehealth: Payer: Self-pay | Admitting: *Deleted

## 2015-08-28 ENCOUNTER — Other Ambulatory Visit: Payer: Self-pay | Admitting: Family Medicine

## 2015-08-28 ENCOUNTER — Institutional Professional Consult (permissible substitution): Payer: Self-pay | Admitting: Internal Medicine

## 2015-08-28 MED FILL — ?ATORVASTATIN 20 MG TABLET: 20 | 30 days supply | Qty: 30 | Fill #0

## 2015-08-28 NOTE — Telephone Encounter (Signed)
-----   Message from Boykin Nearing, MD sent at 08/13/2015 11:23 PM EDT ----- Screening hep C negative Screening A1c is high normal, pre-diabetes, work on weight loss and reduce sugar in diet to prevent diabetes  Electrolytes are normal Cholesterol panel and risk factors (smoking, HTN)  place patient at higher risk for cardiovascular disease recommend lipitor 20 mg daily to lower risk, added tp med list Also please go for CXR as pre-visit planning with pulmonologist

## 2015-08-28 NOTE — Telephone Encounter (Signed)
Date of birth verified by pt  Lab results given  A1c elevated then normal, pre diabetes. Work on [employer] lost and reduce sugar in diet  Recommended Lipitor 20 mg daily to lower risk of cardiovascular Disease  Pt verbalized understanding  Advised to go for X-ray.

## 2015-08-29 ENCOUNTER — Ambulatory Visit (HOSPITAL_COMMUNITY)
Admission: RE | Admit: 2015-08-29 | Discharge: 2015-08-29 | Disposition: A | Discharge status: Home / Self Care | Payer: Self-pay | Source: Ambulatory Visit | Attending: Family Medicine | Admitting: Family Medicine

## 2015-08-29 ENCOUNTER — Telehealth: Payer: Self-pay | Admitting: Family Medicine

## 2015-08-29 DIAGNOSIS — R918 Other nonspecific abnormal finding of lung field: Secondary | ICD-10-CM | POA: Insufficient documentation

## 2015-08-29 DIAGNOSIS — I10 Essential (primary) hypertension: Secondary | ICD-10-CM

## 2015-08-29 DIAGNOSIS — F17200 Nicotine dependence, unspecified, uncomplicated: Secondary | ICD-10-CM | POA: Insufficient documentation

## 2015-08-29 DIAGNOSIS — R0602 Shortness of breath: Secondary | ICD-10-CM | POA: Insufficient documentation

## 2015-08-29 DIAGNOSIS — M549 Dorsalgia, unspecified: Principal | ICD-10-CM

## 2015-08-29 DIAGNOSIS — M542 Cervicalgia: Secondary | ICD-10-CM

## 2015-08-29 DIAGNOSIS — I119 Hypertensive heart disease without heart failure: Secondary | ICD-10-CM | POA: Insufficient documentation

## 2015-08-29 DIAGNOSIS — G8929 Other chronic pain: Secondary | ICD-10-CM

## 2015-08-29 NOTE — Telephone Encounter (Signed)
Pt. came into facility requesting a refill on Tylenol # 3. Please f/u with pt.

## 2015-09-01 MED ORDER — ACETAMINOPHEN-CODEINE #3 300-30 MG PO TABS: 1.0000 | ORAL_TABLET | Freq: Three times a day (TID) | ORAL | Status: DC | PRN

## 2015-09-01 NOTE — Telephone Encounter (Signed)
Tylenol #3 refilled Increased to 90 tablets since 60 lasted for just a little over 2 weeks Please inform patient. Also patient will need to sign controlled substance contract

## 2015-09-02 ENCOUNTER — Other Ambulatory Visit: Payer: Self-pay | Admitting: Family Medicine

## 2015-09-02 DIAGNOSIS — I517 Cardiomegaly: Secondary | ICD-10-CM

## 2015-09-02 DIAGNOSIS — R05 Cough: Secondary | ICD-10-CM

## 2015-09-02 DIAGNOSIS — R059 Cough, unspecified: Secondary | ICD-10-CM

## 2015-09-02 DIAGNOSIS — R0602 Shortness of breath: Secondary | ICD-10-CM

## 2015-09-02 DIAGNOSIS — I10 Essential (primary) hypertension: Secondary | ICD-10-CM

## 2015-09-03 ENCOUNTER — Telehealth: Payer: Self-pay | Admitting: *Deleted

## 2015-09-03 NOTE — Telephone Encounter (Signed)
-----   Message from Boykin Nearing, MD sent at 09/02/2015  2:18 PM EDT ----- Enlarged heart on CXR There is possibly some congestive heart failure to to uncontrolled hypertension ECHO cardiogram ordered, please schedule and call patient with appointment

## 2015-09-03 NOTE — Telephone Encounter (Signed)
  ECHO schedule

## 2015-09-03 NOTE — Telephone Encounter (Signed)
LVM Rx and agreement form at front office ready  Need to read and sign form

## 2015-09-03 NOTE — Telephone Encounter (Signed)
Date of birth verified by pt  Enlarge heart on CXR  ECHO Appointment on Monday 12 at 3:00 at [facility] cardiovascular lab  Pt verbalized understanding

## 2015-09-08 ENCOUNTER — Ambulatory Visit (HOSPITAL_COMMUNITY)
Admission: RE | Admit: 2015-09-08 | Discharge: 2015-09-08 | Disposition: A | Discharge status: Home / Self Care | Payer: Self-pay | Source: Ambulatory Visit | Attending: Family Medicine | Admitting: Family Medicine

## 2015-09-08 DIAGNOSIS — I119 Hypertensive heart disease without heart failure: Secondary | ICD-10-CM | POA: Insufficient documentation

## 2015-09-08 DIAGNOSIS — R05 Cough: Secondary | ICD-10-CM | POA: Insufficient documentation

## 2015-09-08 DIAGNOSIS — I517 Cardiomegaly: Secondary | ICD-10-CM

## 2015-09-08 DIAGNOSIS — R059 Cough, unspecified: Secondary | ICD-10-CM

## 2015-09-08 DIAGNOSIS — I10 Essential (primary) hypertension: Secondary | ICD-10-CM

## 2015-09-08 DIAGNOSIS — R0602 Shortness of breath: Secondary | ICD-10-CM

## 2015-09-08 LAB — ECHOCARDIOGRAM COMPLETE
E decel time: 296 msec
EERAT: 10.68
FS: 41 % (ref 28–44)
IVS/LV PW RATIO, ED: 1.14
LA ID, A-P, ES: 40 mm
LA diam end sys: 40 mm
LA vol index: 30.2 mL/m2
LADIAMINDEX: 2.09 cm/m2
LAVOL: 57.6 mL
LAVOLA4C: 52.6 mL
LV E/e' medial: 10.68
LV E/e'average: 10.68
LV TDI E'LATERAL: 8.59
LVELAT: 8.59 cm/s
MV Dec: 296
MV pk A vel: 115 m/s
MV pk E vel: 91.7 m/s
MVPG: 3 mmHg
PW: 13.8 mm — AB (ref 0.6–1.1)
RV TAPSE: 22.2 mm
TDI e' medial: 6.09

## 2015-09-09 ENCOUNTER — Ambulatory Visit: Payer: Self-pay | Attending: Family Medicine | Admitting: Family Medicine

## 2015-09-09 ENCOUNTER — Encounter: Payer: Self-pay | Admitting: Family Medicine

## 2015-09-09 ENCOUNTER — Other Ambulatory Visit: Payer: Self-pay

## 2015-09-09 VITALS — BP 137/80 | HR 75 | Temp 98.9°F | Resp 16 | Ht 64.0 in | Wt 188.0 lb

## 2015-09-09 DIAGNOSIS — F17210 Nicotine dependence, cigarettes, uncomplicated: Secondary | ICD-10-CM | POA: Insufficient documentation

## 2015-09-09 DIAGNOSIS — F17200 Nicotine dependence, unspecified, uncomplicated: Secondary | ICD-10-CM

## 2015-09-09 DIAGNOSIS — M549 Dorsalgia, unspecified: Secondary | ICD-10-CM

## 2015-09-09 DIAGNOSIS — I519 Heart disease, unspecified: Secondary | ICD-10-CM | POA: Insufficient documentation

## 2015-09-09 DIAGNOSIS — M542 Cervicalgia: Secondary | ICD-10-CM

## 2015-09-09 DIAGNOSIS — Z79899 Other long term (current) drug therapy: Secondary | ICD-10-CM | POA: Insufficient documentation

## 2015-09-09 DIAGNOSIS — R079 Chest pain, unspecified: Secondary | ICD-10-CM

## 2015-09-09 DIAGNOSIS — G8929 Other chronic pain: Secondary | ICD-10-CM | POA: Insufficient documentation

## 2015-09-09 DIAGNOSIS — R0602 Shortness of breath: Secondary | ICD-10-CM

## 2015-09-09 DIAGNOSIS — K529 Noninfective gastroenteritis and colitis, unspecified: Secondary | ICD-10-CM

## 2015-09-09 DIAGNOSIS — I10 Essential (primary) hypertension: Secondary | ICD-10-CM | POA: Insufficient documentation

## 2015-09-09 DIAGNOSIS — R51 Headache: Secondary | ICD-10-CM | POA: Insufficient documentation

## 2015-09-09 DIAGNOSIS — R072 Precordial pain: Secondary | ICD-10-CM | POA: Insufficient documentation

## 2015-09-09 DIAGNOSIS — R519 Headache, unspecified: Secondary | ICD-10-CM | POA: Insufficient documentation

## 2015-09-09 MED ORDER — ACETAMINOPHEN-CODEINE #3 300-30 MG PO TABS: 1.0000 | ORAL_TABLET | Freq: Four times a day (QID) | ORAL | Status: DC | PRN

## 2015-09-09 MED ORDER — AMITRIPTYLINE HCL 50 MG PO TABS: 50.0000 mg | ORAL_TABLET | Freq: Every day | ORAL | Status: DC

## 2015-09-09 MED ORDER — NITROGLYCERIN 0.4 MG SL SUBL: 0.4000 mg | SUBLINGUAL_TABLET | SUBLINGUAL | Status: DC | PRN

## 2015-09-09 MED ORDER — DIPHENOXYLATE-ATROPINE 2.5-0.025 MG PO TABS: 1.0000 | ORAL_TABLET | Freq: Four times a day (QID) | ORAL | Status: DC | PRN

## 2015-09-09 MED ORDER — ASPIRIN 81 MG PO TABS: 81.0000 mg | ORAL_TABLET | Freq: Every day | ORAL | Status: DC

## 2015-09-09 MED FILL — ACETAMINOPHEN/COD #3 TABLET: 300-30 | 30 days supply | Qty: 120 | Fill #0

## 2015-09-09 MED FILL — LOSARTAN POTASSIUM 50 MG TA: 50 | 30 days supply | Qty: 30 | Fill #1

## 2015-09-09 MED FILL — **ADVAIR 100/50 DISKUS: 100-50 MCG | 10 days supply | Qty: 14 | Fill #1

## 2015-09-09 MED FILL — !VENTOLIN HFA INHALER: 108 (90 BAS | 30 days supply | Qty: 18 | Fill #0

## 2015-09-09 MED FILL — ?AMITRIPTYLINE HCL 50MG TAB: 50 | 30 days supply | Qty: 30 | Fill #0

## 2015-09-09 MED FILL — NITROSTAT 0.4 MG TABLET SL: 0.4 | 25 days supply | Qty: 25 | Fill #0

## 2015-09-09 NOTE — Progress Notes (Signed)
Subjective:  Patient ID: Charlene Allen, female    DOB: 1955-12-03  Age: 60 y.o. MRN: [id number]  CC: Hypertension and Headache   HPI Charlene Allen has HTN, chronic low back pain, smoker, chronic diarrhea she presents for    1. HTN f/u: she is compliant with hydralazine and losartan. She denies visions changes. She has cough, HA, SOB and CP.   2. Chest pain: substernal. Sharp pains. Comes and goes. Has slight chest pain today. Also has some yesterday. Relieved with rest. No dizziness, lightheadedness and sweating. Admits to being worried about her heart and was awaiting ECHO results. 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 She has fam hx of CAD in her mother.   3. Headache: frontal. Pressure. No fever or chills. Has chronic nasal congestion.   4. SOB: has asthma. longterm smoker. Down to 5-6 cigs per day. Working to quit and awaiting patches. Feels that Advair is not helping with SOB. Still using albuterol but has not had any this AM. Feels SOB and has CP now.     Social History  Substance Use Topics  . Smoking status: Current Every Day Smoker -- 1.00 packs/day for 40 years  . Smokeless tobacco: Not on file  . Alcohol Use: No    Outpatient Prescriptions Prior to Visit  Medication Sig Dispense Refill  . acetaminophen-codeine (TYLENOL #3) 300-30 MG tablet Take 1 tablet by mouth every 8 (eight) hours as needed for moderate pain. 90 tablet 0  . albuterol (PROVENTIL HFA;VENTOLIN HFA) 108 (90 Base) MCG/ACT inhaler Inhale 2 puffs into the lungs every 6 (six) hours as needed for wheezing or shortness of breath. 1 Inhaler 3  . atorvastatin (LIPITOR) 20 MG tablet Take 1 tablet (20 mg total) by mouth daily. 90 tablet 3  . diphenoxylate-atropine (LOMOTIL) 2.5-0.025 MG tablet Take 1 tablet by mouth 4 (four) times daily as needed for diarrhea or loose stools. 30 tablet 0  . Fluticasone-Salmeterol (ADVAIR) 100-50 MCG/DOSE AEPB Inhale 1 puff  into the lungs 2 (two) times daily. 1 each 3  . hydrALAZINE (APRESOLINE) 10 MG tablet Take 1 tablet (10 mg total) by mouth 4 (four) times daily. 120 tablet 3  . losartan (COZAAR) 50 MG tablet Take 1 tablet (50 mg total) by mouth daily. 90 tablet 3   No facility-administered medications prior to visit.    ROS Review of Systems  Constitutional: Negative for fever and chills.  Eyes: Negative for visual disturbance.  Respiratory: Positive for shortness of breath.   Cardiovascular: Positive for chest pain.  Gastrointestinal: Positive for diarrhea. Negative for nausea, vomiting, abdominal pain, constipation, blood in stool, abdominal distention, anal bleeding and rectal pain.  Musculoskeletal: Positive for back pain. Negative for arthralgias.  Skin: Negative for rash.  Allergic/Immunologic: Negative for immunocompromised state.  Neurological: Positive for headaches.  Hematological: Negative for adenopathy. Does not bruise/bleed easily.  Psychiatric/Behavioral: Negative for suicidal ideas and dysphoric mood.    Objective:  BP 137/80 mmHg  Pulse 75  Temp(Src) 98.9 F (37.2 C) (Oral)  Resp 16  Ht 5\' 4"  (1.626 m)  Wt 188 lb (85.276 kg)  BMI 32.25 kg/m2  SpO2 97%  BP/Weight 09/09/2015 08/12/2015 Q000111Q  Systolic BP 0000000 0000000 Q000111Q  Diastolic BP 80 91 78  Wt. (Lbs) 188 190 183  BMI 32.25 32.6 31.4   Physical Exam  Constitutional: She appears well-developed and well-nourished. No distress.  Cardiovascular: Normal rate, regular rhythm, normal heart sounds and intact distal  pulses.   Pulmonary/Chest: Effort normal and breath sounds normal. No accessory muscle usage. No respiratory distress. She has no wheezes. She has no rales.  Genitourinary: Vagina normal and uterus normal. Pelvic exam was performed with patient prone. There is no rash, tenderness or lesion on the right labia. There is no rash, tenderness or lesion on the left labia. Cervix exhibits no motion tenderness, no discharge and  no friability.  Musculoskeletal: She exhibits no edema.  Lymphadenopathy:       Right: No inguinal adenopathy present.       Left: No inguinal adenopathy present.  Skin: Skin is warm and dry. No rash noted.   EKG: ST depression in V4, V5, V6  With T wave inversions. The T wave inversion is new since 2013. The ST depression has progressed.   She was treated with 5 mg of albuterol.  Repeat exam with improved air movement. She no longer has chest pain. She feels a bit jittery.    Depression screen [facility] 2/9 09/09/2015 09/09/2015 09/09/2015 08/12/2015 09/02/2014  Decreased Interest 2 0 2 3 0  Down, Depressed, Hopeless 1 0 1 1 0  PHQ - 2 Score 3 0 3 4 0  Altered sleeping 1 0 1 0 -  Tired, decreased energy 3 1 3 2  -  Change in appetite 3 0 3 2 -  Feeling bad or failure about yourself  2 0 2 0 -  Trouble concentrating 0 0 0 0 -  Moving slowly or fidgety/restless 3 0 3 1 -  Suicidal thoughts 0 0 0 0 -  PHQ-9 Score 15 1 15 9  -   GAD 7 : Generalized Anxiety Score 09/09/2015 08/12/2015  Nervous, Anxious, on Edge 1 1  Control/stop worrying 0 3  Worry too much - different things 1 3  Trouble relaxing 1 3  Restless 1 1  Easily annoyed or irritable 0 0  Afraid - awful might happen 0 1  Total GAD 7 Score 4 12    Assessment & Plan:   There are no diagnoses linked to this encounter. Charlene Allen was seen today for hypertension and headache.  Diagnoses and all orders for this visit:  Essential hypertension, benign  Chest pain, unspecified chest pain type -     aspirin 81 MG tablet; Take 1 tablet (81 mg total) by mouth daily. -     Ambulatory referral to Cardiology -     nitroGLYCERIN (NITROSTAT) 0.4 MG SL tablet; Place 1 tablet (0.4 mg total) under the tongue every 5 (five) minutes as needed for chest pain. If you need more than 2 doses call 911  Chronic neck pain -     amitriptyline (ELAVIL) 50 MG tablet; Take 1 tablet (50 mg total) by mouth at bedtime. -     Discontinue: acetaminophen-codeine  (TYLENOL #3) 300-30 MG tablet; Take 1 tablet by mouth every 6 (six) hours as needed for moderate pain. -     acetaminophen-codeine (TYLENOL #3) 300-30 MG tablet; Take 1 tablet by mouth every 6 (six) hours as needed for moderate pain.  Chronic back pain -     amitriptyline (ELAVIL) 50 MG tablet; Take 1 tablet (50 mg total) by mouth at bedtime. -     Discontinue: acetaminophen-codeine (TYLENOL #3) 300-30 MG tablet; Take 1 tablet by mouth every 6 (six) hours as needed for moderate pain. -     acetaminophen-codeine (TYLENOL #3) 300-30 MG tablet; Take 1 tablet by mouth every 6 (six) hours as needed for moderate pain.  Frontal headache -     amitriptyline (ELAVIL) 50 MG tablet; Take 1 tablet (50 mg total) by mouth at bedtime.  Chronic diarrhea -     Discontinue: diphenoxylate-atropine (LOMOTIL) 2.5-0.025 MG tablet; Take 1 tablet by mouth 4 (four) times daily as needed for diarrhea or loose stools. -     diphenoxylate-atropine (LOMOTIL) 2.5-0.025 MG tablet; Take 1 tablet by mouth 4 (four) times daily as needed for diarrhea or loose stools.  Other orders -     Cancel: Cytology - PAP   No orders of the defined types were placed in this encounter.    Follow-up: No Follow-up on file.   Boykin Nearing MD

## 2015-09-09 NOTE — Assessment & Plan Note (Signed)
BP improved on losartan and hydralazine Plan to continue current regimen

## 2015-09-09 NOTE — Patient Instructions (Addendum)
Charlene Allen was seen today for hypertension and headache.  Diagnoses and all orders for this visit:  Essential hypertension, benign  Chest pain, unspecified chest pain type -     aspirin 81 MG tablet; Take 1 tablet (81 mg total) by mouth daily. -     Ambulatory referral to Cardiology -     nitroGLYCERIN (NITROSTAT) 0.4 MG SL tablet; Place 1 tablet (0.4 mg total) under the tongue every 5 (five) minutes as needed for chest pain. If you need more than 2 doses call 911  Chronic neck pain -     amitriptyline (ELAVIL) 50 MG tablet; Take 1 tablet (50 mg total) by mouth at bedtime. -     Discontinue: acetaminophen-codeine (TYLENOL #3) 300-30 MG tablet; Take 1 tablet by mouth every 6 (six) hours as needed for moderate pain. -     acetaminophen-codeine (TYLENOL #3) 300-30 MG tablet; Take 1 tablet by mouth every 6 (six) hours as needed for moderate pain.  Chronic back pain -     amitriptyline (ELAVIL) 50 MG tablet; Take 1 tablet (50 mg total) by mouth at bedtime. -     Discontinue: acetaminophen-codeine (TYLENOL #3) 300-30 MG tablet; Take 1 tablet by mouth every 6 (six) hours as needed for moderate pain. -     acetaminophen-codeine (TYLENOL #3) 300-30 MG tablet; Take 1 tablet by mouth every 6 (six) hours as needed for moderate pain.  Frontal headache -     amitriptyline (ELAVIL) 50 MG tablet; Take 1 tablet (50 mg total) by mouth at bedtime.  Chronic diarrhea -     Discontinue: diphenoxylate-atropine (LOMOTIL) 2.5-0.025 MG tablet; Take 1 tablet by mouth 4 (four) times daily as needed for diarrhea or loose stools. -     diphenoxylate-atropine (LOMOTIL) 2.5-0.025 MG tablet; Take 1 tablet by mouth 4 (four) times daily as needed for diarrhea or loose stools.  Other orders -     Cancel: Cytology - PAP   I have added daily aspirin and as needed nitroglycerin for your chest pain. I have also referred you to cardiology.   If you have chest pain that does not improve with albuterol or nitroglycerin  especially if it is associated with nausea, vomiting or sweating call 911 immediately.   F/u with me in 4 weeks for chest pain, SOB, back pain and headache   Dr. Adrian Blackwater

## 2015-09-09 NOTE — Progress Notes (Signed)
F/U HTN and results  Pain scale #8 back and HA Tobacco user 6 cigarette per day  No suicidal thought in the past two weeks

## 2015-09-09 NOTE — Assessment & Plan Note (Signed)
Addressed smoking cessation

## 2015-09-09 NOTE — Assessment & Plan Note (Signed)
SOB in smoker, suspect COPD. She does not have significant CHF  SOB improved with albuterol. She has ordered nicotine patches  Plan: Continue advair Continue albuterol Patient has pulm appt in about two weeks for PFTs Encouraged smoking cessation

## 2015-09-26 ENCOUNTER — Institutional Professional Consult (permissible substitution): Payer: Self-pay | Admitting: Internal Medicine

## 2015-10-01 MED FILL — ?ATORVASTATIN 20 MG TABLET: 20 | 30 days supply | Qty: 30 | Fill #1

## 2015-10-01 MED FILL — hydrALAZINE HCL 10 MG TABS: 10 | 30 days supply | Qty: 120 | Fill #1

## 2015-10-01 MED FILL — ACETAMINOPHEN/COD #3 TABLET: 300-30 | 30 days supply | Qty: 90 | Fill #0

## 2015-10-07 MED FILL — ?AMITRIPTYLINE HCL 50MG TAB: 50 | 30 days supply | Qty: 30 | Fill #1

## 2015-10-07 MED FILL — LOSARTAN POTASSIUM 50 MG TA: 50 | 30 days supply | Qty: 30 | Fill #2

## 2015-10-13 ENCOUNTER — Ambulatory Visit: Payer: Self-pay | Attending: Family Medicine | Admitting: Family Medicine

## 2015-10-13 ENCOUNTER — Encounter: Payer: Self-pay | Admitting: Family Medicine

## 2015-10-13 VITALS — BP 159/69 | HR 73 | Temp 98.7°F | Resp 20 | Ht 63.0 in | Wt 190.0 lb

## 2015-10-13 DIAGNOSIS — M549 Dorsalgia, unspecified: Secondary | ICD-10-CM

## 2015-10-13 DIAGNOSIS — M542 Cervicalgia: Secondary | ICD-10-CM | POA: Insufficient documentation

## 2015-10-13 DIAGNOSIS — G8929 Other chronic pain: Secondary | ICD-10-CM | POA: Insufficient documentation

## 2015-10-13 DIAGNOSIS — R51 Headache: Secondary | ICD-10-CM | POA: Insufficient documentation

## 2015-10-13 DIAGNOSIS — F17210 Nicotine dependence, cigarettes, uncomplicated: Secondary | ICD-10-CM | POA: Insufficient documentation

## 2015-10-13 DIAGNOSIS — Z7982 Long term (current) use of aspirin: Secondary | ICD-10-CM | POA: Insufficient documentation

## 2015-10-13 DIAGNOSIS — R519 Headache, unspecified: Secondary | ICD-10-CM

## 2015-10-13 DIAGNOSIS — Z79899 Other long term (current) drug therapy: Secondary | ICD-10-CM | POA: Insufficient documentation

## 2015-10-13 DIAGNOSIS — IMO0002 Reserved for concepts with insufficient information to code with codable children: Secondary | ICD-10-CM | POA: Insufficient documentation

## 2015-10-13 MED ORDER — AMITRIPTYLINE HCL 75 MG PO TABS: 75.0000 mg | ORAL_TABLET | Freq: Every day | ORAL | Status: DC

## 2015-10-13 MED ORDER — PREDNISONE 20 MG PO TABS: ORAL_TABLET | ORAL | Status: DC

## 2015-10-13 MED ORDER — ACETAMINOPHEN-CODEINE #3 300-30 MG PO TABS: 1.0000 | ORAL_TABLET | Freq: Four times a day (QID) | ORAL | Status: DC | PRN

## 2015-10-13 MED FILL — AMITRIPTYLINE HCL 75 MG TAB: 75 | 30 days supply | Qty: 30 | Fill #0

## 2015-10-13 MED FILL — predniSONE 20 MG TABS: 20 | 12 days supply | Qty: 24 | Fill #0

## 2015-10-13 NOTE — Patient Instructions (Addendum)
Charlene Allen was seen today for back pain.  Diagnoses and all orders for this visit:  Chronic back pain -     acetaminophen-codeine (TYLENOL #3) 300-30 MG tablet; Take 1 tablet by mouth every 6 (six) hours as needed for moderate pain. -     predniSONE (DELTASONE) 20 MG tablet; Take every morning with food 60 mg daily for 3 days, 40 mg daily for 3 days, 30 mg daily for 3 days, 20 mg daily for 3 days, 10 mg daily for 3 days then STOP -     amitriptyline (ELAVIL) 75 MG tablet; Take 1 tablet (75 mg total) by mouth at bedtime.  Chronic neck pain -     acetaminophen-codeine (TYLENOL #3) 300-30 MG tablet; Take 1 tablet by mouth every 6 (six) hours as needed for moderate pain. -     predniSONE (DELTASONE) 20 MG tablet; Take every morning with food 60 mg daily for 3 days, 40 mg daily for 3 days, 30 mg daily for 3 days, 20 mg daily for 3 days, 10 mg daily for 3 days then STOP -     amitriptyline (ELAVIL) 75 MG tablet; Take 1 tablet (75 mg total) by mouth at bedtime.  Frontal headache -     amitriptyline (ELAVIL) 75 MG tablet; Take 1 tablet (75 mg total) by mouth at bedtime.    F/u in 2 weeks for back and neck pain  Dr. Adrian Blackwater

## 2015-10-13 NOTE — Progress Notes (Signed)
Subjective:  Patient ID: Charlene Allen, female    DOB: 11-06-1955  Age: 60 y.o. MRN: [id number]  CC: Back Pain   HPI Charlene Allen has HTN, diastolic dysfunction, smoker she presents for    1. Chronic back pain: chronic cervical and lumbar pain. Worsening. 8/10. No weakness in legs. No fecal or urinary incontinence. She is a smoker. Tylenol #3 and elavil are not controlling pain symptoms. She denies dysuria, hematuria, frequency and urgency.   03/27/14 lumbar MRI IMPRESSION: No advanced stenosis or neural compression. Mild narrowing of the lateral recesses and foramina at L4-5 without gross neural compression.  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  11/15/2007 cervical MRI IMPRESSION: 1. 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 2. C5-C6: Central broad-based shallow disc protrusion with bilateral uncovertebral hypertrophy, right greater than left. 3. C3-C4: Central broad based disc bulge.    Social History  Substance Use Topics  . Smoking status: Current Every Day Smoker -- 1.00 packs/day for 40 years  . Smokeless tobacco: Not on file  . Alcohol Use: No    Outpatient Prescriptions Prior to Visit  Medication Sig Dispense Refill  . acetaminophen-codeine (TYLENOL #3) 300-30 MG tablet Take 1 tablet by mouth every 6 (six) hours as needed for moderate pain. 120 tablet 0  . albuterol (PROVENTIL HFA;VENTOLIN HFA) 108 (90 Base) MCG/ACT inhaler Inhale 2 puffs into the lungs every 6 (six) hours as needed for wheezing or shortness of breath. 1 Inhaler 3  . amitriptyline (ELAVIL) 50 MG tablet Take 1 tablet (50 mg total) by mouth at bedtime. 30 tablet 2  . aspirin 81 MG tablet Take 1 tablet (81 mg total) by mouth daily. 90 tablet 3  . atorvastatin (LIPITOR) 20 MG tablet Take  1 tablet (20 mg total) by mouth daily. 90 tablet 3  . diphenoxylate-atropine (LOMOTIL) 2.5-0.025 MG tablet Take 1 tablet by mouth 4 (four) times daily as needed for diarrhea or loose stools. 30 tablet 2  . Fluticasone-Salmeterol (ADVAIR) 100-50 MCG/DOSE AEPB Inhale 1 puff into the lungs 2 (two) times daily. 1 each 3  . hydrALAZINE (APRESOLINE) 10 MG tablet Take 1 tablet (10 mg total) by mouth 4 (four) times daily. 120 tablet 3  . losartan (COZAAR) 50 MG tablet Take 1 tablet (50 mg total) by mouth daily. 90 tablet 3  . nitroGLYCERIN (NITROSTAT) 0.4 MG SL tablet Place 1 tablet (0.4 mg total) under the tongue every 5 (five) minutes as needed for chest pain. If you need more than 2 doses call 911 20 tablet 1   No facility-administered medications prior to visit.    ROS Review of Systems  Constitutional: Negative for fever and chills.  Eyes: Negative for visual disturbance.  Respiratory: Positive for shortness of breath.   Cardiovascular: Positive for chest pain.  Gastrointestinal: Positive for diarrhea. Negative for nausea, vomiting, abdominal pain, constipation, blood in stool, abdominal distention, anal bleeding and rectal pain.  Musculoskeletal: Positive for back pain and neck pain. Negative for arthralgias.  Skin: Negative for rash.  Allergic/Immunologic: Negative for immunocompromised state.  Neurological: Positive for headaches.  Hematological: Negative for adenopathy. Does not bruise/bleed easily.  Psychiatric/Behavioral: Negative for suicidal ideas and dysphoric mood.    Objective:  BP 159/69 mmHg  Pulse 73  Temp(Src) 98.7 F (37.1 C) (Oral)  Resp 20  Ht 5\' 3"  (1.6 m)  Wt 190 lb (86.183 kg)  BMI 33.67 kg/m2  SpO2 100%  BP/Weight 10/13/2015 09/09/2015 [id number]  Systolic BP Q000111Q 0000000 0000000  Diastolic BP 69 80 91  Wt. (Lbs) 190 188 190  BMI 33.67 32.25 32.6   Physical Exam  Constitutional: She is oriented to person, place, and time. She appears well-developed and  well-nourished. No distress.  HENT:  Head: Normocephalic and atraumatic.  Cardiovascular: Normal rate, regular rhythm, normal heart sounds and intact distal pulses.   Pulmonary/Chest: Effort normal and breath sounds normal.  Musculoskeletal: She exhibits no edema.  Back Exam: Back: Normal Curvature, no deformities or CVA tenderness  Paraspinal Tenderness: b/l lumbar   LE Strength 5/5  LE Sensation: in tact  LE Reflexes 2+ and symmetric  Straight leg raise: neg    Neurological: She is alert and oriented to person, place, and time.  Skin: Skin is warm and dry. No rash noted.  Psychiatric: She has a normal mood and affect.     Assessment & Plan:   There are no diagnoses linked to this encounter. Charlene Allen was seen today for back pain.  Diagnoses and all orders for this visit:  Chronic back pain -     acetaminophen-codeine (TYLENOL #3) 300-30 MG tablet; Take 1 tablet by mouth every 6 (six) hours as needed for moderate pain. -     predniSONE (DELTASONE) 20 MG tablet; Take every morning with food 60 mg daily for 3 days, 40 mg daily for 3 days, 30 mg daily for 3 days, 20 mg daily for 3 days, 10 mg daily for 3 days then STOP -     amitriptyline (ELAVIL) 75 MG tablet; Take 1 tablet (75 mg total) by mouth at bedtime.  Chronic neck pain -     acetaminophen-codeine (TYLENOL #3) 300-30 MG tablet; Take 1 tablet by mouth every 6 (six) hours as needed for moderate pain. -     predniSONE (DELTASONE) 20 MG tablet; Take every morning with food 60 mg daily for 3 days, 40 mg daily for 3 days, 30 mg daily for 3 days, 20 mg daily for 3 days, 10 mg daily for 3 days then STOP -     amitriptyline (ELAVIL) 75 MG tablet; Take 1 tablet (75 mg total) by mouth at bedtime.  Frontal headache -     amitriptyline (ELAVIL) 75 MG tablet; Take 1 tablet (75 mg total) by mouth at bedtime.   Meds ordered this encounter  Medications  . acetaminophen-codeine (TYLENOL #3) 300-30 MG tablet    Sig: Take 1 tablet by  mouth every 6 (six) hours as needed for moderate pain.    Dispense:  120 tablet    Refill:  0  . predniSONE (DELTASONE) 20 MG tablet    Sig: Take every morning with food 60 mg daily for 3 days, 40 mg daily for 3 days, 30 mg daily for 3 days, 20 mg daily for 3 days, 10 mg daily for 3 days then STOP    Dispense:  24 tablet    Refill:  0  . amitriptyline (ELAVIL) 75 MG tablet    Sig: Take 1 tablet (75 mg total) by mouth at bedtime.    Dispense:  30 tablet    Refill:  2    Follow-up: No Follow-up on file.   Boykin Nearing MD

## 2015-10-13 NOTE — Progress Notes (Signed)
Patient is here for lower back pain  Patient has taken medication today and patient has not eaten today.  Patient complains of lower back pain being present currently scaled at an 8. Pain is described as constant aching.

## 2015-10-15 NOTE — Assessment & Plan Note (Signed)
Chronic pain without red flags of infection,malignancy or spinal cord compression. Pain is not controlled  Plan Continue elavil and tylenol #3 Course of prednisone

## 2015-10-16 ENCOUNTER — Telehealth: Payer: Self-pay | Admitting: Family Medicine

## 2015-10-16 DIAGNOSIS — G8929 Other chronic pain: Secondary | ICD-10-CM

## 2015-10-16 DIAGNOSIS — M549 Dorsalgia, unspecified: Principal | ICD-10-CM

## 2015-10-16 DIAGNOSIS — M542 Cervicalgia: Secondary | ICD-10-CM

## 2015-10-16 NOTE — Telephone Encounter (Signed)
Pt called to check in on the referral that was discussed at last appt to have an MRI done for pts arthritis in back and neck

## 2015-10-17 NOTE — Telephone Encounter (Signed)
I call Patient and she  was calling regarding a MRI of the Neck and lower back she said that her doctor was thinking about it  Can you please, follow thank you

## 2015-10-17 NOTE — Telephone Encounter (Signed)
Will forward to pcp

## 2015-10-17 NOTE — Telephone Encounter (Signed)
Will forward to referral clerk

## 2015-10-17 NOTE — Telephone Encounter (Signed)
Please call back to patient X-rays ordered first MRI is reasonable after updated x-ray if there is worsening findings on x-ray Keep in mind that MRI is a very expensive study it is best to have some form of insurance or at least the [place] discount if she does not already have it to off set the cost

## 2015-10-20 MED FILL — ACETAMINOPHEN/COD #3 TABLET: 300-30 | 30 days supply | Qty: 120 | Fill #0

## 2015-10-20 NOTE — Telephone Encounter (Signed)
Pt stated she had xrays done on her back on Saturday and it was ordered by disability and the orthopedic doctor order did the scan. Pt states she doesn't have insurance or the [place] discount. Pt states she has to get the paper from iss to renew her orange card.

## 2015-10-21 NOTE — Telephone Encounter (Signed)
Noted. Please ask patient to drop off x-report Get neck x ray done  MRI's ordered as well Please schedule

## 2015-10-22 ENCOUNTER — Telehealth: Payer: Self-pay

## 2015-10-22 NOTE — Telephone Encounter (Signed)
Patient was called on 10/22/2015 @ 2:20. Patient did not answer a message was left on her voicemail to return phone call.

## 2015-10-22 NOTE — Telephone Encounter (Signed)
Patient called back on 10/22/2015 @ 3:16. Patient was informed to bring copy of xray of back and neck to office. Patient was also told once MRI referral is ready she will be informed of the date and time.

## 2015-10-23 ENCOUNTER — Telehealth: Payer: Self-pay

## 2015-10-23 ENCOUNTER — Telehealth: Payer: Self-pay | Admitting: Family Medicine

## 2015-10-23 NOTE — Telephone Encounter (Signed)
MRI was scheduled for the patient on August 9th at 3:00 and 4:00pm

## 2015-10-23 NOTE — Telephone Encounter (Signed)
Please attempt to schedule MRIs She may need to have repeat x-rays but we will try to schedule MRI and see

## 2015-10-23 NOTE — Telephone Encounter (Signed)
Pt calling stating that her x-rays of her back and neck were sent to the [facility]   Pt stating she is not able to obtain a copy of these   Pt wanting to know if she should have another x-ray done or if she can have the MRI without an x-ray  Please follow-up

## 2015-11-04 ENCOUNTER — Ambulatory Visit: Payer: Self-pay | Admitting: Family Medicine

## 2015-11-05 ENCOUNTER — Ambulatory Visit (HOSPITAL_COMMUNITY): Admission: RE | Admit: 2015-11-05 | Payer: Self-pay | Source: Ambulatory Visit

## 2015-11-05 ENCOUNTER — Telehealth: Payer: Self-pay | Admitting: Family Medicine

## 2015-11-05 ENCOUNTER — Ambulatory Visit (HOSPITAL_COMMUNITY): Payer: Self-pay

## 2015-11-05 DIAGNOSIS — M549 Dorsalgia, unspecified: Principal | ICD-10-CM

## 2015-11-05 DIAGNOSIS — G8929 Other chronic pain: Secondary | ICD-10-CM

## 2015-11-05 DIAGNOSIS — M542 Cervicalgia: Secondary | ICD-10-CM

## 2015-11-05 MED ORDER — METHOCARBAMOL 500 MG PO TABS: 500.0000 mg | ORAL_TABLET | Freq: Three times a day (TID) | ORAL | 2 refills | Status: DC

## 2015-11-05 NOTE — Telephone Encounter (Signed)
Robaxin sent to patient's preferred pharmacy

## 2015-11-05 NOTE — Telephone Encounter (Signed)
Pt called requesting muscle relaxer,pt has been having muscle spasms( does not want to take flexeril) if approved pt would like meds sent to [facility] on [address]

## 2015-11-06 ENCOUNTER — Institutional Professional Consult (permissible substitution): Payer: Self-pay | Admitting: Internal Medicine

## 2015-11-12 ENCOUNTER — Other Ambulatory Visit: Payer: Self-pay | Admitting: Family Medicine

## 2015-11-12 DIAGNOSIS — M549 Dorsalgia, unspecified: Principal | ICD-10-CM

## 2015-11-12 DIAGNOSIS — M542 Cervicalgia: Secondary | ICD-10-CM

## 2015-11-12 DIAGNOSIS — G8929 Other chronic pain: Secondary | ICD-10-CM

## 2015-11-12 MED FILL — LOSARTAN POTASSIUM 50 MG TA: 50 | 30 days supply | Qty: 30 | Fill #3

## 2015-11-12 MED FILL — ?ATORVASTATIN 20 MG TABLET: 20 | 30 days supply | Qty: 30 | Fill #2

## 2015-11-13 NOTE — Telephone Encounter (Signed)
PATIENT WAS INFORMED OF PRESCRIPTION BEING READY FOR PICK UP

## 2015-11-13 NOTE — Telephone Encounter (Signed)
Tylenol #3 with 1 refill ordered OV needed in 2 months

## 2015-11-17 ENCOUNTER — Ambulatory Visit (HOSPITAL_COMMUNITY): Admission: RE | Admit: 2015-11-17 | Payer: Self-pay | Source: Ambulatory Visit

## 2015-11-17 ENCOUNTER — Ambulatory Visit (HOSPITAL_COMMUNITY): Payer: Self-pay | Attending: Family Medicine

## 2015-11-18 ENCOUNTER — Ambulatory Visit: Payer: Self-pay | Attending: Family Medicine | Admitting: Family Medicine

## 2015-11-18 ENCOUNTER — Encounter: Payer: Self-pay | Admitting: Family Medicine

## 2015-11-18 VITALS — BP 144/92 | HR 81 | Temp 98.3°F | Ht 63.0 in | Wt 195.4 lb

## 2015-11-18 DIAGNOSIS — F17210 Nicotine dependence, cigarettes, uncomplicated: Secondary | ICD-10-CM | POA: Insufficient documentation

## 2015-11-18 DIAGNOSIS — I10 Essential (primary) hypertension: Secondary | ICD-10-CM

## 2015-11-18 DIAGNOSIS — M542 Cervicalgia: Secondary | ICD-10-CM | POA: Insufficient documentation

## 2015-11-18 DIAGNOSIS — M549 Dorsalgia, unspecified: Secondary | ICD-10-CM | POA: Insufficient documentation

## 2015-11-18 DIAGNOSIS — G8929 Other chronic pain: Secondary | ICD-10-CM

## 2015-11-18 DIAGNOSIS — Z7982 Long term (current) use of aspirin: Secondary | ICD-10-CM | POA: Insufficient documentation

## 2015-11-18 DIAGNOSIS — R079 Chest pain, unspecified: Secondary | ICD-10-CM

## 2015-11-18 DIAGNOSIS — Z0000 Encounter for general adult medical examination without abnormal findings: Secondary | ICD-10-CM

## 2015-11-18 DIAGNOSIS — Z79899 Other long term (current) drug therapy: Secondary | ICD-10-CM | POA: Insufficient documentation

## 2015-11-18 DIAGNOSIS — R0789 Other chest pain: Secondary | ICD-10-CM | POA: Insufficient documentation

## 2015-11-18 DIAGNOSIS — K219 Gastro-esophageal reflux disease without esophagitis: Secondary | ICD-10-CM

## 2015-11-18 DIAGNOSIS — K029 Dental caries, unspecified: Secondary | ICD-10-CM

## 2015-11-18 MED ORDER — NAPROXEN 500 MG PO TABS: 500.0000 mg | ORAL_TABLET | Freq: Two times a day (BID) | ORAL | 2 refills | Status: DC

## 2015-11-18 MED ORDER — DIAZEPAM 5 MG PO TABS: 5.0000 mg | ORAL_TABLET | Freq: Two times a day (BID) | ORAL | 0 refills | Status: DC | PRN

## 2015-11-18 MED ORDER — LOSARTAN POTASSIUM 100 MG PO TABS: 100.0000 mg | ORAL_TABLET | Freq: Every day | ORAL | 3 refills | Status: DC

## 2015-11-18 MED ORDER — RANITIDINE HCL 300 MG PO TABS: 300.0000 mg | ORAL_TABLET | Freq: Every day | ORAL | 5 refills | Status: DC

## 2015-11-18 MED FILL — ACETAMINOPHEN/COD #3 TABLET: 300-30 | 30 days supply | Qty: 120 | Fill #0

## 2015-11-18 NOTE — Assessment & Plan Note (Signed)
Chronic pain in back and neck Continue naproxen, tylenol #3, elavil Add valium

## 2015-11-18 NOTE — Progress Notes (Signed)
Subjective:  Patient ID: Charlene Allen, female    DOB: 04/12/1955  Age: 60 y.o. MRN: [id number]  CC: Back Pain; Neck Pain; and Chest Pain   HPI Aasiyah Schwitzer has HTN, diastolic dysfunction, smoker she presents for   1. Chest pain: had a episode of severe chest pain that started around 4 AM, 3 weeks ago. It lasted 3-4 minutes. The pain is described as pressures. There was no associated SOB, nausea or vomiting. She has not yet taking NTG. She has cardiology appointment on 11/24/15.   1. Chronic back pain: chronic cervical and lumbar pain. Worsening. 8/10. No weakness in legs. No fecal or urinary incontinence. She is a smoker. Tylenol #3 BID and elavil are helping with pain, but not fully controlling pain symptoms. She tried robaxin early in the month with no improvement. She denies dysuria, hematuria, frequency and urgency.   03/27/14 lumbar MRI IMPRESSION: No advanced stenosis or neural compression. Mild narrowing of the lateral recesses and foramina at L4-5 without gross neural compression.  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  11/15/2007 cervical MRI IMPRESSION: 1. 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 2. C5-C6: Central broad-based shallow disc protrusion with bilateral uncovertebral hypertrophy, right greater than left. 3. C3-C4: Central broad based disc bulge.  3. Dental pain: last month she developed swelling in her face. She has pain in molars with chipped teeth. She denies fever or jaw swelling now. She has HA.   Social History  Substance Use Topics  . Smoking status: Current Every Day Smoker    Packs/day: 1.00    Years: 40.00  . Smokeless tobacco: Not on file  . Alcohol use No    Outpatient Medications Prior to Visit  Medication Sig Dispense Refill    . acetaminophen-codeine (TYLENOL #3) 300-30 MG tablet TAKE ONE TABLET BY MOUTH EVERY 6 HOURS AS NEEDED FOR MODERATE PAIN 120 tablet 1  . albuterol (PROVENTIL HFA;VENTOLIN HFA) 108 (90 Base) MCG/ACT inhaler Inhale 2 puffs into the lungs every 6 (six) hours as needed for wheezing or shortness of breath. 1 Inhaler 3  . amitriptyline (ELAVIL) 75 MG tablet Take 1 tablet (75 mg total) by mouth at bedtime. 30 tablet 2  . aspirin 81 MG tablet Take 1 tablet (81 mg total) by mouth daily. 90 tablet 3  . atorvastatin (LIPITOR) 20 MG tablet Take 1 tablet (20 mg total) by mouth daily. 90 tablet 3  . diphenoxylate-atropine (LOMOTIL) 2.5-0.025 MG tablet Take 1 tablet by mouth 4 (four) times daily as needed for diarrhea or loose stools. (Patient not taking: Reported on 10/13/2015) 30 tablet 2  . Fluticasone-Salmeterol (ADVAIR) 100-50 MCG/DOSE AEPB Inhale 1 puff into the lungs 2 (two) times daily. 1 each 3  . hydrALAZINE (APRESOLINE) 10 MG tablet Take 1 tablet (10 mg total) by mouth 4 (four) times daily. 120 tablet 3  . losartan (COZAAR) 50 MG tablet Take 1 tablet (50 mg total) by mouth daily. 90 tablet 3  . methocarbamol (ROBAXIN) 500 MG tablet Take 1 tablet (500 mg total) by mouth 3 (three) times daily. 60 tablet 2  . nitroGLYCERIN (NITROSTAT) 0.4 MG SL tablet Place 1 tablet (0.4 mg total) under the tongue every 5 (five) minutes as needed for chest pain. If you need more than 2 doses call 911 (Patient not taking: Reported on 10/13/2015) 20 tablet 1  .  predniSONE (DELTASONE) 20 MG tablet Take every morning with food 60 mg daily for 3 days, 40 mg daily for 3 days, 30 mg daily for 3 days, 20 mg daily for 3 days, 10 mg daily for 3 days then STOP 24 tablet 0   No facility-administered medications prior to visit.     ROS Review of Systems  Constitutional: Negative for chills and fever.  HENT: Positive for dental problem.   Eyes: Negative for visual disturbance.  Respiratory: Positive for shortness of breath.    Cardiovascular: Positive for chest pain.  Gastrointestinal: Positive for diarrhea. Negative for abdominal distention, abdominal pain, anal bleeding, blood in stool, constipation, nausea, rectal pain and vomiting.  Musculoskeletal: Positive for back pain and neck pain. Negative for arthralgias.  Skin: Negative for rash.  Allergic/Immunologic: Negative for immunocompromised state.  Neurological: Positive for headaches.  Hematological: Negative for adenopathy. Does not bruise/bleed easily.  Psychiatric/Behavioral: Negative for dysphoric mood and suicidal ideas.    Objective:  BP (!) 144/92 (BP Location: Left Arm, Patient Position: Sitting, Cuff Size: Large)   Pulse 81   Temp 98.3 F (36.8 C) (Oral)   Ht  (1.6 m)   Wt 195 lb 6.4 oz (88.6 kg)   SpO2 96%   BMI 34.61 kg/m   BP/Weight 11/18/2015 10/13/2015 09/09/2015  Systolic BP 144 159 137  Diastolic BP 92 69 80  Wt. (Lbs) 195.4 190 188  BMI 34.61 33.67 32.25   Physical Exam  Constitutional: She is oriented to person, place, and time. She appears well-developed and well-nourished. No distress.  HENT:  Head: Normocephalic and atraumatic.  Mouth/Throat: Oropharynx is clear and moist and mucous membranes are normal. Abnormal dentition. Dental caries present. No dental abscesses.  Cardiovascular: Normal rate, regular rhythm, normal heart sounds and intact distal pulses.   Pulmonary/Chest: Effort normal and breath sounds normal.  Musculoskeletal: She exhibits no edema.  Back Exam: Back: Normal Curvature, no deformities or CVA tenderness  Paraspinal Tenderness: b/l lumbar   LE Strength 5/5  LE Sensation: in tact  LE Reflexes 2+ and symmetric  Straight leg raise: neg    Neurological: She is alert and oriented to person, place, and time.  Skin: Skin is warm and dry. No rash noted.  Psychiatric: She has a normal mood and affect.     Assessment & Plan:   There are no diagnoses linked to this encounter. Mansi was seen today  for back pain, neck pain and chest pain.  Diagnoses and all orders for this visit:  Healthcare maintenance -     Flu Vaccine QUAD 36+ mos IM  Chronic back pain -     diazepam (VALIUM) 5 MG tablet; Take 1 tablet (5 mg total) by mouth every 12 (twelve) hours as needed for anxiety. -     naproxen (NAPROSYN) 500 MG tablet; Take 1 tablet (500 mg total) by mouth 2 (two) times daily with a meal.  Chronic neck pain -     diazepam (VALIUM) 5 MG tablet; Take 1 tablet (5 mg total) by mouth every 12 (twelve) hours as needed for anxiety. -     naproxen (NAPROSYN) 500 MG tablet; Take 1 tablet (500 mg total) by mouth 2 (two) times daily with a meal.  Essential hypertension, benign -     losartan (COZAAR) 100 MG tablet; Take 1 tablet (100 mg total) by mouth daily.  Pain due to dental caries -     Ambulatory referral to Dentistry  Gastroesophageal reflux disease, esophagitis presence  not specified -     ranitidine (ZANTAC) 300 MG tablet; Take 1 tablet (300 mg total) by mouth at bedtime.   No orders of the defined types were placed in this encounter.   Follow-up: No Follow-up on file.   Boykin Nearing MD

## 2015-11-18 NOTE — Patient Instructions (Addendum)
Charlene Allen was seen today for back pain, neck pain and chest pain.  Diagnoses and all orders for this visit:  Chronic back pain -     diazepam (VALIUM) 5 MG tablet; Take 1 tablet (5 mg total) by mouth every 12 (twelve) hours as needed for anxiety. -     naproxen (NAPROSYN) 500 MG tablet; Take 1 tablet (500 mg total) by mouth 2 (two) times daily with a meal.  Chronic neck pain -     diazepam (VALIUM) 5 MG tablet; Take 1 tablet (5 mg total) by mouth every 12 (twelve) hours as needed for anxiety. -     naproxen (NAPROSYN) 500 MG tablet; Take 1 tablet (500 mg total) by mouth 2 (two) times daily with a meal.  Essential hypertension, benign -     losartan (COZAAR) 100 MG tablet; Take 1 tablet (100 mg total) by mouth daily.  Pain due to dental caries -     Ambulatory referral to Dentistry  start valium to help relax muscles and nerves. Work to quit smoking   Smoking cessation support: smoking cessation hotline: 1-800-QUIT-NOW.  Smoking cessation classes are available through [facility]. Call 936-161-1994 or visit our website at [email or web address]   You will get the tylenol #3 refilled today  F/u in 4 weeks for chronic back and neck pain   Dr. Adrian Blackwater

## 2015-11-18 NOTE — Assessment & Plan Note (Signed)
Chest pain at rest  Start zantac Patient has cardiology appt next week

## 2015-11-18 NOTE — Progress Notes (Signed)
C/C: patient states that her chest pains have stopped.

## 2015-11-24 ENCOUNTER — Ambulatory Visit: Payer: Self-pay | Admitting: Cardiovascular Disease

## 2015-12-03 ENCOUNTER — Encounter: Payer: Self-pay | Admitting: Cardiovascular Disease

## 2015-12-10 ENCOUNTER — Telehealth: Payer: Self-pay | Admitting: Family Medicine

## 2015-12-10 ENCOUNTER — Encounter (HOSPITAL_COMMUNITY): Payer: Self-pay

## 2015-12-10 ENCOUNTER — Emergency Department (HOSPITAL_COMMUNITY)
Admission: EM | Admit: 2015-12-10 | Discharge: 2015-12-10 | Disposition: A | Discharge status: Home / Self Care | Payer: Self-pay | Attending: Dermatology | Admitting: Dermatology

## 2015-12-10 DIAGNOSIS — I10 Essential (primary) hypertension: Secondary | ICD-10-CM | POA: Insufficient documentation

## 2015-12-10 DIAGNOSIS — F17200 Nicotine dependence, unspecified, uncomplicated: Secondary | ICD-10-CM | POA: Insufficient documentation

## 2015-12-10 DIAGNOSIS — G8929 Other chronic pain: Secondary | ICD-10-CM

## 2015-12-10 DIAGNOSIS — M549 Dorsalgia, unspecified: Secondary | ICD-10-CM | POA: Insufficient documentation

## 2015-12-10 DIAGNOSIS — Z5321 Procedure and treatment not carried out due to patient leaving prior to being seen by health care provider: Secondary | ICD-10-CM | POA: Insufficient documentation

## 2015-12-10 DIAGNOSIS — Z7982 Long term (current) use of aspirin: Secondary | ICD-10-CM | POA: Insufficient documentation

## 2015-12-10 DIAGNOSIS — M542 Cervicalgia: Secondary | ICD-10-CM

## 2015-12-10 NOTE — ED Notes (Signed)
Pt ambulated out of ER, did not wish to speak to the nurse

## 2015-12-10 NOTE — ED Triage Notes (Signed)
Pt reports chronic back pain with hx of arthritis. She reports pain radiates bilaterally down the legs. Pt reports difficulty ambulating.

## 2015-12-10 NOTE — Telephone Encounter (Signed)
Patient called the office to speak with nurse regarding the current medication she is taking, tylenol #3 and muscle relaxer. Pt stated that pain has gotten worse and meds aren't helping. Pt wants to know if PCP can call a stronger pain medicine to [facility] on [address]. I informed pt that we don't carry anything stronger but either way I would inform PCP. Please follow up.  Thank you.

## 2015-12-11 ENCOUNTER — Encounter (HOSPITAL_COMMUNITY): Payer: Self-pay | Admitting: Emergency Medicine

## 2015-12-11 ENCOUNTER — Emergency Department (HOSPITAL_COMMUNITY)
Admission: EM | Admit: 2015-12-11 | Discharge: 2015-12-11 | Disposition: A | Discharge status: Home / Self Care | Payer: Self-pay | Attending: Emergency Medicine | Admitting: Emergency Medicine

## 2015-12-11 DIAGNOSIS — Z7982 Long term (current) use of aspirin: Secondary | ICD-10-CM | POA: Insufficient documentation

## 2015-12-11 DIAGNOSIS — M549 Dorsalgia, unspecified: Secondary | ICD-10-CM

## 2015-12-11 DIAGNOSIS — G8929 Other chronic pain: Secondary | ICD-10-CM | POA: Insufficient documentation

## 2015-12-11 DIAGNOSIS — I10 Essential (primary) hypertension: Secondary | ICD-10-CM | POA: Insufficient documentation

## 2015-12-11 DIAGNOSIS — M545 Low back pain: Secondary | ICD-10-CM | POA: Insufficient documentation

## 2015-12-11 DIAGNOSIS — F17200 Nicotine dependence, unspecified, uncomplicated: Secondary | ICD-10-CM | POA: Insufficient documentation

## 2015-12-11 MED ORDER — KETOROLAC TROMETHAMINE 60 MG/2ML IM SOLN
60.0000 mg | Freq: Once | INTRAMUSCULAR | Status: AC
  Administered 2015-12-11: 60 mg via INTRAMUSCULAR
  Filled 2015-12-11: qty 2

## 2015-12-11 MED ORDER — METHOCARBAMOL 500 MG PO TABS
1000.0000 mg | ORAL_TABLET | Freq: Once | ORAL | Status: AC
  Administered 2015-12-11: 1000 mg via ORAL
  Filled 2015-12-11: qty 2

## 2015-12-11 MED ORDER — OXYCODONE-ACETAMINOPHEN 5-325 MG PO TABS
2.0000 | ORAL_TABLET | Freq: Once | ORAL | Status: AC
  Administered 2015-12-11: 2 via ORAL
  Filled 2015-12-11: qty 2

## 2015-12-11 MED ORDER — METHOCARBAMOL 500 MG PO TABS: 500.0000 mg | ORAL_TABLET | Freq: Two times a day (BID) | ORAL | 0 refills | Status: DC

## 2015-12-11 NOTE — ED Triage Notes (Signed)
Patient states chronic pain with history of arthiritis.  Patient states chronic pain down legs also.  Patient states called her primary doctors office for something stronger than Tylenol 3 and muscle relaxant and they wouldn't give anything stronger.

## 2015-12-11 NOTE — Discharge Instructions (Signed)
Take it easy, but do not lay around too much as this may make the stiffness worse. Take 500 mg of naproxen every 12 hours or 800 mg of ibuprofen every 8 hours for the next 3 days. Take these medications with food to avoid upset stomach. Robaxin is a muscle relaxer and may help loosen stiff muscles. Do not take the Robaxin while driving or performing other dangerous activities.

## 2015-12-11 NOTE — ED Provider Notes (Signed)
[facility] DEPT Provider Note   CSN: [id number] Arrival date & time: 12/11/15  [id number]  By signing my name below, I, Charlene Allen, attest that this documentation has been prepared under the direction and in the presence of Shawn C. Joy, PA-C. Electronically Signed: Rayna Allen, ED Scribe. 12/11/15. 9:46 AM.   History   Chief Complaint Chief Complaint  Patient presents with  . Back Pain    HPI HPI Comments: Charlene Allen is a 60 y.o. female with a h/o obesity and chronic back pain who presents to the Emergency Department complaining of chronic, moderate, lower back pain which worsened beginning 3 days ago. She states she has attempted to contact her PCP yesterday morning at [place] but she is out of the office. Her chronic pain radiates down her BLEs. She is rx tylenol #3 and a muscle relaxant for her chronic pain with her last dose last night. Pt states she is not getting any relief with tylenol #3 and requests an alternative "stronger" medication. Pt denies seeing a pain specialist or having a pain management contract. Pt denies a h/o kidney issues or any known drug allergies. No other associated symptoms at this time.    The history is provided by the patient. No language interpreter was used.    Past Medical History:  Diagnosis Date  . Hypertension Dx 2015  . Obesity   . PMB (postmenopausal bleeding)     Patient Active Problem List   Diagnosis Date Noted  . HPV test positive 10/13/2015  . Chest pain 09/09/2015  . Frontal headache 09/09/2015  . Mild diastolic dysfunction [id number]  . Chronic diarrhea 08/13/2015  . Tobacco use disorder 08/13/2015  . Cough 08/13/2015  . SOB (shortness of breath) 08/13/2015  . Chronic back pain 03/15/2013  . Chronic neck pain 03/15/2013  . Essential hypertension, benign 03/15/2013  . History of substance abuse 03/15/2013    Past Surgical History:  Procedure Laterality Date  . HAND SURGERY    . TUBAL LIGATION       OB History    Gravida Para Term Preterm AB Living   4 3 3   1 3    SAB TAB Ectopic Multiple Live Births     1             Home Medications    Prior to Admission medications   Medication Sig Start Date End Date Taking? Authorizing Provider  acetaminophen-codeine (TYLENOL #3) 300-30 MG tablet TAKE ONE TABLET BY MOUTH EVERY 6 HOURS AS NEEDED FOR MODERATE PAIN 11/13/15   Boykin Nearing, MD  albuterol (PROVENTIL HFA;VENTOLIN HFA) 108 (90 Base) MCG/ACT inhaler Inhale 2 puffs into the lungs every 6 (six) hours as needed for wheezing or shortness of breath. 08/12/15   Josalyn Funches, MD  amitriptyline (ELAVIL) 75 MG tablet Take 1 tablet (75 mg total) by mouth at bedtime. 10/13/15   Boykin Nearing, MD  aspirin 81 MG tablet Take 1 tablet (81 mg total) by mouth daily. 09/09/15   Josalyn Funches, MD  atorvastatin (LIPITOR) 20 MG tablet Take 1 tablet (20 mg total) by mouth daily. 08/13/15   Josalyn Funches, MD  diazepam (VALIUM) 5 MG tablet Take 1 tablet (5 mg total) by mouth every 12 (twelve) hours as needed for anxiety. 11/18/15   Josalyn Funches, MD  diphenoxylate-atropine (LOMOTIL) 2.5-0.025 MG tablet Take 1 tablet by mouth 4 (four) times daily as needed for diarrhea or loose stools. Patient not taking: Reported on 10/13/2015 09/09/15   Josalyn Funches,  MD  Fluticasone-Salmeterol (ADVAIR) 100-50 MCG/DOSE AEPB Inhale 1 puff into the lungs 2 (two) times daily. 08/12/15   Josalyn Funches, MD  hydrALAZINE (APRESOLINE) 10 MG tablet Take 1 tablet (10 mg total) by mouth 4 (four) times daily. 08/12/15   Josalyn Funches, MD  losartan (COZAAR) 100 MG tablet Take 1 tablet (100 mg total) by mouth daily. 11/18/15   Josalyn Funches, MD  methocarbamol (ROBAXIN) 500 MG tablet Take 1 tablet (500 mg total) by mouth 2 (two) times daily. 12/11/15   Shawn C Joy, PA-C  naproxen (NAPROSYN) 500 MG tablet Take 1 tablet (500 mg total) by mouth 2 (two) times daily with a meal. 11/18/15   Josalyn Funches, MD  nitroGLYCERIN  (NITROSTAT) 0.4 MG SL tablet Place 1 tablet (0.4 mg total) under the tongue every 5 (five) minutes as needed for chest pain. If you need more than 2 doses call 911 Patient not taking: Reported on 10/13/2015 09/09/15   Boykin Nearing, MD  ranitidine (ZANTAC) 300 MG tablet Take 1 tablet (300 mg total) by mouth at bedtime. 11/18/15   Boykin Nearing, MD    Family History Family History  Problem Relation Age of Onset  . COPD Mother   . Stroke Mother   . Heart disease Mother   . Hypertension Mother   . Cancer Sister 25    cervical cancer   . Stroke Maternal Grandmother     Social History Social History  Substance Use Topics  . Smoking status: Current Every Day Smoker    Packs/day: 1.00    Years: 40.00  . Smokeless tobacco: Never Used  . Alcohol use No     Allergies   Review of patient's allergies indicates no known allergies.   Review of Systems Review of Systems  Musculoskeletal: Positive for arthralgias, back pain and myalgias.  Skin: Negative for color change and wound.  Neurological: Negative for weakness and numbness.   Physical Exam Updated Vital Signs BP 129/69 (BP Location: Right Arm)   Pulse 76   Temp 98.2 F (36.8 C) (Oral)   Resp 22   SpO2 97%   Physical Exam  Constitutional: She appears well-developed and well-nourished. No distress.  HENT:  Head: Normocephalic and atraumatic.  Eyes: Conjunctivae are normal.  Neck: Neck supple.  Cardiovascular: Normal rate and regular rhythm.   Pulmonary/Chest: Effort normal.  Musculoskeletal: She exhibits tenderness.  TTP to the bilateral lumbar musculature. Full ROM in all extremities and spine. No midline spinal tenderness.   Neurological: She is alert.  No sensory deficits. Strength 5/5 in all extremities. No gait disturbance. Coordination intact.   Skin: Skin is warm and dry. She is not diaphoretic.  Psychiatric: She has a normal mood and affect. Her behavior is normal.  Nursing note and vitals reviewed.  ED  Treatments / Results  Labs (all labs ordered are listed, but only abnormal results are displayed) Labs Reviewed - No data to display  EKG  EKG Interpretation None       Radiology No results found.  Procedures Procedures  DIAGNOSTIC STUDIES: Oxygen Saturation is 97% on RA, normal by my interpretation.    COORDINATION OF CARE: 9:45 AM Discussed next steps with pt. Pt verbalized understanding and is agreeable with the plan.    Medications Ordered in ED Medications  oxyCODONE-acetaminophen (PERCOCET/ROXICET) 5-325 MG per tablet 2 tablet (2 tablets Oral Given 12/11/15 0955)  ketorolac (TORADOL) injection 60 mg (60 mg Intramuscular Given 12/11/15 0955)  methocarbamol (ROBAXIN) tablet 1,000 mg (1,000 mg Oral Given  12/11/15 0955)     Initial Impression / Assessment and Plan / ED Course  I have reviewed the triage vital signs and the nursing notes.  Pertinent labs & imaging results that were available during my care of the patient were reviewed by me and considered in my medical decision making (see chart for details).  Clinical Course    Patient with chronic back pain.  No neurological deficits and normal neuro exam.  Patient is ambulatory.  No loss of bowel or bladder control.  No concern for cauda equina.  No fever, night sweats, weight loss, h/o cancer, IVDA, no recent procedure to back. No urinary symptoms suggestive of UTI.  Supportive care and return precaution discussed. Appears safe for discharge at this time. Follow up with PCP on this matter.     I personally performed the services described in this documentation, which was scribed in my presence. The recorded information has been reviewed and is accurate.   Final Clinical Impressions(s) / ED Diagnoses   Final diagnoses:  Chronic back pain    New Prescriptions New Prescriptions   METHOCARBAMOL (ROBAXIN) 500 MG TABLET    Take 1 tablet (500 mg total) by mouth 2 (two) times daily.     Lorayne Bender, PA-C 12/11/15  1008  It was reported to me that the RN who was caring for the patient, Edyth Gunnels, sustained an accidental needle puncture wound following the administration of the patient's IM injection. Amy has a puncture wound to the left middle finger noted on exam. Exposure workup was completed on the patient on behalf of the nurse.    Lorayne Bender, PA-C 12/11/15 Ashton Liu, MD 12/11/15 2036

## 2015-12-11 NOTE — Telephone Encounter (Signed)
Will route to PCP 

## 2015-12-12 MED ORDER — TRAMADOL HCL 50 MG PO TABS: 50.0000 mg | ORAL_TABLET | Freq: Two times a day (BID) | ORAL | 0 refills | Status: DC | PRN

## 2015-12-12 MED FILL — traMADol HCL 50 MG TABS: 50 | 30 days supply | Qty: 60 | Fill #0

## 2015-12-12 NOTE — Telephone Encounter (Signed)
Script is up front in book for pt to pick up

## 2015-12-12 NOTE — Telephone Encounter (Signed)
Patient advised to combine tylenol #3 4 times daily with twice daily tramadol to help reduce pain  Rx ready for pick up

## 2015-12-12 NOTE — Telephone Encounter (Signed)
Pt. Called requesting a higher dosage of Tylenol # 3. Please f/u with pt.

## 2015-12-15 MED FILL — hydrALAZINE HCL 10 MG TABS: 10 | 30 days supply | Qty: 120 | Fill #2

## 2015-12-15 MED FILL — ADVAIR 100/50 DISKUS: 100-50 | 30 days supply | Qty: 60 | Fill #2

## 2015-12-15 MED FILL — VENTOLIN HFA 90 MCG INHALER: 108 (90 BAS | 30 days supply | Qty: 18 | Fill #1

## 2015-12-19 ENCOUNTER — Encounter: Payer: Self-pay | Admitting: Family Medicine

## 2015-12-19 ENCOUNTER — Ambulatory Visit: Payer: Self-pay | Attending: Family Medicine | Admitting: Family Medicine

## 2015-12-19 VITALS — BP 159/87 | HR 78 | Temp 98.7°F | Ht 63.0 in | Wt 201.0 lb

## 2015-12-19 DIAGNOSIS — M549 Dorsalgia, unspecified: Secondary | ICD-10-CM

## 2015-12-19 DIAGNOSIS — F17200 Nicotine dependence, unspecified, uncomplicated: Secondary | ICD-10-CM

## 2015-12-19 DIAGNOSIS — R05 Cough: Secondary | ICD-10-CM

## 2015-12-19 DIAGNOSIS — M542 Cervicalgia: Secondary | ICD-10-CM

## 2015-12-19 DIAGNOSIS — M545 Low back pain: Secondary | ICD-10-CM | POA: Insufficient documentation

## 2015-12-19 DIAGNOSIS — I10 Essential (primary) hypertension: Secondary | ICD-10-CM | POA: Insufficient documentation

## 2015-12-19 DIAGNOSIS — R059 Cough, unspecified: Secondary | ICD-10-CM

## 2015-12-19 DIAGNOSIS — Z7982 Long term (current) use of aspirin: Secondary | ICD-10-CM | POA: Insufficient documentation

## 2015-12-19 DIAGNOSIS — G8929 Other chronic pain: Secondary | ICD-10-CM

## 2015-12-19 DIAGNOSIS — F17210 Nicotine dependence, cigarettes, uncomplicated: Secondary | ICD-10-CM | POA: Insufficient documentation

## 2015-12-19 DIAGNOSIS — R0602 Shortness of breath: Secondary | ICD-10-CM

## 2015-12-19 MED ORDER — ALBUTEROL SULFATE HFA 108 (90 BASE) MCG/ACT IN AERS: 2.0000 | INHALATION_SPRAY | Freq: Four times a day (QID) | RESPIRATORY_TRACT | 3 refills | Status: DC | PRN

## 2015-12-19 MED ORDER — ACETAMINOPHEN-CODEINE #4 300-60 MG PO TABS: 1.0000 | ORAL_TABLET | Freq: Four times a day (QID) | ORAL | 0 refills | Status: DC | PRN

## 2015-12-19 MED ORDER — ALBUTEROL SULFATE (2.5 MG/3ML) 0.083% IN NEBU
2.5000 mg | INHALATION_SOLUTION | Freq: Once | RESPIRATORY_TRACT | Status: AC
  Administered 2015-12-19: 2.5 mg via RESPIRATORY_TRACT

## 2015-12-19 MED ORDER — BENZONATATE 100 MG PO CAPS: 100.0000 mg | ORAL_CAPSULE | Freq: Two times a day (BID) | ORAL | 0 refills | Status: DC | PRN

## 2015-12-19 MED FILL — BENZONATATE 100 MG CAPSULE: 100 | 10 days supply | Qty: 20 | Fill #0

## 2015-12-19 MED FILL — ACETAMINOPHEN/COD #4 TABLET: 300-60 | 30 days supply | Qty: 120 | Fill #0

## 2015-12-19 MED FILL — LOSARTAN POTASSIUM 100 MG T: 100 | 30 days supply | Qty: 30 | Fill #0

## 2015-12-19 MED FILL — ATORVASTATIN 20 MG TABLET: 20 | 30 days supply | Qty: 30 | Fill #3

## 2015-12-19 NOTE — Progress Notes (Signed)
C/C: back and neck pain 7 pain level Pt wants to go back to tylenol #3 Tramadol makes her sick.

## 2015-12-19 NOTE — Assessment & Plan Note (Signed)
A: cough x 3 days in smoker  P: Albuterol neb treatment Refill albuterol inhaler Tessalon perles for cough mucinex for congestion Call in 7 days if no better for course of steroids and antibiotics

## 2015-12-19 NOTE — Patient Instructions (Addendum)
Charlene Allen was seen today for back pain and neck pain.  Diagnoses and all orders for this visit:  Chronic back pain -     acetaminophen-codeine (TYLENOL #4) 300-60 MG tablet; Take 1 tablet by mouth every 6 (six) hours as needed for moderate pain. -     Ambulatory referral to Pain Clinic  Chronic neck pain -     acetaminophen-codeine (TYLENOL #4) 300-60 MG tablet; Take 1 tablet by mouth every 6 (six) hours as needed for moderate pain. -     Ambulatory referral to Pain Clinic  Tobacco use disorder -     albuterol (PROVENTIL HFA;VENTOLIN HFA) 108 (90 Base) MCG/ACT inhaler; Inhale 2 puffs into the lungs every 6 (six) hours as needed for wheezing or shortness of breath.  Cough -     albuterol (PROVENTIL HFA;VENTOLIN HFA) 108 (90 Base) MCG/ACT inhaler; Inhale 2 puffs into the lungs every 6 (six) hours as needed for wheezing or shortness of breath. -     benzonatate (TESSALON) 100 MG capsule; Take 1 capsule (100 mg total) by mouth 2 (two) times daily as needed for cough.  restart albuterol every 6 hrs for next 2 days then as needed  mucinex  Will help break up congestion Call if no better in 7 days for antibiotics  F/u in 1 month for chronic back and neck pain   Dr. Adrian Blackwater

## 2015-12-19 NOTE — Assessment & Plan Note (Signed)
A: chronic pain in back and neck. Pain is uncontrolled. No red flags P: Change from tylenol #3 to tylenol #4

## 2015-12-19 NOTE — Progress Notes (Signed)
Subjective:  Patient ID: Charlene Allen, female    DOB: 05-Jul-1955  Age: 60 y.o. MRN: [id number]  CC: Back Pain and Neck Pain   HPI Isis Zisk has HTN, diastolic dysfunction, smoker she presents for   1. Chronic back pain: chronic cervical and lumbar pain. Worsening. 8/10. No weakness in legs. No fecal or urinary incontinence. She is taking Tylenol #3 BID and elavil are helping with pain, but not fully controlling pain symptoms.She presented to the ED on  12/11/15 for back pain she was treated with percocet, robaxin and Toradol shot in ED and discharged with an Rx for robaxin.  She is a smoker. She denies dysuria, hematuria, frequency and urgency. She refuses tramadol as she has significant nausea with tramadol.   03/27/14 lumbar MRI IMPRESSION: No advanced stenosis or neural compression. Mild narrowing of the lateral recesses and foramina at L4-5 without gross neural compression.  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  11/15/2007 cervical MRI IMPRESSION: 1. 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 2. C5-C6: Central broad-based shallow disc protrusion with bilateral uncovertebral hypertrophy, right greater than left. 3. C3-C4: Central broad based disc bulge.  2. Cough: x 3 days. No SOB or fever. Feeling fatigue. Cough with incontinence. Cough with congestion. No known sick contacts.   Social History  Substance Use Topics  . Smoking status: Current Every Day Smoker    Packs/day: 1.00    Years: 40.00  . Smokeless tobacco: Never Used  . Alcohol use No    Outpatient Medications Prior to Visit  Medication Sig Dispense Refill  . acetaminophen-codeine (TYLENOL #3) 300-30 MG tablet TAKE ONE TABLET BY MOUTH EVERY 6 HOURS AS NEEDED FOR MODERATE PAIN 120 tablet 1  .  albuterol (PROVENTIL HFA;VENTOLIN HFA) 108 (90 Base) MCG/ACT inhaler Inhale 2 puffs into the lungs every 6 (six) hours as needed for wheezing or shortness of breath. 1 Inhaler 3  . amitriptyline (ELAVIL) 75 MG tablet Take 1 tablet (75 mg total) by mouth at bedtime. 30 tablet 2  . aspirin 81 MG tablet Take 1 tablet (81 mg total) by mouth daily. 90 tablet 3  . atorvastatin (LIPITOR) 20 MG tablet Take 1 tablet (20 mg total) by mouth daily. 90 tablet 3  . diazepam (VALIUM) 5 MG tablet Take 1 tablet (5 mg total) by mouth every 12 (twelve) hours as needed for anxiety. 60 tablet 0  . diphenoxylate-atropine (LOMOTIL) 2.5-0.025 MG tablet Take 1 tablet by mouth 4 (four) times daily as needed for diarrhea or loose stools. (Patient not taking: Reported on 10/13/2015) 30 tablet 2  . Fluticasone-Salmeterol (ADVAIR) 100-50 MCG/DOSE AEPB Inhale 1 puff into the lungs 2 (two) times daily. 1 each 3  . hydrALAZINE (APRESOLINE) 10 MG tablet Take 1 tablet (10 mg total) by mouth 4 (four) times daily. 120 tablet 3  . losartan (COZAAR) 100 MG tablet Take 1 tablet (100 mg total) by mouth daily. 90 tablet 3  . methocarbamol (ROBAXIN) 500 MG tablet Take 1 tablet (500 mg total) by mouth 2 (two) times daily. 20 tablet 0  . naproxen (NAPROSYN) 500 MG tablet Take 1 tablet (500 mg total) by mouth 2 (two) times daily with a meal. 60 tablet 2  . nitroGLYCERIN (NITROSTAT) 0.4 MG SL tablet Place 1 tablet (0.4 mg total) under the tongue every 5 (five) minutes as needed for  chest pain. If you need more than 2 doses call 911 (Patient not taking: Reported on 10/13/2015) 20 tablet 1  . ranitidine (ZANTAC) 300 MG tablet Take 1 tablet (300 mg total) by mouth at bedtime. 30 tablet 5  . traMADol (ULTRAM) 50 MG tablet Take 1 tablet (50 mg total) by mouth every 12 (twelve) hours as needed. 60 tablet 0   No facility-administered medications prior to visit.     ROS Review of Systems  Constitutional: Negative for chills and fever.  HENT: Negative  for dental problem.   Eyes: Negative for visual disturbance.  Respiratory: Positive for cough and shortness of breath.   Cardiovascular: Negative for chest pain.  Gastrointestinal: Negative for abdominal distention, abdominal pain, anal bleeding, blood in stool, constipation, diarrhea, nausea, rectal pain and vomiting.  Musculoskeletal: Positive for back pain and neck pain. Negative for arthralgias.  Skin: Negative for rash.  Allergic/Immunologic: Negative for immunocompromised state.  Neurological: Positive for headaches.  Hematological: Negative for adenopathy. Does not bruise/bleed easily.  Psychiatric/Behavioral: Negative for dysphoric mood and suicidal ideas.    Objective:  BP (!) 159/87 (BP Location: Left Arm, Patient Position: Sitting, Cuff Size: Small)   Pulse 78   Temp 98.7 F (37.1 C) (Oral)   Ht 5\' 3"  (1.6 m)   Wt 201 lb (91.2 kg)   SpO2 92%   BMI 35.61 kg/m   BP/Weight 12/19/2015 12/11/2015 [id number]  Systolic BP Q000111Q Q000111Q [id number]  Diastolic BP 87 69 99  Wt. (Lbs) 201 - 196  BMI 35.61 - 34.72   Physical Exam  Constitutional: She is oriented to person, place, and time. She appears well-developed and well-nourished. No distress.  HENT:  Head: Normocephalic and atraumatic.  Mouth/Throat: Oropharynx is clear and moist and mucous membranes are normal. Abnormal dentition. Dental caries present. No dental abscesses.  Cardiovascular: Normal rate, regular rhythm, normal heart sounds and intact distal pulses.   Pulmonary/Chest: Effort normal. She has rhonchi.  Musculoskeletal: She exhibits no edema.     Neurological: She is alert and oriented to person, place, and time.  Skin: Skin is warm and dry. No rash noted.  Psychiatric: She has a normal mood and affect.     Assessment & Plan:  Tokiko was seen today for back pain and neck pain.  Diagnoses and all orders for this visit:  Chronic back pain -     acetaminophen-codeine (TYLENOL #4) 300-60 MG tablet; Take 1 tablet by  mouth every 6 (six) hours as needed for moderate pain. -     Ambulatory referral to Pain Clinic  Chronic neck pain -     acetaminophen-codeine (TYLENOL #4) 300-60 MG tablet; Take 1 tablet by mouth every 6 (six) hours as needed for moderate pain. -     Ambulatory referral to Pain Clinic  Tobacco use disorder -     albuterol (PROVENTIL HFA;VENTOLIN HFA) 108 (90 Base) MCG/ACT inhaler; Inhale 2 puffs into the lungs every 6 (six) hours as needed for wheezing or shortness of breath.  Cough -     albuterol (PROVENTIL HFA;VENTOLIN HFA) 108 (90 Base) MCG/ACT inhaler; Inhale 2 puffs into the lungs every 6 (six) hours as needed for wheezing or shortness of breath. -     benzonatate (TESSALON) 100 MG capsule; Take 1 capsule (100 mg total) by mouth 2 (two) times daily as needed for cough.   There are no diagnoses linked to this encounter. There are no diagnoses linked to this encounter. No orders of the defined types were  placed in this encounter.   Follow-up: Return in about 4 weeks (around 01/16/2016) for chornic back and neck pain .   Boykin Nearing MD

## 2015-12-24 ENCOUNTER — Ambulatory Visit: Payer: Self-pay

## 2015-12-25 ENCOUNTER — Other Ambulatory Visit: Payer: Self-pay | Admitting: Family Medicine

## 2015-12-25 DIAGNOSIS — M542 Cervicalgia: Secondary | ICD-10-CM

## 2015-12-25 DIAGNOSIS — M549 Dorsalgia, unspecified: Principal | ICD-10-CM

## 2015-12-25 DIAGNOSIS — G8929 Other chronic pain: Secondary | ICD-10-CM

## 2015-12-25 MED ORDER — DIAZEPAM 5 MG PO TABS: 5.0000 mg | ORAL_TABLET | Freq: Two times a day (BID) | ORAL | 2 refills | Status: DC | PRN

## 2015-12-25 MED FILL — diazePAM 5 MG TABS: 5 | 30 days supply | Qty: 60 | Fill #0

## 2015-12-25 NOTE — Telephone Encounter (Signed)
Called in valium refill to [facility]

## 2016-01-19 ENCOUNTER — Ambulatory Visit: Payer: Self-pay | Attending: Family Medicine | Admitting: Family Medicine

## 2016-01-19 ENCOUNTER — Encounter: Payer: Self-pay | Admitting: Family Medicine

## 2016-01-19 VITALS — BP 179/92 | HR 73 | Temp 98.2°F | Ht 63.0 in | Wt 200.2 lb

## 2016-01-19 DIAGNOSIS — M545 Low back pain, unspecified: Secondary | ICD-10-CM

## 2016-01-19 DIAGNOSIS — M5442 Lumbago with sciatica, left side: Secondary | ICD-10-CM | POA: Insufficient documentation

## 2016-01-19 DIAGNOSIS — M5441 Lumbago with sciatica, right side: Secondary | ICD-10-CM | POA: Insufficient documentation

## 2016-01-19 DIAGNOSIS — M50222 Other cervical disc displacement at C5-C6 level: Secondary | ICD-10-CM | POA: Insufficient documentation

## 2016-01-19 DIAGNOSIS — R51 Headache: Secondary | ICD-10-CM | POA: Insufficient documentation

## 2016-01-19 DIAGNOSIS — F17210 Nicotine dependence, cigarettes, uncomplicated: Secondary | ICD-10-CM | POA: Insufficient documentation

## 2016-01-19 DIAGNOSIS — I10 Essential (primary) hypertension: Secondary | ICD-10-CM | POA: Insufficient documentation

## 2016-01-19 DIAGNOSIS — M542 Cervicalgia: Secondary | ICD-10-CM

## 2016-01-19 DIAGNOSIS — R519 Headache, unspecified: Secondary | ICD-10-CM

## 2016-01-19 DIAGNOSIS — Z1231 Encounter for screening mammogram for malignant neoplasm of breast: Secondary | ICD-10-CM

## 2016-01-19 DIAGNOSIS — G8929 Other chronic pain: Secondary | ICD-10-CM | POA: Insufficient documentation

## 2016-01-19 MED ORDER — IBUPROFEN 600 MG PO TABS: 600.0000 mg | ORAL_TABLET | Freq: Three times a day (TID) | ORAL | 2 refills | Status: DC | PRN

## 2016-01-19 MED ORDER — ACETAMINOPHEN-CODEINE #4 300-60 MG PO TABS: 1.0000 | ORAL_TABLET | Freq: Four times a day (QID) | ORAL | 0 refills | Status: DC | PRN

## 2016-01-19 MED ORDER — AMITRIPTYLINE HCL 50 MG PO TABS: 50.0000 mg | ORAL_TABLET | Freq: Every day | ORAL | 5 refills | Status: DC

## 2016-01-19 MED ORDER — ACETAMINOPHEN-CODEINE #4 300-60 MG PO TABS: 1.0000 | ORAL_TABLET | Freq: Four times a day (QID) | ORAL | 2 refills | Status: DC | PRN

## 2016-01-19 MED ORDER — HYDRALAZINE HCL 25 MG PO TABS: 25.0000 mg | ORAL_TABLET | Freq: Four times a day (QID) | ORAL | 3 refills | Status: DC

## 2016-01-19 MED FILL — ACETAMINOPHEN/COD #4 TABLET: 300-60 | 30 days supply | Qty: 120 | Fill #0

## 2016-01-19 NOTE — Assessment & Plan Note (Signed)
A: elevated BP Med: compliant P: Increase hydralazine to 25 mg BID Continue losartan 100 mg daily Work on pain control Work on smoking cessation. Patches

## 2016-01-19 NOTE — Assessment & Plan Note (Signed)
A: chronic pain P: MRI pending Continue tylenol #4 Continue elavil. Dose adjusted to 50 mg daily Continue valium 5 mg BID Prn robaxin and ibuprofen

## 2016-01-19 NOTE — Progress Notes (Signed)
Subjective:  Patient ID: Charlene Allen, female    DOB: [birth year]  Age: 60 y.o. MRN: [id number]  CC: Back Pain   HPI Charlene Allen has HTN, diastolic dysfunction, smoker she presents for   1. Chronic back pain: chronic cervical and lumbar pain. Worsening. 8/10. No weakness in legs. No fecal or urinary incontinence. She is taking Tylenol #4 4 times a day and elavil she decreased dose to 37.5 mg nightlyare helping with pain, but not fully controlling pain symptoms. She request vicodin, she has been referred to pain management.  She is a smoker. She denies dysuria, hematuria, frequency and urgency. She refuses tramadol as she has significant nausea with tramadol.   03/27/14 lumbar MRI IMPRESSION: No advanced stenosis or neural compression. Mild narrowing of the lateral recesses and foramina at L4-5 without gross neural compression.  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  11/15/2007 cervical MRI IMPRESSION: 1. 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 2. C5-C6: Central broad-based shallow disc protrusion with bilateral uncovertebral hypertrophy, right greater than left. 3. C3-C4: Central broad based disc bulge.  2. HTN: has slight HA today. Compliant with medication. No CP or SOB. Has intermittent cough. Smoking 1 pPD.  3. Smoking: desires to quit. Has called [place] quit line. Has form for me to sign so she can start patches.   Social History  Substance Use Topics  . Smoking status: Current Every Day Smoker    Packs/day: 1.00    Years: 40.00  . Smokeless tobacco: Never Used  . Alcohol use No    Outpatient Medications Prior to Visit  Medication Sig Dispense Refill  . acetaminophen-codeine (TYLENOL #4) 300-60 MG tablet Take 1 tablet by mouth every 6 (six) hours as needed  for moderate pain. 120 tablet 0  . albuterol (PROVENTIL HFA;VENTOLIN HFA) 108 (90 Base) MCG/ACT inhaler Inhale 2 puffs into the lungs every 6 (six) hours as needed for wheezing or shortness of breath. 1 Inhaler 3  . amitriptyline (ELAVIL) 75 MG tablet Take 1 tablet (75 mg total) by mouth at bedtime. 30 tablet 2  . aspirin 81 MG tablet Take 1 tablet (81 mg total) by mouth daily. 90 tablet 3  . atorvastatin (LIPITOR) 20 MG tablet Take 1 tablet (20 mg total) by mouth daily. 90 tablet 3  . benzonatate (TESSALON) 100 MG capsule Take 1 capsule (100 mg total) by mouth 2 (two) times daily as needed for cough. 20 capsule 0  . diazepam (VALIUM) 5 MG tablet Take 1 tablet (5 mg total) by mouth every 12 (twelve) hours as needed for anxiety. 60 tablet 2  . diphenoxylate-atropine (LOMOTIL) 2.5-0.025 MG tablet Take 1 tablet by mouth 4 (four) times daily as needed for diarrhea or loose stools. (Patient not taking: Reported on 12/19/2015) 30 tablet 2  . Fluticasone-Salmeterol (ADVAIR) 100-50 MCG/DOSE AEPB Inhale 1 puff into the lungs 2 (two) times daily. 1 each 3  . hydrALAZINE (APRESOLINE) 10 MG tablet Take 1 tablet (10 mg total) by mouth 4 (four) times daily. 120 tablet 3  . losartan (COZAAR) 100 MG tablet Take 1 tablet (100 mg total) by mouth daily. 90 tablet 3  . methocarbamol (ROBAXIN) 500 MG tablet Take 1 tablet (500 mg total) by mouth 2 (two) times daily. 20 tablet 0  . naproxen (NAPROSYN) 500 MG tablet Take 1 tablet (500 mg total) by  mouth 2 (two) times daily with a meal. 60 tablet 2  . nitroGLYCERIN (NITROSTAT) 0.4 MG SL tablet Place 1 tablet (0.4 mg total) under the tongue every 5 (five) minutes as needed for chest pain. If you need more than 2 doses call 911 (Patient not taking: Reported on 12/19/2015) 20 tablet 1  . ranitidine (ZANTAC) 300 MG tablet Take 1 tablet (300 mg total) by mouth at bedtime. 30 tablet 5   No facility-administered medications prior to visit.     ROS Review of Systems    Constitutional: Negative for chills and fever.  HENT: Negative for dental problem.   Eyes: Negative for visual disturbance.  Respiratory: Positive for cough. Negative for shortness of breath.   Cardiovascular: Negative for chest pain.  Gastrointestinal: Negative for abdominal distention, abdominal pain, anal bleeding, blood in stool, constipation, diarrhea, nausea, rectal pain and vomiting.  Musculoskeletal: Positive for back pain and neck pain. Negative for arthralgias.  Skin: Negative for rash.  Allergic/Immunologic: Negative for immunocompromised state.  Neurological: Positive for headaches.  Hematological: Negative for adenopathy. Does not bruise/bleed easily.  Psychiatric/Behavioral: Negative for dysphoric mood and suicidal ideas.    Objective:  BP (!) 179/92 (BP Location: Left Arm, Patient Position: Sitting, Cuff Size: Small)   Pulse 73   Temp 98.2 F (36.8 C) (Oral)   Ht 5\' 3"  (1.6 m)   Wt 200 lb 3.2 oz (90.8 kg)   SpO2 96%   BMI 35.46 kg/m   BP/Weight 01/19/2016 12/19/2015 [id number]  Systolic BP 0000000 [id number] [id number]  Diastolic BP 92 87 69  Wt. (Lbs) 200.2 201 -  BMI 35.46 35.61 -   Physical Exam  Constitutional: She is oriented to person, place, and time. She appears well-developed and well-nourished. No distress.  HENT:  Head: Normocephalic and atraumatic.  Mouth/Throat: Oropharynx is clear and moist and mucous membranes are normal. Abnormal dentition. Dental caries present. No dental abscesses.  Cardiovascular: Normal rate, regular rhythm, normal heart sounds and intact distal pulses.   Pulmonary/Chest: Effort normal. She has rhonchi.  Musculoskeletal: She exhibits no edema.     Neurological: She is alert and oriented to person, place, and time.  Skin: Skin is warm and dry. No rash noted.  Psychiatric: She has a normal mood and affect.     Assessment & Plan:  Charlene Allen was seen today for back pain.  Diagnoses and all orders for this visit:  Visit for screening  mammogram -     MM DIGITAL SCREENING BILATERAL; Future  Chronic bilateral low back pain with bilateral sciatica -     Discontinue: acetaminophen-codeine (TYLENOL #4) 300-60 MG tablet; Take 1 tablet by mouth every 6 (six) hours as needed for moderate pain. -     ibuprofen (ADVIL,MOTRIN) 600 MG tablet; Take 1 tablet (600 mg total) by mouth every 8 (eight) hours as needed. -     acetaminophen-codeine (TYLENOL #4) 300-60 MG tablet; Take 1 tablet by mouth every 6 (six) hours as needed for moderate pain.  Chronic neck pain -     Discontinue: acetaminophen-codeine (TYLENOL #4) 300-60 MG tablet; Take 1 tablet by mouth every 6 (six) hours as needed for moderate pain. -     ibuprofen (ADVIL,MOTRIN) 600 MG tablet; Take 1 tablet (600 mg total) by mouth every 8 (eight) hours as needed. -     amitriptyline (ELAVIL) 50 MG tablet; Take 1 tablet (50 mg total) by mouth at bedtime. -     acetaminophen-codeine (TYLENOL #4) 300-60 MG tablet; Take 1 tablet  by mouth every 6 (six) hours as needed for moderate pain.  Chronic bilateral low back pain without sciatica -     ibuprofen (ADVIL,MOTRIN) 600 MG tablet; Take 1 tablet (600 mg total) by mouth every 8 (eight) hours as needed. -     amitriptyline (ELAVIL) 50 MG tablet; Take 1 tablet (50 mg total) by mouth at bedtime.  Frontal headache -     amitriptyline (ELAVIL) 50 MG tablet; Take 1 tablet (50 mg total) by mouth at bedtime.  Essential hypertension, benign -     hydrALAZINE (APRESOLINE) 25 MG tablet; Take 1 tablet (25 mg total) by mouth 4 (four) times daily.   There are no diagnoses linked to this encounter. There are no diagnoses linked to this encounter. No orders of the defined types were placed in this encounter.   Follow-up: Return in about 6 weeks (around 03/01/2016) for pap smear .   Boykin Nearing MD

## 2016-01-19 NOTE — Patient Instructions (Addendum)
Charlene Allen was seen today for back pain.  Diagnoses and all orders for this visit:  Visit for screening mammogram -     MM DIGITAL SCREENING BILATERAL; Future  Chronic bilateral low back pain with bilateral sciatica -     Discontinue: acetaminophen-codeine (TYLENOL #4) 300-60 MG tablet; Take 1 tablet by mouth every 6 (six) hours as needed for moderate pain. -     ibuprofen (ADVIL,MOTRIN) 600 MG tablet; Take 1 tablet (600 mg total) by mouth every 8 (eight) hours as needed. -     acetaminophen-codeine (TYLENOL #4) 300-60 MG tablet; Take 1 tablet by mouth every 6 (six) hours as needed for moderate pain.  Chronic neck pain -     Discontinue: acetaminophen-codeine (TYLENOL #4) 300-60 MG tablet; Take 1 tablet by mouth every 6 (six) hours as needed for moderate pain. -     ibuprofen (ADVIL,MOTRIN) 600 MG tablet; Take 1 tablet (600 mg total) by mouth every 8 (eight) hours as needed. -     amitriptyline (ELAVIL) 50 MG tablet; Take 1 tablet (50 mg total) by mouth at bedtime. -     acetaminophen-codeine (TYLENOL #4) 300-60 MG tablet; Take 1 tablet by mouth every 6 (six) hours as needed for moderate pain.  Chronic bilateral low back pain without sciatica -     ibuprofen (ADVIL,MOTRIN) 600 MG tablet; Take 1 tablet (600 mg total) by mouth every 8 (eight) hours as needed. -     amitriptyline (ELAVIL) 50 MG tablet; Take 1 tablet (50 mg total) by mouth at bedtime.  Frontal headache -     amitriptyline (ELAVIL) 50 MG tablet; Take 1 tablet (50 mg total) by mouth at bedtime.  Essential hypertension, benign -     hydrALAZINE (APRESOLINE) 25 MG tablet; Take 1 tablet (25 mg total) by mouth 4 (four) times daily.   F/u in 6-8 weeks for pap smear   Dr. Adrian Blackwater

## 2016-01-19 NOTE — Progress Notes (Signed)
Pt is here with back and neck pain. Pain level is 8 right now.  Pt needs refill on tylenol #4.

## 2016-02-02 ENCOUNTER — Ambulatory Visit (HOSPITAL_COMMUNITY)
Admission: RE | Admit: 2016-02-02 | Discharge: 2016-02-02 | Disposition: A | Discharge status: Home / Self Care | Payer: Self-pay | Source: Ambulatory Visit | Attending: Family Medicine | Admitting: Family Medicine

## 2016-02-02 DIAGNOSIS — M542 Cervicalgia: Secondary | ICD-10-CM | POA: Insufficient documentation

## 2016-02-02 DIAGNOSIS — M4802 Spinal stenosis, cervical region: Secondary | ICD-10-CM | POA: Insufficient documentation

## 2016-02-02 DIAGNOSIS — G8929 Other chronic pain: Secondary | ICD-10-CM | POA: Insufficient documentation

## 2016-02-02 DIAGNOSIS — M549 Dorsalgia, unspecified: Principal | ICD-10-CM

## 2016-02-02 DIAGNOSIS — M5031 Other cervical disc degeneration,  high cervical region: Secondary | ICD-10-CM | POA: Insufficient documentation

## 2016-02-02 DIAGNOSIS — M4686 Other specified inflammatory spondylopathies, lumbar region: Secondary | ICD-10-CM | POA: Insufficient documentation

## 2016-02-02 DIAGNOSIS — R938 Abnormal findings on diagnostic imaging of other specified body structures: Secondary | ICD-10-CM | POA: Insufficient documentation

## 2016-02-02 DIAGNOSIS — M545 Low back pain: Secondary | ICD-10-CM | POA: Insufficient documentation

## 2016-02-03 ENCOUNTER — Other Ambulatory Visit: Payer: Self-pay | Admitting: Family Medicine

## 2016-02-03 DIAGNOSIS — M542 Cervicalgia: Principal | ICD-10-CM

## 2016-02-03 DIAGNOSIS — G8929 Other chronic pain: Secondary | ICD-10-CM

## 2016-02-03 NOTE — Assessment & Plan Note (Signed)
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

## 2016-02-04 ENCOUNTER — Telehealth: Payer: Self-pay

## 2016-02-04 NOTE — Telephone Encounter (Signed)
Pt was called and informed of X-ray results. 

## 2016-02-06 ENCOUNTER — Institutional Professional Consult (permissible substitution): Payer: Self-pay | Admitting: Emergency Medicine

## 2016-02-09 MED FILL — ATORVASTATIN 20 MG TABLET: 20 | 30 days supply | Qty: 30 | Fill #4

## 2016-02-09 MED FILL — LOSARTAN POTASSIUM 100 MG T: 100 | 30 days supply | Qty: 30 | Fill #1

## 2016-02-16 MED FILL — ACETAMINOPHEN/COD #4 TABLET: 300-60 | 30 days supply | Qty: 120 | Fill #1

## 2016-02-17 ENCOUNTER — Telehealth: Payer: Self-pay | Admitting: Family Medicine

## 2016-02-17 ENCOUNTER — Ambulatory Visit: Payer: Self-pay | Admitting: Cardiovascular Disease

## 2016-02-17 NOTE — Telephone Encounter (Signed)
Called and left pt a message informing her that she has a prescription ready for pick up and to do so by Monday before 3pm.

## 2016-02-23 ENCOUNTER — Ambulatory Visit: Payer: Self-pay | Admitting: Physical Medicine & Rehabilitation

## 2016-02-24 ENCOUNTER — Encounter: Payer: Self-pay | Admitting: Cardiovascular Disease

## 2016-03-12 ENCOUNTER — Institutional Professional Consult (permissible substitution): Payer: Self-pay | Admitting: Emergency Medicine

## 2016-03-15 MED FILL — ACETAMINOPHEN/COD #4 TABLET: 300-60 | 30 days supply | Qty: 120 | Fill #2

## 2016-03-15 MED FILL — diazePAM 5 MG TABS: 5 | 30 days supply | Qty: 60 | Fill #1

## 2016-03-19 ENCOUNTER — Ambulatory Visit: Payer: Self-pay | Admitting: Physical Medicine & Rehabilitation

## 2016-03-24 MED FILL — LOSARTAN POTASSIUM 100 MG T: 100 | 30 days supply | Qty: 30 | Fill #2

## 2016-03-24 MED FILL — hydrALAZINE HCL 10 MG TABS: 10 | 30 days supply | Qty: 120 | Fill #3

## 2016-03-24 MED FILL — ?ATORVASTATIN 20 MG TABLET: 20 | 30 days supply | Qty: 30 | Fill #5

## 2016-03-25 MED FILL — !VENTOLIN HFA INHALER: 108 (90 BAS | 20 days supply | Qty: 18 | Fill #0

## 2016-04-08 ENCOUNTER — Telehealth: Payer: Self-pay

## 2016-04-08 ENCOUNTER — Other Ambulatory Visit: Payer: Self-pay | Admitting: Family Medicine

## 2016-04-08 DIAGNOSIS — G8929 Other chronic pain: Secondary | ICD-10-CM

## 2016-04-08 DIAGNOSIS — M542 Cervicalgia: Secondary | ICD-10-CM

## 2016-04-08 DIAGNOSIS — M5441 Lumbago with sciatica, right side: Principal | ICD-10-CM

## 2016-04-08 DIAGNOSIS — M5442 Lumbago with sciatica, left side: Principal | ICD-10-CM

## 2016-04-08 MED ORDER — ACETAMINOPHEN-CODEINE #4 300-60 MG PO TABS: 1.0000 | ORAL_TABLET | Freq: Four times a day (QID) | ORAL | 2 refills | Status: DC | PRN

## 2016-04-08 NOTE — Telephone Encounter (Signed)
Pt was called and informed of script being ready for pick up.

## 2016-04-08 NOTE — Telephone Encounter (Signed)
Please inform patient tylenol #4 ready for pick up

## 2016-04-12 MED FILL — ACETAMINOPHEN/COD #4 TABLET: 300-60 | 30 days supply | Qty: 120 | Fill #0

## 2016-04-19 ENCOUNTER — Ambulatory Visit: Payer: Self-pay | Admitting: Physical Medicine & Rehabilitation

## 2016-04-22 ENCOUNTER — Ambulatory Visit: Payer: Self-pay | Admitting: Family Medicine

## 2016-04-27 ENCOUNTER — Ambulatory Visit: Payer: Self-pay

## 2016-04-27 ENCOUNTER — Ambulatory Visit: Payer: Self-pay | Admitting: Physical Medicine & Rehabilitation

## 2016-04-30 ENCOUNTER — Ambulatory Visit: Payer: Self-pay | Admitting: Physical Medicine & Rehabilitation

## 2016-05-04 ENCOUNTER — Ambulatory Visit: Payer: Self-pay | Attending: Family Medicine

## 2016-05-04 MED FILL — ATORVASTATIN 20 MG TABLET: 20 | 30 days supply | Qty: 30 | Fill #6

## 2016-05-04 MED FILL — LOSARTAN POTASSIUM 100 MG T: 100 | 30 days supply | Qty: 30 | Fill #3

## 2016-05-10 MED FILL — ACETAMINOPHEN/COD #4 TABLET: 300-60 | 30 days supply | Qty: 120 | Fill #1

## 2016-05-10 MED FILL — diazePAM 5 MG TABS: 5 | 30 days supply | Qty: 60 | Fill #2

## 2016-05-12 ENCOUNTER — Telehealth: Payer: Self-pay | Admitting: Family Medicine

## 2016-05-12 DIAGNOSIS — R079 Chest pain, unspecified: Secondary | ICD-10-CM

## 2016-05-12 DIAGNOSIS — R0602 Shortness of breath: Secondary | ICD-10-CM

## 2016-05-12 NOTE — Telephone Encounter (Signed)
Pt called requesting a new referral for Pulmonary and Cardiology because she has her medical  coverage now

## 2016-05-12 NOTE — Telephone Encounter (Signed)
Will route to PCP 

## 2016-05-14 NOTE — Telephone Encounter (Signed)
Referral placed.

## 2016-05-17 ENCOUNTER — Ambulatory Visit (HOSPITAL_BASED_OUTPATIENT_CLINIC_OR_DEPARTMENT_OTHER): Payer: Self-pay | Admitting: Physical Medicine & Rehabilitation

## 2016-05-17 ENCOUNTER — Encounter: Payer: Self-pay | Attending: Physical Medicine & Rehabilitation

## 2016-05-17 ENCOUNTER — Encounter: Payer: Self-pay | Admitting: Physical Medicine & Rehabilitation

## 2016-05-17 VITALS — BP 173/97 | HR 75 | Resp 14

## 2016-05-17 DIAGNOSIS — M1288 Other specific arthropathies, not elsewhere classified, other specified site: Secondary | ICD-10-CM

## 2016-05-17 DIAGNOSIS — M5412 Radiculopathy, cervical region: Secondary | ICD-10-CM | POA: Insufficient documentation

## 2016-05-17 DIAGNOSIS — G8929 Other chronic pain: Secondary | ICD-10-CM | POA: Insufficient documentation

## 2016-05-17 DIAGNOSIS — M4802 Spinal stenosis, cervical region: Secondary | ICD-10-CM

## 2016-05-17 DIAGNOSIS — M542 Cervicalgia: Secondary | ICD-10-CM | POA: Insufficient documentation

## 2016-05-17 DIAGNOSIS — Z5181 Encounter for therapeutic drug level monitoring: Secondary | ICD-10-CM

## 2016-05-17 DIAGNOSIS — M5382 Other specified dorsopathies, cervical region: Secondary | ICD-10-CM | POA: Insufficient documentation

## 2016-05-17 DIAGNOSIS — M47816 Spondylosis without myelopathy or radiculopathy, lumbar region: Secondary | ICD-10-CM

## 2016-05-17 DIAGNOSIS — M545 Low back pain: Secondary | ICD-10-CM | POA: Insufficient documentation

## 2016-05-17 DIAGNOSIS — Z79899 Other long term (current) drug therapy: Secondary | ICD-10-CM

## 2016-05-17 DIAGNOSIS — M47812 Spondylosis without myelopathy or radiculopathy, cervical region: Secondary | ICD-10-CM

## 2016-05-17 DIAGNOSIS — M47896 Other spondylosis, lumbar region: Secondary | ICD-10-CM | POA: Insufficient documentation

## 2016-05-17 NOTE — Patient Instructions (Signed)
Back Exercises Introduction If you have pain in your back, do these exercises 2-3 times each day or as told by your doctor. When the pain goes away, do the exercises once each day, but repeat the steps more times for each exercise (do more repetitions). If you do not have pain in your back, do these exercises once each day or as told by your doctor. Exercises Single Knee to Chest  Do these steps 3-5 times in a row for each leg: 1. Lie on your back on a firm bed or the floor with your legs stretched out. 2. Bring one knee to your chest. 3. Hold your knee to your chest by grabbing your knee or thigh. 4. Pull on your knee until you feel a gentle stretch in your lower back. 5. Keep doing the stretch for 10-30 seconds. 6. Slowly let go of your leg and straighten it. Pelvic Tilt  Do these steps 5-10 times in a row: 1. Lie on your back on a firm bed or the floor with your legs stretched out. 2. Bend your knees so they point up to the ceiling. Your feet should be flat on the floor. 3. Tighten your lower belly (abdomen) muscles to press your lower back against the floor. This will make your tailbone point up to the ceiling instead of pointing down to your feet or the floor. 4. Stay in this position for 5-10 seconds while you gently tighten your muscles and breathe evenly. Cat-Cow  Do these steps until your lower back bends more easily: 1. Get on your hands and knees on a firm surface. Keep your hands under your shoulders, and keep your knees under your hips. You may put padding under your knees. 2. Let your head hang down, and make your tailbone point down to the floor so your lower back is round like the back of a cat. 3. Stay in this position for 5 seconds. 4. Slowly lift your head and make your tailbone point up to the ceiling so your back hangs low (sags) like the back of a cow. 5. Stay in this position for 5 seconds. Press-Ups  Do these steps 5-10 times in a row: 1. Lie on your belly  (face-down) on the floor. 2. Place your hands near your head, about shoulder-width apart. 3. While you keep your back relaxed and keep your hips on the floor, slowly straighten your arms to raise the top half of your body and lift your shoulders. Do not use your back muscles. To make yourself more comfortable, you may change where you place your hands. 4. Stay in this position for 5 seconds. 5. Slowly return to lying flat on the floor. Bridges  Do these steps 10 times in a row: 1. Lie on your back on a firm surface. 2. Bend your knees so they point up to the ceiling. Your feet should be flat on the floor. 3. Tighten your butt muscles and lift your butt off of the floor until your waist is almost as high as your knees. If you do not feel the muscles working in your butt and the back of your thighs, slide your feet 1-2 inches farther away from your butt. 4. Stay in this position for 3-5 seconds. 5. Slowly lower your butt to the floor, and let your butt muscles relax. If this exercise is too easy, try doing it with your arms crossed over your chest. Belly Crunches  Do these steps 5-10 times in a row: 1. Lie   on your back on a firm bed or the floor with your legs stretched out. 2. Bend your knees so they point up to the ceiling. Your feet should be flat on the floor. 3. Cross your arms over your chest. 4. Tip your chin a little bit toward your chest but do not bend your neck. 5. Tighten your belly muscles and slowly raise your chest just enough to lift your shoulder blades a tiny bit off of the floor. 6. Slowly lower your chest and your head to the floor. Back Lifts  Do these steps 5-10 times in a row: 1. Lie on your belly (face-down) with your arms at your sides, and rest your forehead on the floor. 2. Tighten the muscles in your legs and your butt. 3. Slowly lift your chest off of the floor while you keep your hips on the floor. Keep the back of your head in line with the curve in your back.  Look at the floor while you do this. 4. Stay in this position for 3-5 seconds. 5. Slowly lower your chest and your face to the floor. Contact a doctor if:  Your back pain gets a lot worse when you do an exercise.  Your back pain does not lessen 2 hours after you exercise. If you have any of these problems, stop doing the exercises. Do not do them again unless your doctor says it is okay. Get help right away if:  You have sudden, very bad back pain. If this happens, stop doing the exercises. Do not do them again unless your doctor says it is okay. This information is not intended to replace advice given to you by your health care provider. Make sure you discuss any questions you have with your health care provider. Document Released: 04/17/2010 Document Revised: 08/21/2015 Document Reviewed: 05/09/2014  2017 [employer]  Please call if you wish to schedule either back or neck or shoulder injections

## 2016-05-17 NOTE — Progress Notes (Signed)
Subjective:    Patient ID: Charlene Allen, female    DOB: 1955-08-04, 61 y.o.   MRN: [id number]  HPI  Chief complaint is neck pain as well as low back pain. Onset of pain, patient initially said it was 1989. We discussed that she had a MRI of the cervical spine 2009. Then, she thought maybe it was 2009. Her pain is along the cervical paraspinal area. She complains of reduced range of motion. She does not have any pain shooting down the arms. She denies any bowel or bladder complaints. No problems with walking. Does not require assistive device for ambulation. She is independent with all self-care. Her neck mainly bothers her when it is cold out. In terms of low back. She has pain in the lower lumbar area. Pain does not shoot down her legs. She does not have lower limb weakness. She has stiffness in her back and has difficulty playing with grandchildren Has had recent MRI of the cervical and lumbar spine on 02/02/2016. 61 year old female with primary complaint of neck and back pain. Consultation is for evaluation of interventional pain procedures MRI is reviewed with the patient.   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 During CMA questionnaire denied illegal drug use . Urine toxicology 2010 demonstrated cocaine  Pain Inventory Average Pain 8 Pain Right Now 8 My pain is sharp, dull and aching  In the last 24 hours, has pain interfered with the following? General activity 1 Relation with others 4 Enjoyment of life 1 What TIME of day is your pain at its worst? all Sleep (in general) Poor  Pain is worse with: walking, bending, sitting and standing Pain improves with: heat/ice Relief from Meds: 3  Mobility walk with assistance use a cane how many minutes can you walk? 5-10 ability to climb steps?  yes do you drive?  no  Function not employed: date last employed . I need assistance with the following:  household duties and  shopping  Neuro/Psych numbness tingling confusion depression  Prior Studies Any changes since last visit?  no CLINICAL DATA:  Chronic and worsening back pain.  EXAM: MRI LUMBAR SPINE WITHOUT CONTRAST  TECHNIQUE: Multiplanar, multisequence MR imaging of the lumbar spine was performed. No intravenous contrast was administered.  COMPARISON:  03/27/2014  FINDINGS: Segmentation:  Standard.  Alignment:  L4-5 grade 1 anterolisthesis, chronic  Vertebrae:  No fracture, evidence of discitis, or bone lesion.  Conus medullaris: Extends to the mid L2 level and appears normal.  Paraspinal and other soft tissues: The upper endometrium shows 9 mm thickness, unexpected in this post menopausal age patient. There is history of postmenopausal bleeding based on 10/11/2013 pelvic ultrasound. Endometrial stripe thickness is stable from prior.  Disc levels:  T12- L1: Spondylosis and disc narrowing.  No impingement  L1-L2: Mild facet spurring.  No herniation or impingement  L2-L3: Disc narrowing and annulus bulging.  No impingement  L3-L4: Facet arthropathy with advanced hypertrophy, especially on the right where the joint is more horizontal and distorted. No herniation. No impingement  L4-L5: Advanced facet arthropathy with chronic anterolisthesis. Mild noncompressive bilateral foraminal narrowing. Patent canal  L5-S1:Mild facet spurring.  No impingement.  IMPRESSION: 1. Stable compared to 2015.  No high-grade/compressive stenosis. 2. Advanced facet arthropathy at L3-4 and L4-5 with chronic grade 1 anterolisthesis at L4-5. 3. 9 mm endometrial stripe, unexpected for age but stable from 56. History of postmenopausal bleeding workup per ultrasound 10/11/2013, please ensure patient has appropriate gynecologic follow-up.   Electronically Signed   By:  Monte Fantasia M.D.   On: 02/02/2016 11:23  EXAM: MRI CERVICAL SPINE WITHOUT  CONTRAST  TECHNIQUE: Multiplanar, multisequence MR imaging of the cervical spine was performed. No intravenous contrast was administered.  COMPARISON:  11/15/2007  FINDINGS: Alignment: Chronic mild C3-4 anterolisthesis associated with severe right facet arthropathy.  Vertebrae: No fracture, evidence of discitis, or bone lesion.T2 small hemangioma.  Cord: Degenerative distortion as described below. No signal abnormality.  Posterior Fossa, vertebral arteries, paraspinal tissues: Negative  Disc levels:  C2-3: Mild facet spurring.  C3-4: Severe facet arthropathy, especially on the right. Progressed, advanced disc degeneration with bulky uncovertebral spurring. Canal stenosis with mild cord flattening, progressed. Severe right and at least moderate left foraminal stenosis.  C4-5: Advanced disc degeneration with posterior disc osteophyte complex, predominately disc. Mild bilateral facet spurring. Canal stenosis with moderate cord flattening. Mild right and severe left foraminal narrowing from uncovertebral ridging.  C5-6: Progressed posterior disc osteophyte complex, predominately disc. Bilateral uncovertebral ridging. Negative facets. Progress canal stenosis with cord flattening. Bilateral foraminal impingement.  C6-7: Degenerative disc narrowing that has progressed, with uncovertebral ridging greater on the left. Negative facets. Mild left foraminal stenosis.  C7-T1:Progressed degenerative disc narrowing with ridging. Negative facets. Mild left foraminal narrowing.  IMPRESSION: 1. Advanced degenerative disc disease and right C3-4 facet arthropathy that has progressed from 2009. 2. 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 Bilateral foraminal impingement at the same levels, particularly severe at C3-4. Physicians involved in your care Any changes since last visit?  no   Family History  Problem Relation Age of Onset  .  COPD Mother   . Stroke Mother   . Heart disease Mother   . Hypertension Mother   . Cancer Sister 25    cervical cancer   . Stroke Maternal Grandmother    Social History   Social History  . Marital status: Married    Spouse name: N/A  . Number of children: N/A  . Years of education: N/A   Social History Main Topics  . Smoking status: Current Every Day Smoker    Packs/day: 1.00    Years: 40.00  . Smokeless tobacco: Never Used  . Alcohol use No  . Drug use: No  . Sexual activity: Not Currently    Birth control/ protection: None   Other Topics Concern  . None   Social History Narrative  . None   Past Surgical History:  Procedure Laterality Date  . HAND SURGERY    . TUBAL LIGATION     Past Medical History:  Diagnosis Date  . Hypertension Dx 2015  . Obesity   . PMB (postmenopausal bleeding)    BP (!) 173/97   Pulse 75   Resp 14   SpO2 94%   Opioid Risk Score:   Fall Risk Score:  `1  Depression screen PHQ 2/9  Depression screen [facility] 2/9 05/17/2016 01/19/2016 12/19/2015 12/19/2015 11/18/2015 10/13/2015 09/09/2015  Decreased Interest 1 1 0 0 3 3 2   Down, Depressed, Hopeless 2 1 2 2 3 2 1   PHQ - 2 Score 3 2 2 2 6 5 3   Altered sleeping 3 2 0 - 1 2 1   Tired, decreased energy 3 1 3  - 2 2 3   Change in appetite 2 1 - - 1 2 3   Feeling bad or failure about yourself  1 1 1  - 2 0 2  Trouble concentrating 1 2 0 - 1 1 0  Moving slowly or fidgety/restless 3 2 1  -  1 3 3   Suicidal thoughts 0 2 0 - 0 0 0  PHQ-9 Score 16 13 7  - 14 15 15   Difficult doing work/chores Somewhat difficult - - - - - -  Some recent data might be hidden    Review of Systems  Constitutional: Negative.   HENT: Negative.   Eyes: Negative.   Respiratory: Negative.   Cardiovascular: Negative.   Gastrointestinal: Negative.   Endocrine: Negative.   Genitourinary: Negative.   Musculoskeletal: Positive for arthralgias, back pain, gait problem, myalgias, neck pain and neck stiffness.  Skin: Negative.    Allergic/Immunologic: Negative.   Neurological: Positive for numbness.       Tingling  Hematological: Negative.   Psychiatric/Behavioral: Positive for confusion and dysphoric mood.  All other systems reviewed and are negative.      Objective:   Physical Exam  Constitutional: She is oriented to person, place, and time. She appears well-developed and well-nourished.  HENT:  Head: Normocephalic and atraumatic.  Eyes: Conjunctivae and EOM are normal. Pupils are equal, round, and reactive to light.  Neck: Neck supple. No JVD present.  Cardiovascular: Normal rate, regular rhythm, normal heart sounds and intact distal pulses.   No murmur heard. Pulmonary/Chest: Effort normal and breath sounds normal. No stridor. No respiratory distress. She has no wheezes.  Abdominal: Soft. Bowel sounds are normal. She exhibits no distension. There is no tenderness.  Neurological: She is alert and oriented to person, place, and time. She displays no atrophy. No cranial nerve deficit. She exhibits normal muscle tone. Coordination and gait normal.  Reflex Scores:      Tricep reflexes are 1+ on the right side and 1+ on the left side.      Bicep reflexes are 1+ on the right side and 1+ on the left side.      Brachioradialis reflexes are 1+ on the right side and 1+ on the left side.      Patellar reflexes are 2+ on the right side and 3+ on the left side.      Achilles reflexes are 1+ on the right side and 1+ on the left side. Sensation normal to pinprick bilateral C5, C6-C7, C8, L3, L4, L5 dermatomal distribution. Negative straight leg raise testing. Her strength is 5/5 bilateral deltoid by suppressive grip, hip flexion, extension, ankle dorsiflexion.  Skin: Skin is warm and dry.  Psychiatric: She has a normal mood and affect. Her behavior is normal. Judgment and thought content normal.  Nursing note and vitals reviewed.         Assessment & Plan:  1. Chronic neck pain, radiculopathy. MRI consistent  with cervical degenerative disc as well as facet arthropathy. Has multilevel involvement with spinal stenosis, but no evidence of myelopathy or radiculopathy. She has no bowel or bladder dysfunction. No gait disorder. At this point do not think she is a surgical referral. We discussed pain management and recommendations for cervical medial branch blocks. She will think about this. She thinks her neck pain is more seasonal mainly in the winter months in response to heat. Would recommend continuing this for now. Physical therapy may be helpful. However, she has transportation issues. She is not a good candidate for chronic opioids. Her high risk and opioid wrist will. Tramadol or low-dose codeine would be within [facility] guidelines.  2. Low back pain. Also has evidence of lumbar spondylosis primarily. She has very little in terms of disc degeneration. No signs of radiculopathy or myelopathy. No evidence of lumbar  stenosis. Given that this is her more significant and consistent pain, would recommend lumbar medial branch blocks, starting at L 5 S1, i.e., L4 medial branch and L5 dorsal ramus bilaterally.

## 2016-05-18 NOTE — Addendum Note (Signed)
Addended by: Marland Mcalpine B on: 05/18/2016 02:15 PM   Modules accepted: Orders

## 2016-05-27 LAB — 6-ACETYLMORPHINE,TOXASSURE ADD
6-ACETYLMORPHINE: NEGATIVE
6-acetylmorphine: NOT DETECTED ng/mg creat

## 2016-05-27 LAB — TOXASSURE SELECT,+ANTIDEPR,UR

## 2016-05-27 NOTE — Progress Notes (Signed)
Urine drug screen for this encounter is inconsistent. Positive for prescribed tylenol #4 resulting in high levels of codeine/morphine and metabolites. Diazepam is prescribed as well.  Inconsistent because of THC and methadone present.  To be "Injection only" per Dr Letta Pate

## 2016-06-03 MED FILL — ACETAMINOPHEN/COD #4 TABLET: 300-60 | 30 days supply | Qty: 120 | Fill #2

## 2016-06-07 ENCOUNTER — Other Ambulatory Visit: Payer: Self-pay | Admitting: Family Medicine

## 2016-06-07 DIAGNOSIS — I10 Essential (primary) hypertension: Secondary | ICD-10-CM

## 2016-06-07 MED FILL — LOSARTAN POTASSIUM 100 MG T: 100 | 30 days supply | Qty: 30 | Fill #4

## 2016-06-07 MED FILL — ?ATORVASTATIN 20 MG TABLET: 20 | 30 days supply | Qty: 30 | Fill #7

## 2016-06-08 ENCOUNTER — Other Ambulatory Visit: Payer: Self-pay | Admitting: Pharmacist

## 2016-06-08 DIAGNOSIS — I10 Essential (primary) hypertension: Secondary | ICD-10-CM

## 2016-06-08 MED ORDER — HYDRALAZINE HCL 25 MG PO TABS
25.0000 mg | ORAL_TABLET | Freq: Four times a day (QID) | ORAL | 0 refills | Status: DC
Start: 2016-06-08 — End: 2016-07-22

## 2016-06-08 MED FILL — hydrALAZINE HCL 25 MG TABS: 25 | 30 days supply | Qty: 120 | Fill #0

## 2016-06-17 ENCOUNTER — Ambulatory Visit: Payer: Self-pay | Admitting: Physical Medicine & Rehabilitation

## 2016-06-18 ENCOUNTER — Ambulatory Visit (INDEPENDENT_AMBULATORY_CARE_PROVIDER_SITE_OTHER)
Admission: RE | Admit: 2016-06-18 | Discharge: 2016-06-18 | Disposition: A | Discharge status: Home / Self Care | Payer: No Typology Code available for payment source | Source: Ambulatory Visit | Attending: Internal Medicine | Admitting: Internal Medicine

## 2016-06-18 ENCOUNTER — Ambulatory Visit (INDEPENDENT_AMBULATORY_CARE_PROVIDER_SITE_OTHER): Payer: No Typology Code available for payment source | Admitting: Internal Medicine

## 2016-06-18 ENCOUNTER — Encounter: Payer: Self-pay | Admitting: Internal Medicine

## 2016-06-18 VITALS — BP 168/86 | HR 71 | Ht 63.0 in | Wt 198.8 lb

## 2016-06-18 DIAGNOSIS — J449 Chronic obstructive pulmonary disease, unspecified: Secondary | ICD-10-CM

## 2016-06-18 DIAGNOSIS — F17210 Nicotine dependence, cigarettes, uncomplicated: Secondary | ICD-10-CM

## 2016-06-18 MED ORDER — BUDESONIDE-FORMOTEROL FUMARATE 80-4.5 MCG/ACT IN AERO: INHALATION_SPRAY | RESPIRATORY_TRACT | 11 refills | Status: DC

## 2016-06-18 MED ORDER — BUDESONIDE-FORMOTEROL FUMARATE 80-4.5 MCG/ACT IN AERO: 2.0000 | INHALATION_SPRAY | Freq: Two times a day (BID) | RESPIRATORY_TRACT | 0 refills | Status: DC

## 2016-06-18 NOTE — Assessment & Plan Note (Signed)

## 2016-06-18 NOTE — Progress Notes (Signed)
Spoke with pt and notified of results per Dr. Wert. Pt verbalized understanding and denied any questions. 

## 2016-06-18 NOTE — Progress Notes (Signed)
Subjective:     Patient ID: Charlene Allen, female   DOB: 1955-07-02,    MRN: [id number]  HPI   89 yowf active smoker healthy childhood and good ex tol walking dogs including hills but back problems starting around 2015 @ 145 then gained  wt and doe onset around 2016 no better with albuterol so referred to pulmonary clinic 06/18/2016 by Dr  Dr Adrian Blackwater    06/18/2016 1st [facility] office visit/ Charlene Allen   Chief Complaint  Patient presents with  . Pulmonary Consult    Referred by Dr. Boykin Nearing.  Pt c/o SOB for the past 1 1/2 years. She states she feels SOB "all the time" worse with exertion such as just walking her dog around the yard.  She uses albuterol inhaler 3-4 x per day. She has occ cough with clear sputum.    advair started about 1.5 y prior to OV  And no better  [facility] = can't walk a nl pace on a flat grade s sob but does fine slow and flat eg shopping / pushing cart    No obvious day to day or daytime variability or assoc excess/ purulent sputum or mucus plugs or hemoptysis or cp or chest tightness, subjective wheeze or overt sinus or hb symptoms. No unusual exp hx or h/o childhood pna/ asthma or knowledge of premature birth.  Sleeping ok without nocturnal  or early am exacerbation  of respiratory  c/o's or need for noct saba. Also denies any obvious fluctuation of symptoms with weather or environmental changes or other aggravating or alleviating factors except as outlined above   Current Medications, Allergies, Complete Past Medical History, Past Surgical History, Family History, and Social History were reviewed in [employer] record.  ROS  The following are not active complaints unless bolded sore throat, dysphagia, dental problems, itching, sneezing,  nasal congestion or excess/ purulent secretions, ear ache,   fever, chills, sweats, unintended wt loss, classically pleuritic or exertional cp,  orthopnea pnd or leg swelling, presyncope, palpitations,  abdominal pain, anorexia, nausea, vomiting, diarrhea  or change in bowel or bladder habits, change in stools or urine, dysuria,hematuria,  rash, arthralgias, visual complaints, headache, numbness, weakness or ataxia or problems with walking or coordination,  change in mood/affect or memory.           Review of Systems     Objective:   Physical Exam      amb obese wf nad  Wt Readings from Last 3 Encounters:  06/18/16 198 lb 12.8 oz (90.2 kg)  01/19/16 200 lb 3.2 oz (90.8 kg)  12/19/15 201 lb (91.2 kg)    Vital signs reviewed  - Note on arrival 02 sats  97% on RA     HEENT: nl   turbinates bilaterally, and oropharynx. Nl external ear canals without cough reflex - poor dentition top plate     NECK :  without JVD/Nodes/TM/ nl carotid upstrokes bilaterally   LUNGS: no acc muscle use,  Nl contour chest which is clear to A and P bilaterally without cough on insp or exp maneuvers   CV:  RRR  no s3 or murmur or increase in P2, and no edema   ABD:  Obese/ soft and nontender with nl inspiratory excursion in the supine position. No bruits or organomegaly appreciated, bowel sounds nl  MS:  Nl gait/ ext warm without deformities, calf tenderness, cyanosis or clubbing No obvious joint restrictions   SKIN: warm and dry without lesions  NEURO:  alert, approp, nl sensorium with  no motor or cerebellar deficits apparent.     CXR PA and Lateral:   06/18/2016 :    I personally reviewed images and agree with radiology impression as follows:   No active cardiopulmonary disease. Mild degenerative changes mid and lower thoracic spine.    Assessment:

## 2016-06-18 NOTE — Patient Instructions (Addendum)
Stop advair   Plan A = Automatic = symbicort 80 Take 2 puffs first thing in am and then another 2 puffs about 12 hours later.   Work on inhaler technique:  relax and gently blow all the way out then take a nice smooth deep breath back in, triggering the inhaler at same time you start breathing in.  Hold for up to 5 seconds if you can. Blow out thru nose. Rinse and gargle with water when done    Plan B = Backup Only use your albuterol as a rescue medication to be used if you can't catch your breath by resting or doing a relaxed purse lip breathing pattern.  - The less you use it, the better it will work when you need it. - Ok to use the inhaler up to 2 puffs  every 4 hours if you must but call for appointment if use goes up over your usual need - Don't leave home without it !!  (think of it like the spare tire for your car)     The key is to stop smoking completely before smoking completely stops you - it's definitely not too late!!!  Please remember to go to the  x-ray department downstairs in the basement  for your tests - we will call you with the results when they are available.      Please schedule a follow up office visit in 6 weeks, call sooner if needed

## 2016-06-18 NOTE — Assessment & Plan Note (Signed)
Spirometry 06/18/2016  No significant obst/ minimal curvature in effort indep section of f/v curve  - 06/18/2016  After extensive coaching HFA effectiveness =    75% > try symb 80 2bid   She has GOLD 0 criteria but Group B symptoms so Symptoms are markedly disproportionate to objective findings and not clear this is a lung problem but pt does appear to have difficult airway management issues. DDX of  difficult airways management almost all start with A and  include Adherence, Ace Inhibitors, Acid Reflux, Active Sinus Disease, Alpha 1 Antitripsin deficiency, Anxiety masquerading as Airways dz,  ABPA,  Allergy(esp in young), Aspiration (esp in elderly), Adverse effects of meds,  Active smokers, A bunch of PE's (a small clot burden can't cause this syndrome unless there is already severe underlying pulm or vascular dz with poor reserve) plus two Bs  = Bronchiectasis and Beta blocker use..and one C= CHF   Adherence is always the initial "prime suspect" and is a multilayered concern that requires a "trust but verify" approach in every patient - starting with knowing how to use medications, especially inhalers, correctly, keeping up with refills and understanding the fundamental difference between maintenance and prns vs those medications only taken for a very short course and then stopped and not refilled.  - see hfa teaching  Active smoking (see separate a/p)   ? Adverse effects of dpi > try off    ? Anxiety related to deconditioning / wt gain > usually at the bottom of this list of usual suspects but should be much higher on this pt's based on H and P and note already on psychotropics .   ? chf/ diastolic dysfunction > f/u cards pending   Total time devoted to counseling  > 50 % of initial 60 min office visit:  review case with pt/ discussion of options/alternatives/ personally creating written customized instructions  in presence of pt  then going over those specific  Instructions directly with the pt  including how to use all of the meds but in particular covering each new medication in detail and the difference between the maintenance= "automatic" meds and the prns using an action plan format for the latter (If this problem/symptom => do that organization reading Left to right).  Please see AVS from this visit for a full list of these instructions which I personally wrote for this pt and  are unique to this visit.

## 2016-06-20 NOTE — Assessment & Plan Note (Signed)
Body mass index is 35.22 kg/m.   Lab Results  Component Value Date   TSH 2.408 10/05/2013     Contributing to gerd risk/ doe/reviewed the need and the process to achieve and maintain neg calorie balance > defer f/u primary care including intermittently monitoring thyroid status

## 2016-06-22 ENCOUNTER — Telehealth: Payer: Self-pay | Admitting: Family Medicine

## 2016-06-22 DIAGNOSIS — M5442 Lumbago with sciatica, left side: Principal | ICD-10-CM

## 2016-06-22 DIAGNOSIS — M542 Cervicalgia: Secondary | ICD-10-CM

## 2016-06-22 DIAGNOSIS — M5441 Lumbago with sciatica, right side: Principal | ICD-10-CM

## 2016-06-22 DIAGNOSIS — G8929 Other chronic pain: Secondary | ICD-10-CM

## 2016-06-22 NOTE — Telephone Encounter (Signed)
Pt called requesting a refill on her Diazapam and Tylenol #4. She has an appt scheduled on 07/22/16 but wants to get meds to get her through until her appt. Please f/u. Thank you.

## 2016-06-22 NOTE — Telephone Encounter (Signed)
I cannot refill these and Dr. Adrian Blackwater is out this week. Will forward to covering physician, Dr. Jarold Song.

## 2016-06-23 ENCOUNTER — Encounter: Payer: Self-pay | Admitting: Interventional Cardiology

## 2016-06-24 MED ORDER — ACETAMINOPHEN-CODEINE #4 300-60 MG PO TABS: 1.0000 | ORAL_TABLET | Freq: Four times a day (QID) | ORAL | 0 refills | Status: DC | PRN

## 2016-06-24 NOTE — Telephone Encounter (Signed)
Pt was called and informed of script being ready for pick up.

## 2016-06-24 NOTE — Telephone Encounter (Signed)
Refilled Tylenol #4; Diazepam will need to come from PCP.

## 2016-07-04 NOTE — Progress Notes (Addendum)
Cardiology Office Note   Date:  07/05/2016   ID:  Charlene Allen, DOB 11/11/1955, MRN [id number]  PCP:  Charlene Ends, MD    No chief complaint on file. Chest pain   Wt Readings from Last 3 Encounters:  07/05/16 194 lb (88 kg)  06/18/16 198 lb 12.8 oz (90.2 kg)  01/19/16 200 lb 3.2 oz (90.8 kg)       History of Present Illness: Charlene Allen is a 61 y.o. female  Referred by Dr. Adrian Blackwater for chest pain, who has a h/o tobacco abuse who had a chest xray.  SHe was sent to t pulmonologist and her lungs were ok per her report.  Her heart was enlarged so she is sent here for further eval.    She has had several episodes of chest pain.  Most recent episode was yesterday after some stress.  It was a pressure on her chest going to the left arm.  She has other episodes that were more severe, including an episode a few months ago that woke her from sleep.    Walking up a hill is her most strenuous exercise and she has DOE and feels her heart pumping.    SHe is trying to quit smoking.    Mother had CAD in her late 32s.  No other siblings with heart trouble.      Past Medical History:  Diagnosis Date  . Hypertension Dx 2015  . Obesity   . PMB (postmenopausal bleeding)     Past Surgical History:  Procedure Laterality Date  . HAND SURGERY    . TUBAL LIGATION       Current Outpatient Prescriptions  Medication Sig Dispense Refill  . acetaminophen-codeine (TYLENOL #4) 300-60 MG tablet Take 1 tablet by mouth every 6 (six) hours as needed for moderate pain. 90 tablet 0  . albuterol (PROVENTIL HFA;VENTOLIN HFA) 108 (90 Base) MCG/ACT inhaler Inhale 2 puffs into the lungs every 6 (six) hours as needed for wheezing or shortness of breath. 1 Inhaler 3  . aspirin 81 MG tablet Take 1 tablet (81 mg total) by mouth daily. 90 tablet 3  . atorvastatin (LIPITOR) 20 MG tablet Take 1 tablet (20 mg total) by mouth daily. 90 tablet 3  . budesonide-formoterol (SYMBICORT) 80-4.5 MCG/ACT  inhaler Take 2 puffs first thing in am and then another 2 puffs about 12 hours later. 1 Inhaler 11  . diazepam (VALIUM) 5 MG tablet Take 1 tablet (5 mg total) by mouth every 12 (twelve) hours as needed for anxiety. 60 tablet 2  . hydrALAZINE (APRESOLINE) 25 MG tablet Take 1 tablet (25 mg total) by mouth 4 (four) times daily. 120 tablet 0  . ibuprofen (ADVIL,MOTRIN) 600 MG tablet Take 1 tablet (600 mg total) by mouth every 8 (eight) hours as needed. 60 tablet 2  . losartan (COZAAR) 100 MG tablet Take 1 tablet (100 mg total) by mouth daily. 90 tablet 3  . methocarbamol (ROBAXIN) 500 MG tablet Take 1 tablet (500 mg total) by mouth 2 (two) times daily. 20 tablet 0  . nicotine (NICODERM CQ - DOSED IN MG/24 HOURS) 21 mg/24hr patch Place 21 mg onto the skin daily.    . nitroGLYCERIN (NITROSTAT) 0.4 MG SL tablet Place 1 tablet (0.4 mg total) under the tongue every 5 (five) minutes as needed for chest pain. If you need more than 2 doses call 911 20 tablet 1   No current facility-administered medications for this visit.     Allergies:  Patient has no known allergies.    Social History:  The patient  reports that she has been smoking.  She has a 40.00 pack-year smoking history. She has never used smokeless tobacco. She reports that she does not drink alcohol or use drugs.   Family History:  The patient's family history includes COPD in her mother; Cancer (age of onset: 50) in her sister; Heart disease in her mother; Hypertension in her mother; Stroke in her maternal grandmother and mother.    ROS:  Please see the history of present illness.   Otherwise, review of systems are positive for chest pain.   All other systems are reviewed and negative.    PHYSICAL EXAM: VS:  BP (!) 144/80   Pulse 61   Resp 16   Ht 5\' 3"  (1.6 m)   Wt 194 lb (88 kg)   BMI 34.37 kg/m  , BMI Body mass index is 34.37 kg/m. GEN: Well nourished, well developed, in no acute distress , smells of cigarettes HEENT: normal    Neck: no JVD, carotid bruits, or masses Cardiac: RRR; no murmurs, rubs, or gallops,no edema , 2+ PT pulses bilaterally Respiratory:  clear to auscultation bilaterally, normal work of breathing GI: soft, nontender, nondistended, + BS MS: no deformity or atrophy  Skin: warm and dry, no rash Neuro:  Strength and sensation are intact Psych: euthymic mood, full affect   EKG:   The ekg ordered today demonstrates normal sinus rhythm, nonspecific changes in the anterior leads   Recent Labs: 08/12/2015: ALT 18; BUN 9; Creat 0.84; Hemoglobin 14.6; Magnesium 2.0; Platelets 341; Potassium 3.8; Sodium 140   Lipid Panel    Component Value Date/Time   CHOL 197 08/12/2015 1145   TRIG 218 (H) 08/12/2015 1145   HDL 42 (L) 08/12/2015 1145   CHOLHDL 4.7 08/12/2015 1145   VLDL 44 (H) 08/12/2015 1145   LDLCALC 111 08/12/2015 1145     Other studies Reviewed: Additional studies/ records that were reviewed today with results demonstrating: Echocardiogram from 2017 showing normal left ventricular function with LVH.     ASSESSMENT AND PLAN:  1. Chest pain: several typical features.  Will start long acting nitrates, Imdur 30 mg daily. The risks benefits cardiac catheterization were explained the patient and she is willing to proceed.  Depending on her coronary anatomy, her LDL target may change.  All questions answered. 2. HTN: Borderline control today. Continue current medicines. 3. Tobacco abuse: I stressed the importance of stopping smoking. She is trying a nicotine patch intermittently to help stop smoking.   Current medicines are reviewed at length with the patient today.  The patient concerns regarding her medicines were addressed.  The following changes have been made:  Start isosorbide  Labs/ tests ordered today include: No orders of the defined types were placed in this encounter.   Recommend 150 minutes/week of aerobic exercise Low fat, low carb, high fiber diet  recommended  Disposition:   FU in post cath   Signed, Larae Grooms, MD  07/05/2016 9:50 AM    [facility] [place], [place], [place]  [address] Phone: [phone]; Fax: [phone]

## 2016-07-05 ENCOUNTER — Encounter: Payer: Self-pay | Admitting: Interventional Cardiology

## 2016-07-05 ENCOUNTER — Ambulatory Visit (INDEPENDENT_AMBULATORY_CARE_PROVIDER_SITE_OTHER): Payer: No Typology Code available for payment source | Admitting: Interventional Cardiology

## 2016-07-05 VITALS — BP 144/80 | HR 61 | Resp 16 | Ht 63.0 in | Wt 194.0 lb

## 2016-07-05 DIAGNOSIS — R072 Precordial pain: Secondary | ICD-10-CM

## 2016-07-05 DIAGNOSIS — F17210 Nicotine dependence, cigarettes, uncomplicated: Secondary | ICD-10-CM

## 2016-07-05 DIAGNOSIS — I10 Essential (primary) hypertension: Secondary | ICD-10-CM

## 2016-07-05 MED ORDER — ISOSORBIDE MONONITRATE ER 30 MG PO TB24: 30.0000 mg | ORAL_TABLET | Freq: Every day | ORAL | 11 refills | Status: DC

## 2016-07-05 NOTE — Patient Instructions (Signed)
Medication Instructions:  1. Start Imdur 30 mg daily.   Labwork: TODAY: Pre-procedure labwork: CBC, BMET, PT/INR/ PTT  Testing/Procedures: Your physician has requested that you have a cardiac catheterization. Cardiac catheterization is used to diagnose and/or treat various heart conditions. Doctors may recommend this procedure for a number of different reasons. The most common reason is to evaluate chest pain. Chest pain can be a symptom of coronary artery disease (CAD), and cardiac catheterization can show whether plaque is narrowing or blocking your heart's arteries. This procedure is also used to evaluate the valves, as well as measure the blood flow and oxygen levels in different parts of your heart. For further information please visit [email or web address]. Please follow instruction sheet, as given.    Follow-Up: Your physician recommends that you schedule a follow-up appointment with Dr. Irish Lack pending the results of the cath.   Any Other Special Instructions Will Be Listed Below (If Applicable).    [facility] [address], [place] [address] Dept: [phone] Loc: [place]  07/05/2016  You are scheduled for a Cardiac Catheterization on Monday, April 16 with Dr. Larae Grooms.  1. Please arrive at the [facility] (Main Entrance A) at [facility]: [place], [place] [address] at 10:00 AM (two hours before your procedure to ensure your preparation). Free valet parking service is available.   Special note: Every effort is made to have your procedure done on time. Please understand that emergencies sometimes delay scheduled procedures.  2. Diet: Do not eat or drink anything after midnight prior to your procedure except sips of water to take medications.  3. Labs: None needed.  4. Medication instructions in preparation for your  procedure:   1. Hold hydralazine the morning of your procedure.       On the morning of your procedure, take your Aspirin and any morning medicines NOT listed above.  You may use sips of water.  5. Plan for one night stay--bring personal belongings. 6. Bring a current list of your medications and current insurance cards. 7. You MUST have a responsible person to drive you home. 8. Someone MUST be with you the first 24 hours after you arrive home or your discharge will be delayed. 9. Please wear clothes that are easy to get on and off and wear slip-on shoes.  Thank you for allowing [place] to care for you!   -- [facility]    If you need a refill on your cardiac medications before your next appointment, please call your pharmacy.

## 2016-07-06 ENCOUNTER — Other Ambulatory Visit: Payer: Self-pay | Admitting: Family Medicine

## 2016-07-06 DIAGNOSIS — M549 Dorsalgia, unspecified: Principal | ICD-10-CM

## 2016-07-06 DIAGNOSIS — M542 Cervicalgia: Secondary | ICD-10-CM

## 2016-07-06 DIAGNOSIS — G8929 Other chronic pain: Secondary | ICD-10-CM

## 2016-07-06 LAB — BASIC METABOLIC PANEL
BUN/Creatinine Ratio: 17 (ref 9–23)
BUN: 12 mg/dL (ref 6–24)
CALCIUM: 9.8 mg/dL (ref 8.7–10.2)
CO2: 27 mmol/L (ref 18–29)
CREATININE: 0.72 mg/dL (ref 0.57–1.00)
Chloride: 103 mmol/L (ref 96–106)
GFR, EST AFRICAN AMERICAN: 106 mL/min/{1.73_m2} (ref >59)
GFR, EST NON AFRICAN AMERICAN: 92 mL/min/{1.73_m2} (ref >59)
Glucose: 97 mg/dL (ref 65–99)
Potassium: 4.7 mmol/L (ref 3.5–5.2)
Sodium: 142 mmol/L (ref 134–144)

## 2016-07-06 LAB — CBC WITH DIFFERENTIAL/PLATELET
BASOS: 0 %
Basophils Absolute: 0 10*3/uL (ref 0.0–0.2)
EOS (ABSOLUTE): 0.3 10*3/uL (ref 0.0–0.4)
Eos: 3 %
HEMATOCRIT: 40.4 % (ref 34.0–46.6)
HEMOGLOBIN: 13.8 g/dL (ref 11.1–15.9)
IMMATURE GRANULOCYTES: 0 %
Immature Grans (Abs): 0 10*3/uL (ref 0.0–0.1)
LYMPHS ABS: 3.2 10*3/uL — AB (ref 0.7–3.1)
LYMPHS: 32 %
MCH: 28.5 pg (ref 26.6–33.0)
MCHC: 34.2 g/dL (ref 31.5–35.7)
MCV: 83 fL (ref 79–97)
MONOCYTES: 6 %
MONOS ABS: 0.6 10*3/uL (ref 0.1–0.9)
NEUTROS PCT: 59 %
Neutrophils Absolute: 6.1 10*3/uL (ref 1.4–7.0)
Platelets: 370 10*3/uL (ref 150–379)
RBC: 4.85 x10E6/uL (ref 3.77–5.28)
RDW: 13.6 % (ref 12.3–15.4)
WBC: 10.2 10*3/uL (ref 3.4–10.8)

## 2016-07-06 LAB — APTT: APTT: 27 s (ref 24–33)

## 2016-07-06 LAB — PROTIME-INR
INR: 0.9 (ref 0.8–1.2)
PROTHROMBIN TIME: 9.8 s (ref 9.1–12.0)

## 2016-07-06 MED FILL — LOSARTAN POTASSIUM 100 MG T: 100 | 30 days supply | Qty: 30 | Fill #5

## 2016-07-06 MED FILL — ?ATORVASTATIN 20 MG TABLET: 20 | 30 days supply | Qty: 30 | Fill #8

## 2016-07-08 ENCOUNTER — Ambulatory Visit: Payer: Self-pay | Admitting: Physical Medicine & Rehabilitation

## 2016-07-08 ENCOUNTER — Telehealth: Payer: Self-pay | Admitting: Interventional Cardiology

## 2016-07-08 ENCOUNTER — Ambulatory Visit: Payer: Self-pay

## 2016-07-08 NOTE — Telephone Encounter (Signed)
Patient made aware of results. Patient verbalizes understanding.  

## 2016-07-08 NOTE — Telephone Encounter (Signed)
Patient calling for lab work results. Thanks.

## 2016-07-08 NOTE — Telephone Encounter (Signed)
-----   Message from Jettie Booze, MD sent at 07/08/2016  2:13 PM EDT ----- Labs stable for cath,

## 2016-07-12 ENCOUNTER — Encounter (HOSPITAL_COMMUNITY)
Admission: RE | Disposition: A | Discharge status: Home / Self Care | Payer: Self-pay | Source: Ambulatory Visit | Attending: Interventional Cardiology

## 2016-07-12 ENCOUNTER — Encounter (HOSPITAL_COMMUNITY): Payer: Self-pay | Admitting: Interventional Cardiology

## 2016-07-12 ENCOUNTER — Ambulatory Visit (HOSPITAL_COMMUNITY)
Admission: RE | Admit: 2016-07-12 | Discharge: 2016-07-12 | Disposition: A | Discharge status: Home / Self Care | Payer: No Typology Code available for payment source | Source: Ambulatory Visit | Attending: Interventional Cardiology | Admitting: Interventional Cardiology

## 2016-07-12 DIAGNOSIS — R072 Precordial pain: Secondary | ICD-10-CM

## 2016-07-12 DIAGNOSIS — Z7951 Long term (current) use of inhaled steroids: Secondary | ICD-10-CM | POA: Insufficient documentation

## 2016-07-12 DIAGNOSIS — E669 Obesity, unspecified: Secondary | ICD-10-CM | POA: Insufficient documentation

## 2016-07-12 DIAGNOSIS — I2510 Atherosclerotic heart disease of native coronary artery without angina pectoris: Secondary | ICD-10-CM | POA: Insufficient documentation

## 2016-07-12 DIAGNOSIS — I10 Essential (primary) hypertension: Secondary | ICD-10-CM

## 2016-07-12 DIAGNOSIS — Z7982 Long term (current) use of aspirin: Secondary | ICD-10-CM | POA: Insufficient documentation

## 2016-07-12 DIAGNOSIS — Z6834 Body mass index (BMI) 34.0-34.9, adult: Secondary | ICD-10-CM | POA: Insufficient documentation

## 2016-07-12 DIAGNOSIS — Z8249 Family history of ischemic heart disease and other diseases of the circulatory system: Secondary | ICD-10-CM | POA: Insufficient documentation

## 2016-07-12 DIAGNOSIS — F17210 Nicotine dependence, cigarettes, uncomplicated: Secondary | ICD-10-CM | POA: Insufficient documentation

## 2016-07-12 HISTORY — PX: LEFT HEART CATH AND CORONARY ANGIOGRAPHY: CATH118249

## 2016-07-12 HISTORY — PX: ABDOMINAL AORTOGRAM: CATH118222

## 2016-07-12 SURGERY — LEFT HEART CATH AND CORONARY ANGIOGRAPHY
Anesthesia: LOCAL

## 2016-07-12 MED ORDER — SODIUM CHLORIDE 0.9% FLUSH: 3.0000 mL | INTRAVENOUS | Status: DC | PRN

## 2016-07-12 MED ORDER — VERAPAMIL HCL 2.5 MG/ML IV SOLN
INTRAVENOUS | Status: DC | PRN
  Administered 2016-07-12: 10 mL via INTRA_ARTERIAL

## 2016-07-12 MED ORDER — SODIUM CHLORIDE 0.9 % IV SOLN: 250.0000 mL | INTRAVENOUS | Status: DC | PRN

## 2016-07-12 MED ORDER — IOPAMIDOL (ISOVUE-370) INJECTION 76%
INTRAVENOUS | Status: AC
  Filled 2016-07-12: qty 100

## 2016-07-12 MED ORDER — LIDOCAINE HCL (PF) 1 % IJ SOLN
INTRAMUSCULAR | Status: DC | PRN
  Administered 2016-07-12: 2 mL

## 2016-07-12 MED ORDER — LIDOCAINE HCL (PF) 1 % IJ SOLN
INTRAMUSCULAR | Status: AC
  Filled 2016-07-12: qty 30

## 2016-07-12 MED ORDER — MIDAZOLAM HCL 2 MG/2ML IJ SOLN
INTRAMUSCULAR | Status: DC | PRN
  Administered 2016-07-12: 1 mg via INTRAVENOUS
  Administered 2016-07-12: 2 mg via INTRAVENOUS

## 2016-07-12 MED ORDER — SODIUM CHLORIDE 0.9 % IV SOLN: INTRAVENOUS | Status: DC

## 2016-07-12 MED ORDER — ASPIRIN 81 MG PO CHEW: 81.0000 mg | CHEWABLE_TABLET | ORAL | Status: DC

## 2016-07-12 MED ORDER — FENTANYL CITRATE (PF) 100 MCG/2ML IJ SOLN
INTRAMUSCULAR | Status: DC | PRN
  Administered 2016-07-12 (×2): 25 ug via INTRAVENOUS

## 2016-07-12 MED ORDER — FENTANYL CITRATE (PF) 100 MCG/2ML IJ SOLN
INTRAMUSCULAR | Status: AC
  Filled 2016-07-12: qty 2

## 2016-07-12 MED ORDER — HEPARIN SODIUM (PORCINE) 1000 UNIT/ML IJ SOLN
INTRAMUSCULAR | Status: DC | PRN
  Administered 2016-07-12: 4500 [IU] via INTRAVENOUS

## 2016-07-12 MED ORDER — HEPARIN SODIUM (PORCINE) 1000 UNIT/ML IJ SOLN
INTRAMUSCULAR | Status: AC
  Filled 2016-07-12: qty 1

## 2016-07-12 MED ORDER — HEPARIN (PORCINE) IN NACL 2-0.9 UNIT/ML-% IJ SOLN
INTRAMUSCULAR | Status: DC | PRN
  Administered 2016-07-12: 1000 mL

## 2016-07-12 MED ORDER — SODIUM CHLORIDE 0.9 % WEIGHT BASED INFUSION: 1.0000 mL/kg/h | INTRAVENOUS | Status: DC

## 2016-07-12 MED ORDER — VERAPAMIL HCL 2.5 MG/ML IV SOLN
INTRAVENOUS | Status: AC
  Filled 2016-07-12: qty 2

## 2016-07-12 MED ORDER — HEPARIN (PORCINE) IN NACL 2-0.9 UNIT/ML-% IJ SOLN
INTRAMUSCULAR | Status: AC
  Filled 2016-07-12: qty 1000

## 2016-07-12 MED ORDER — SODIUM CHLORIDE 0.9% FLUSH: 3.0000 mL | Freq: Two times a day (BID) | INTRAVENOUS | Status: DC

## 2016-07-12 MED ORDER — SODIUM CHLORIDE 0.9 % WEIGHT BASED INFUSION
3.0000 mL/kg/h | INTRAVENOUS | Status: AC
  Administered 2016-07-12: 3 mL/kg/h via INTRAVENOUS

## 2016-07-12 MED ORDER — MIDAZOLAM HCL 2 MG/2ML IJ SOLN
INTRAMUSCULAR | Status: AC
  Filled 2016-07-12: qty 2

## 2016-07-12 MED ORDER — IOPAMIDOL (ISOVUE-370) INJECTION 76%
INTRAVENOUS | Status: DC | PRN
  Administered 2016-07-12: 75 mL

## 2016-07-12 SURGICAL SUPPLY — 10 items
CATH 5FR JL3.5 JR4 ANG PIG MP (CATHETERS) ×1 IMPLANT
DEVICE RAD COMP TR BAND LRG (VASCULAR PRODUCTS) ×1 IMPLANT
GLIDESHEATH SLEND SS 6F .021 (SHEATH) ×1 IMPLANT
GUIDEWIRE INQWIRE 1.5J.035X260 (WIRE) IMPLANT
INQWIRE 1.5J .035X260CM (WIRE) ×4
KIT HEART LEFT (KITS) ×2 IMPLANT
PACK CARDIAC CATHETERIZATION (CUSTOM PROCEDURE TRAY) ×2 IMPLANT
SYR MEDRAD MARK V 150ML (SYRINGE) ×1 IMPLANT
TRANSDUCER W/STOPCOCK (MISCELLANEOUS) ×2 IMPLANT
TUBING CIL FLEX 10 FLL-RA (TUBING) ×2 IMPLANT

## 2016-07-12 NOTE — H&P (View-Only) (Signed)
Cardiology Office Note   Date:  07/05/2016   ID:  Charlene Allen, DOB 18-Feb-1956, MRN [id number]  PCP:  Minerva Ends, MD    No chief complaint on file. Chest pain   Wt Readings from Last 3 Encounters:  07/05/16 194 lb (88 kg)  06/18/16 198 lb 12.8 oz (90.2 kg)  01/19/16 200 lb 3.2 oz (90.8 kg)       History of Present Illness: Charlene Allen is a 61 y.o. female  Referred by Dr. Adrian Blackwater for chest pain, who has a h/o tobacco abuse who had a chest xray.  SHe was sent to t pulmonologist and her lungs were ok per her report.  Her heart was enlarged so she is sent here for further eval.    She has had several episodes of chest pain.  Most recent episode was yesterday after some stress.  It was a pressure on her chest going to the left arm.  She has other episodes that were more severe, including an episode a few months ago that woke her from sleep.    Walking up a hill is her most strenuous exercise and she has DOE and feels her heart pumping.    SHe is trying to quit smoking.    Mother had CAD in her late 43s.  No other siblings with heart trouble.      Past Medical History:  Diagnosis Date  . Hypertension Dx 2015  . Obesity   . PMB (postmenopausal bleeding)     Past Surgical History:  Procedure Laterality Date  . HAND SURGERY    . TUBAL LIGATION       Current Outpatient Prescriptions  Medication Sig Dispense Refill  . acetaminophen-codeine (TYLENOL #4) 300-60 MG tablet Take 1 tablet by mouth every 6 (six) hours as needed for moderate pain. 90 tablet 0  . albuterol (PROVENTIL HFA;VENTOLIN HFA) 108 (90 Base) MCG/ACT inhaler Inhale 2 puffs into the lungs every 6 (six) hours as needed for wheezing or shortness of breath. 1 Inhaler 3  . aspirin 81 MG tablet Take 1 tablet (81 mg total) by mouth daily. 90 tablet 3  . atorvastatin (LIPITOR) 20 MG tablet Take 1 tablet (20 mg total) by mouth daily. 90 tablet 3  . budesonide-formoterol (SYMBICORT) 80-4.5 MCG/ACT  inhaler Take 2 puffs first thing in am and then another 2 puffs about 12 hours later. 1 Inhaler 11  . diazepam (VALIUM) 5 MG tablet Take 1 tablet (5 mg total) by mouth every 12 (twelve) hours as needed for anxiety. 60 tablet 2  . hydrALAZINE (APRESOLINE) 25 MG tablet Take 1 tablet (25 mg total) by mouth 4 (four) times daily. 120 tablet 0  . ibuprofen (ADVIL,MOTRIN) 600 MG tablet Take 1 tablet (600 mg total) by mouth every 8 (eight) hours as needed. 60 tablet 2  . losartan (COZAAR) 100 MG tablet Take 1 tablet (100 mg total) by mouth daily. 90 tablet 3  . methocarbamol (ROBAXIN) 500 MG tablet Take 1 tablet (500 mg total) by mouth 2 (two) times daily. 20 tablet 0  . nicotine (NICODERM CQ - DOSED IN MG/24 HOURS) 21 mg/24hr patch Place 21 mg onto the skin daily.    . nitroGLYCERIN (NITROSTAT) 0.4 MG SL tablet Place 1 tablet (0.4 mg total) under the tongue every 5 (five) minutes as needed for chest pain. If you need more than 2 doses call 911 20 tablet 1   No current facility-administered medications for this visit.     Allergies:  Patient has no known allergies.    Social History:  The patient  reports that she has been smoking.  She has a 40.00 pack-year smoking history. She has never used smokeless tobacco. She reports that she does not drink alcohol or use drugs.   Family History:  The patient's family history includes COPD in her mother; Cancer (age of onset: 43) in her sister; Heart disease in her mother; Hypertension in her mother; Stroke in her maternal grandmother and mother.    ROS:  Please see the history of present illness.   Otherwise, review of systems are positive for chest pain.   All other systems are reviewed and negative.    PHYSICAL EXAM: VS:  BP (!) 144/80   Pulse 61   Resp 16   Ht 5\' 3"  (1.6 m)   Wt 194 lb (88 kg)   BMI 34.37 kg/m  , BMI Body mass index is 34.37 kg/m. GEN: Well nourished, well developed, in no acute distress , smells of cigarettes HEENT: normal    Neck: no JVD, carotid bruits, or masses Cardiac: RRR; no murmurs, rubs, or gallops,no edema , 2+ PT pulses bilaterally Respiratory:  clear to auscultation bilaterally, normal work of breathing GI: soft, nontender, nondistended, + BS MS: no deformity or atrophy  Skin: warm and dry, no rash Neuro:  Strength and sensation are intact Psych: euthymic mood, full affect   EKG:   The ekg ordered today demonstrates normal sinus rhythm, nonspecific changes in the anterior leads   Recent Labs: 08/12/2015: ALT 18; BUN 9; Creat 0.84; Hemoglobin 14.6; Magnesium 2.0; Platelets 341; Potassium 3.8; Sodium 140   Lipid Panel    Component Value Date/Time   CHOL 197 08/12/2015 1145   TRIG 218 (H) 08/12/2015 1145   HDL 42 (L) 08/12/2015 1145   CHOLHDL 4.7 08/12/2015 1145   VLDL 44 (H) 08/12/2015 1145   LDLCALC 111 08/12/2015 1145     Other studies Reviewed: Additional studies/ records that were reviewed today with results demonstrating: Echocardiogram from 2017 showing normal left ventricular function with LVH.     ASSESSMENT AND PLAN:  1. Chest pain: several typical features.  Will start long acting nitrates, Imdur 30 mg daily. The risks benefits cardiac catheterization were explained the patient and she is willing to proceed.  Depending on her coronary anatomy, her LDL target may change.  All questions answered. 2. HTN: Borderline control today. Continue current medicines. 3. Tobacco abuse: I stressed the importance of stopping smoking. She is trying a nicotine patch intermittently to help stop smoking.   Current medicines are reviewed at length with the patient today.  The patient concerns regarding her medicines were addressed.  The following changes have been made:  Start isosorbide  Labs/ tests ordered today include: No orders of the defined types were placed in this encounter.   Recommend 150 minutes/week of aerobic exercise Low fat, low carb, high fiber diet  recommended  Disposition:   FU in post cath   Signed, Larae Grooms, MD  07/05/2016 9:50 AM    [facility] [place], [place], [place]  [address] Phone: [phone]; Fax: [phone]

## 2016-07-12 NOTE — Discharge Instructions (Signed)
Radial Site Care °Refer to this sheet in the next few weeks. These instructions provide you with information about caring for yourself after your procedure. Your health care provider may also give you more specific instructions. Your treatment has been planned according to current medical practices, but problems sometimes occur. Call your health care provider if you have any problems or questions after your procedure. °What can I expect after the procedure? °After your procedure, it is typical to have the following: °· Bruising at the radial site that usually fades within 1-2 weeks. °· Blood collecting in the tissue (hematoma) that may be painful to the touch. It should usually decrease in size and tenderness within 1-2 weeks. °Follow these instructions at home: °· Take medicines only as directed by your health care provider. °· You may shower 24-48 hours after the procedure or as directed by your health care provider. Remove the bandage (dressing) and gently wash the site with plain soap and water. Pat the area dry with a clean towel. Do not rub the site, because this may cause bleeding. °· Do not take baths, swim, or use a hot tub until your health care provider approves. °· Check your insertion site every day for redness, swelling, or drainage. °· Do not apply powder or lotion to the site. °· Do not flex or bend the affected arm for 24 hours or as directed by your health care provider. °· Do not push or pull heavy objects with the affected arm for 24 hours or as directed by your health care provider. °· Do not lift over 10 lb (4.5 kg) for 5 days after your procedure or as directed by your health care provider. °· Ask your health care provider when it is okay to: °¨ Return to work or school. °¨ Resume usual physical activities or sports. °¨ Resume sexual activity. °· Do not drive home if you are discharged the same day as the procedure. Have someone else drive you. °· You may drive 24 hours after the procedure  unless otherwise instructed by your health care provider. °· Do not operate machinery or power tools for 24 hours after the procedure. °· If your procedure was done as an outpatient procedure, which means that you went home the same day as your procedure, a responsible adult should be with you for the first 24 hours after you arrive home. °· Keep all follow-up visits as directed by your health care provider. This is important. °Contact a health care provider if: °· You have a fever. °· You have chills. °· You have increased bleeding from the radial site. Hold pressure on the site. °Get help right away if: °· You have unusual pain at the radial site. °· You have redness, warmth, or swelling at the radial site. °· You have drainage (other than a small amount of blood on the dressing) from the radial site. °· The radial site is bleeding, and the bleeding does not stop after 30 minutes of holding steady pressure on the site. °· Your arm or hand becomes pale, cool, tingly, or numb. °This information is not intended to replace advice given to you by your health care provider. Make sure you discuss any questions you have with your health care provider. °Document Released: 04/17/2010 Document Revised: 08/21/2015 Document Reviewed: 10/01/2013 °Elsevier Interactive Patient Education © 2017 [employer] ° °

## 2016-07-12 NOTE — Interval H&P Note (Signed)
Cath Lab Visit (complete for each Cath Lab visit)  Clinical Evaluation Leading to the Procedure:   ACS: No.  Non-ACS:    Anginal Classification: CCS III  Anti-ischemic medical therapy: Minimal Therapy (1 class of medications)  Non-Invasive Test Results: No non-invasive testing performed  Prior CABG: No previous CABG      History and Physical Interval Note:  07/12/2016 10:22 AM  Charlene Allen  has presented today for surgery, with the diagnosis of cp  The various methods of treatment have been discussed with the patient and family. After consideration of risks, benefits and other options for treatment, the patient has consented to  Procedure(s): Left Heart Cath and Coronary Angiography (N/A) as a surgical intervention .  The patient's history has been reviewed, patient examined, no change in status, stable for surgery.  I have reviewed the patient's chart and labs.  Questions were answered to the patient's satisfaction.     Larae Grooms

## 2016-07-13 ENCOUNTER — Other Ambulatory Visit: Payer: Self-pay | Admitting: Family Medicine

## 2016-07-13 DIAGNOSIS — G8929 Other chronic pain: Secondary | ICD-10-CM

## 2016-07-13 DIAGNOSIS — M545 Low back pain: Principal | ICD-10-CM

## 2016-07-13 DIAGNOSIS — M542 Cervicalgia: Secondary | ICD-10-CM

## 2016-07-13 MED ORDER — DIAZEPAM 5 MG PO TABS: 5.0000 mg | ORAL_TABLET | Freq: Two times a day (BID) | ORAL | 0 refills | Status: DC | PRN

## 2016-07-13 NOTE — Telephone Encounter (Signed)
Valium ready for pick up Patient will need OV for additonal refills Last visit in 11/2015

## 2016-07-14 MED FILL — diazePAM 5 MG TABS: 5 | 30 days supply | Qty: 60 | Fill #0

## 2016-07-14 NOTE — Telephone Encounter (Signed)
Pt picked up script on 07/14/16

## 2016-07-22 ENCOUNTER — Encounter: Payer: Self-pay | Admitting: Family Medicine

## 2016-07-22 ENCOUNTER — Other Ambulatory Visit: Payer: Self-pay

## 2016-07-22 ENCOUNTER — Ambulatory Visit: Payer: No Typology Code available for payment source | Attending: Family Medicine | Admitting: Family Medicine

## 2016-07-22 VITALS — BP 159/78 | HR 65 | Temp 98.2°F | Ht 63.0 in | Wt 194.4 lb

## 2016-07-22 DIAGNOSIS — M542 Cervicalgia: Secondary | ICD-10-CM | POA: Insufficient documentation

## 2016-07-22 DIAGNOSIS — F17210 Nicotine dependence, cigarettes, uncomplicated: Secondary | ICD-10-CM | POA: Insufficient documentation

## 2016-07-22 DIAGNOSIS — M25511 Pain in right shoulder: Secondary | ICD-10-CM | POA: Insufficient documentation

## 2016-07-22 DIAGNOSIS — Z0000 Encounter for general adult medical examination without abnormal findings: Secondary | ICD-10-CM

## 2016-07-22 DIAGNOSIS — M5442 Lumbago with sciatica, left side: Secondary | ICD-10-CM | POA: Insufficient documentation

## 2016-07-22 DIAGNOSIS — I10 Essential (primary) hypertension: Secondary | ICD-10-CM

## 2016-07-22 DIAGNOSIS — M5441 Lumbago with sciatica, right side: Secondary | ICD-10-CM

## 2016-07-22 DIAGNOSIS — Z7982 Long term (current) use of aspirin: Secondary | ICD-10-CM | POA: Insufficient documentation

## 2016-07-22 DIAGNOSIS — G8929 Other chronic pain: Secondary | ICD-10-CM | POA: Insufficient documentation

## 2016-07-22 MED ORDER — ACETAMINOPHEN-CODEINE #3 300-30 MG PO TABS: 2.0000 | ORAL_TABLET | Freq: Three times a day (TID) | ORAL | 0 refills | Status: DC | PRN

## 2016-07-22 MED ORDER — HYDRALAZINE HCL 50 MG PO TABS: 50.0000 mg | ORAL_TABLET | Freq: Four times a day (QID) | ORAL | 2 refills | Status: DC

## 2016-07-22 MED ORDER — NICOTINE 14 MG/24HR TD PT24: 14.0000 mg | MEDICATED_PATCH | Freq: Every day | TRANSDERMAL | 0 refills | Status: DC

## 2016-07-22 MED FILL — ISOSORBIDE MN ER 30 MG TAB: 30 | 30 days supply | Qty: 30 | Fill #0

## 2016-07-22 MED FILL — hydrALAZINE HCL 50 MG TABS: 50 | 30 days supply | Qty: 120 | Fill #0

## 2016-07-22 MED FILL — ACETAMINOPHEN/COD #3 TABLET: 300-30 | 30 days supply | Qty: 180 | Fill #0

## 2016-07-22 NOTE — Assessment & Plan Note (Signed)
chronic pain suspect tendinopathy Sports medicine referral placed for possible [place] and steroid injections Tylenol #3 for pain

## 2016-07-22 NOTE — Assessment & Plan Note (Signed)
A; HTN in smoker with mild CAD P: Increase hydralazine to 50 mg 4 times daily for better BP control

## 2016-07-22 NOTE — Progress Notes (Signed)
Subjective:  Patient ID: Charlene Allen, female    DOB: 17-Sep-1955  Age: 61 y.o. MRN: [id number]  CC: Back Pain and Neck Pain   HPI Charlene Allen has HTN, diastolic dysfunction, smoker she presents for   1. Chronic back pain: chronic cervical and lumbar pain. Worsening. 8/10.  Pain is worse in back than neck. She also has R shoulder pain that is non-radiating but exacerbated by lying on her R side or raising her R arm. No weakness in legs. No fecal or urinary incontinence. She is taking Tylenol #4 4 times a day and elavil she decreased dose to 37.5 mg nightlyare helping with pain, but not fully controlling pain symptoms. She request vicodin, she has been referred to pain management. Pain management recommend lumbar injections. She reports she cannot afford tylenol #4 any longer ($35/month).  She is a smoker. She denies dysuria, hematuria, frequency and urgency. She refuses tramadol as she has significant nausea with tramadol.   02/02/2016 lumbar MRI IIMPRESSION: 1. Stable compared to 2015.  No high-grade/compressive stenosis. 2. Advanced facet arthropathy at L3-4 and L4-5 with chronic grade 1 anterolisthesis at L4-5. 3. 9 mm endometrial stripe, unexpected for age but stable from 19. History of postmenopausal bleeding workup per ultrasound 10/11/2013, please ensure patient has appropriate gynecologic follow-up..  02/02/2016 cervical MRI IMPRESSION: 1. Advanced degenerative disc disease and right C3-4 facet arthropathy that has progressed from 2009. 2. 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 Bilateral foraminal impingement at the same levels, particularly severe at C3-4.   2. HTN: has slight HA today. Compliant with medication. She reports an episode of severe chest pain 5 days ago. Pain started at rest while sitting on the side of her bed. She describes pressure and feeling like she was punched in the chest.  Pain lasted for 10 minutes. She has  associated shortness of breath. She took a nitroglycerin. She has had slight chest pain since the that last a few seconds. Has intermittent cough. Smoking 1 PPD.   3. Smoking: desires to quit. She is taking 21 mg nicotine patch.    4. Healthcare maintenance: she has hx of C-scope x 2 in 30s due to abdominal pain, r/o Chrohns, normal scopes.   Social History  Substance Use Topics  . Smoking status: Current Every Day Smoker    Packs/day: 1.00    Years: 40.00  . Smokeless tobacco: Never Used  . Alcohol use No    Outpatient Medications Prior to Visit  Medication Sig Dispense Refill  . aspirin 81 MG tablet Take 1 tablet (81 mg total) by mouth daily. 90 tablet 3  . atorvastatin (LIPITOR) 20 MG tablet Take 1 tablet (20 mg total) by mouth daily. 90 tablet 3  . budesonide-formoterol (SYMBICORT) 80-4.5 MCG/ACT inhaler Take 2 puffs first thing in am and then another 2 puffs about 12 hours later. (Patient taking differently: Inhale 1 puff into the lungs 4 (four) times daily. ) 1 Inhaler 11  . diazepam (VALIUM) 5 MG tablet Take 1 tablet (5 mg total) by mouth every 12 (twelve) hours as needed for anxiety. 60 tablet 0  . hydrALAZINE (APRESOLINE) 25 MG tablet Take 1 tablet (25 mg total) by mouth 4 (four) times daily. 120 tablet 0  . ibuprofen (ADVIL,MOTRIN) 200 MG tablet Take 400 mg by mouth daily as needed for moderate pain.    . isosorbide mononitrate (IMDUR) 30 MG 24 hr tablet Take 1 tablet (30 mg total) by mouth daily. 30 tablet 11  .  losartan (COZAAR) 100 MG tablet Take 1 tablet (100 mg total) by mouth daily. 90 tablet 3  . Multiple Vitamin (MULTIVITAMIN WITH MINERALS) TABS tablet Take 1 tablet by mouth daily.    . nicotine (NICODERM CQ - DOSED IN MG/24 HOURS) 21 mg/24hr patch Place 21 mg onto the skin daily.    . nitroGLYCERIN (NITROSTAT) 0.4 MG SL tablet Place 1 tablet (0.4 mg total) under the tongue every 5 (five) minutes as needed for chest pain. If you need more than 2 doses call 911 20 tablet  1  . acetaminophen-codeine (TYLENOL #4) 300-60 MG tablet Take 1 tablet by mouth every 6 (six) hours as needed for moderate pain. 90 tablet 0  . albuterol (PROVENTIL HFA;VENTOLIN HFA) 108 (90 Base) MCG/ACT inhaler Inhale 2 puffs into the lungs every 6 (six) hours as needed for wheezing or shortness of breath. (Patient not taking: Reported on 07/22/2016) 1 Inhaler 3   No facility-administered medications prior to visit.     ROS Review of Systems  Constitutional: Negative for chills and fever.  HENT: Negative for dental problem.   Eyes: Negative for visual disturbance.  Respiratory: Negative for cough and shortness of breath.   Cardiovascular: Negative for chest pain.  Gastrointestinal: Negative for abdominal distention, abdominal pain, anal bleeding, blood in stool, constipation, diarrhea, nausea, rectal pain and vomiting.  Musculoskeletal: Positive for arthralgias (R shoulder ), back pain and neck pain.  Skin: Negative for rash.  Allergic/Immunologic: Negative for immunocompromised state.  Neurological: Positive for headaches.  Hematological: Negative for adenopathy. Does not bruise/bleed easily.  Psychiatric/Behavioral: Negative for dysphoric mood and suicidal ideas.    Objective:  BP (!) 159/78   Pulse 65   Temp 98.2 F (36.8 C) (Oral)   Ht 5\' 3"  (1.6 m)   Wt 194 lb 6.4 oz (88.2 kg)   SpO2 98%   BMI 34.44 kg/m   BP/Weight 07/22/2016 04/22/5807 12/04/3380  Systolic BP 505 397 673  Diastolic BP 78 80 80  Wt. (Lbs) 194.4 194 194  BMI 34.44 34.37 34.37   Physical Exam  Constitutional: She is oriented to person, place, and time. She appears well-developed and well-nourished. No distress.  HENT:  Head: Normocephalic and atraumatic.  Mouth/Throat: Oropharynx is clear and moist and mucous membranes are normal.  Cardiovascular: Normal rate, regular rhythm, normal heart sounds and intact distal pulses.   Pulmonary/Chest: Effort normal. She has rhonchi.  Musculoskeletal: She exhibits  no edema.       Right shoulder: She exhibits decreased range of motion, tenderness, pain (R lateral and anterior shoulder ) and spasm. She exhibits no bony tenderness, no swelling, no effusion, no crepitus, no deformity, no laceration, normal pulse and normal strength.     Neurological: She is alert and oriented to person, place, and time.  Skin: Skin is warm and dry. No rash noted.  Psychiatric: She has a normal mood and affect.    EKG: normal EKG, normal sinus rhythm, unchanged from previous tracings. Assessment & Plan:  Heidemarie was seen today for back pain and neck pain.  Diagnoses and all orders for this visit:  Healthcare maintenance -     Ambulatory referral to Gastroenterology  Essential hypertension, benign -     hydrALAZINE (APRESOLINE) 50 MG tablet; Take 1 tablet (50 mg total) by mouth 4 (four) times daily.  Chronic bilateral low back pain with bilateral sciatica -     acetaminophen-codeine (TYLENOL #3) 300-30 MG tablet; Take 2 tablets by mouth every 8 (eight) hours as  needed.  Cigarette smoker -     nicotine (NICODERM CQ - DOSED IN MG/24 HOURS) 14 mg/24hr patch; Place 1 patch (14 mg total) onto the skin daily.  Chronic right shoulder pain -     Ambulatory referral to Sports Medicine   There are no diagnoses linked to this encounter. There are no diagnoses linked to this encounter. No orders of the defined types were placed in this encounter.   Follow-up: Return in about 3 weeks (around 08/12/2016) for pap smear.   Boykin Nearing MD

## 2016-07-22 NOTE — Assessment & Plan Note (Signed)
Taper down to 14 mg patch

## 2016-07-22 NOTE — Patient Instructions (Addendum)
  Reighan was seen today for back pain and neck pain.  Diagnoses and all orders for this visit:  Healthcare maintenance -     Ambulatory referral to Gastroenterology  Essential hypertension, benign -     hydrALAZINE (APRESOLINE) 50 MG tablet; Take 1 tablet (50 mg total) by mouth 4 (four) times daily.  Chronic bilateral low back pain with bilateral sciatica -     acetaminophen-codeine (TYLENOL #3) 300-30 MG tablet; Take 2 tablets by mouth every 8 (eight) hours as needed.  Cigarette smoker -     nicotine (NICODERM CQ - DOSED IN MG/24 HOURS) 14 mg/24hr patch; Place 1 patch (14 mg total) onto the skin daily.  Chronic right shoulder pain -     Ambulatory referral to Sports Medicine   Call quit now to inquire about 14 mg patch  Please call Rolena Infante, [phone],  with the BCCCP (breast and cervical cancer control program) at the [facility] to set up an appointment to verify eligibility for a breast exam, mammogram, ultrasound. If you qualify this will be set up at [facility].  Please return in 2-3 weeks for pap smear  Dr. Adrian Blackwater

## 2016-07-22 NOTE — Assessment & Plan Note (Signed)
chronic pain lumbar injections Transitioned back to tylenol #3 due to cost

## 2016-07-29 ENCOUNTER — Ambulatory Visit (HOSPITAL_BASED_OUTPATIENT_CLINIC_OR_DEPARTMENT_OTHER): Payer: No Typology Code available for payment source | Admitting: Physical Medicine & Rehabilitation

## 2016-07-29 ENCOUNTER — Encounter: Payer: Self-pay | Admitting: Physical Medicine & Rehabilitation

## 2016-07-29 ENCOUNTER — Encounter: Payer: No Typology Code available for payment source | Attending: Physical Medicine & Rehabilitation

## 2016-07-29 DIAGNOSIS — M47896 Other spondylosis, lumbar region: Secondary | ICD-10-CM | POA: Insufficient documentation

## 2016-07-29 DIAGNOSIS — M542 Cervicalgia: Secondary | ICD-10-CM | POA: Insufficient documentation

## 2016-07-29 DIAGNOSIS — M5382 Other specified dorsopathies, cervical region: Secondary | ICD-10-CM | POA: Insufficient documentation

## 2016-07-29 DIAGNOSIS — M5412 Radiculopathy, cervical region: Secondary | ICD-10-CM | POA: Insufficient documentation

## 2016-07-29 DIAGNOSIS — M545 Low back pain: Secondary | ICD-10-CM | POA: Insufficient documentation

## 2016-07-29 DIAGNOSIS — G8929 Other chronic pain: Secondary | ICD-10-CM | POA: Insufficient documentation

## 2016-07-29 DIAGNOSIS — M47816 Spondylosis without myelopathy or radiculopathy, lumbar region: Secondary | ICD-10-CM

## 2016-07-29 NOTE — Progress Notes (Signed)
Bilateral Lumbar , L4  medial branch blocks and L 5 dorsal ramus injection under fluoroscopic guidance  Indication: Lumbar pain which is not relieved by medication management or other conservative care and interfering with self-care and mobility.  Informed consent was obtained after describing risks and benefits of the procedure with the patient, this includes bleeding, infection, paralysis and medication side effects.  The patient wishes to proceed and has given written consent.  The patient was placed in prone position.  The lumbar area was marked and prepped with Betadine.  One mL of 1% lidocaine was injected into each of 4 areas into the skin and subcutaneous tissue.  Then a 22-gauge 3.5in spinal needle was inserted targeting the junction of the left S1 superior articular process and sacral ala junction. Needle was advanced under fluoroscopic guidance.  Bone contact was made.  Isovue 200 was injected x 0.5 mL demonstrating no intravascular uptake.  Then a solution  of 2% MPF lidocaine was injected x 0.5 mL.  Then the left L5 superior articular process in transverse process junction was targeted.  Bone contact was made.  Isovue 200 was injected x 0.5 mL demonstrating no intravascular uptake. Then a solution containing  2% MPF lidocaine was injected x 0.5 mL.    This same procedure was performed on the right side using the same needle, technique and injectate.  Patient tolerated procedure well.  Post procedure instructions were given. Post procedure pain improved in low back feels pain in upper lumbar now

## 2016-07-29 NOTE — Progress Notes (Signed)
  PROCEDURE RECORD [facility]   Name: Charlene Allen DOB:Oct 19, 1955 MRN: [id number]  Date:07/29/2016  Physician: Alysia Penna, MD    Nurse/CMA: Shmuel Girgis, CMA  Allergies: No Known Allergies  Consent Signed: Yes.    Is patient diabetic? No.  CBG today? n/a  Pregnant: No. LMP: No LMP recorded. Patient is postmenopausal. (age 60-55)  Anticoagulants: no Anti-inflammatory: no Antibiotics: no  Procedure: bilateral medial branch block Position: Prone Start Time: 9:51am  End Time: 10:05 am  Fluoro Time: 54  RN/CMA Camren Lipsett, CMA Praneeth Bussey, CMA    Time 9:33am 10:07am    BP 120/77 138/78    Pulse 68 77    Respirations 14 14    O2 Sat 92 963/10    S/S 6 6    Pain Level 8/10      D/C home with daughter, patient A & O X 3, D/C instructions reviewed, and sits independently.

## 2016-07-30 ENCOUNTER — Ambulatory Visit: Payer: No Typology Code available for payment source | Admitting: Internal Medicine

## 2016-08-09 ENCOUNTER — Telehealth: Payer: Self-pay | Admitting: Family Medicine

## 2016-08-09 DIAGNOSIS — G8929 Other chronic pain: Secondary | ICD-10-CM

## 2016-08-09 DIAGNOSIS — M5441 Lumbago with sciatica, right side: Principal | ICD-10-CM

## 2016-08-09 DIAGNOSIS — M5442 Lumbago with sciatica, left side: Principal | ICD-10-CM

## 2016-08-09 NOTE — Telephone Encounter (Signed)
This will have to be addressed by PCP.

## 2016-08-09 NOTE — Telephone Encounter (Signed)
PT called to request to be change back to TYLENOL #4 since the Tylenol #3 is not working, please let her know if you can auth. Please follow up with PT

## 2016-08-10 MED ORDER — ACETAMINOPHEN-CODEINE #3 300-30 MG PO TABS: 2.0000 | ORAL_TABLET | Freq: Three times a day (TID) | ORAL | 0 refills | Status: DC | PRN

## 2016-08-10 MED ORDER — ACETAMINOPHEN-CODEINE #4 300-60 MG PO TABS: 1.0000 | ORAL_TABLET | Freq: Three times a day (TID) | ORAL | 0 refills | Status: DC | PRN

## 2016-08-10 NOTE — Telephone Encounter (Signed)
Please call patient I will be able to refill tylenol #4 at f/u it is too soon now as it has not been 30 days since she filled tylenol #3  She is schedule for pap smear on 08/16/16

## 2016-08-11 ENCOUNTER — Encounter: Payer: Self-pay | Admitting: Family Medicine

## 2016-08-11 ENCOUNTER — Ambulatory Visit: Payer: Self-pay | Admitting: Family Medicine

## 2016-08-11 NOTE — Telephone Encounter (Signed)
Pt was called and informed of refill being refused , pt understands that script will be filled on OV 08/16/16

## 2016-08-13 MED FILL — LOSARTAN POTASSIUM 100 MG T: 100 | 30 days supply | Qty: 30 | Fill #6

## 2016-08-16 ENCOUNTER — Encounter: Payer: Self-pay | Admitting: Family Medicine

## 2016-08-16 ENCOUNTER — Ambulatory Visit: Payer: No Typology Code available for payment source | Attending: Family Medicine | Admitting: Family Medicine

## 2016-08-16 ENCOUNTER — Other Ambulatory Visit (HOSPITAL_COMMUNITY)
Admission: RE | Admit: 2016-08-16 | Discharge: 2016-08-16 | Disposition: A | Discharge status: Home / Self Care | Payer: No Typology Code available for payment source | Source: Ambulatory Visit | Attending: Family Medicine | Admitting: Family Medicine

## 2016-08-16 VITALS — BP 135/74 | HR 69 | Temp 98.2°F | Ht 63.0 in | Wt 193.6 lb

## 2016-08-16 DIAGNOSIS — J449 Chronic obstructive pulmonary disease, unspecified: Secondary | ICD-10-CM | POA: Insufficient documentation

## 2016-08-16 DIAGNOSIS — M5441 Lumbago with sciatica, right side: Secondary | ICD-10-CM

## 2016-08-16 DIAGNOSIS — G8929 Other chronic pain: Secondary | ICD-10-CM

## 2016-08-16 DIAGNOSIS — Z1231 Encounter for screening mammogram for malignant neoplasm of breast: Secondary | ICD-10-CM

## 2016-08-16 DIAGNOSIS — Z124 Encounter for screening for malignant neoplasm of cervix: Secondary | ICD-10-CM | POA: Insufficient documentation

## 2016-08-16 DIAGNOSIS — M542 Cervicalgia: Secondary | ICD-10-CM

## 2016-08-16 DIAGNOSIS — F17210 Nicotine dependence, cigarettes, uncomplicated: Secondary | ICD-10-CM | POA: Insufficient documentation

## 2016-08-16 DIAGNOSIS — Z01419 Encounter for gynecological examination (general) (routine) without abnormal findings: Secondary | ICD-10-CM | POA: Insufficient documentation

## 2016-08-16 DIAGNOSIS — M5442 Lumbago with sciatica, left side: Secondary | ICD-10-CM

## 2016-08-16 DIAGNOSIS — Z0000 Encounter for general adult medical examination without abnormal findings: Secondary | ICD-10-CM

## 2016-08-16 DIAGNOSIS — F419 Anxiety disorder, unspecified: Secondary | ICD-10-CM | POA: Insufficient documentation

## 2016-08-16 MED ORDER — METHOCARBAMOL 750 MG PO TABS: 750.0000 mg | ORAL_TABLET | Freq: Three times a day (TID) | ORAL | 0 refills | Status: DC | PRN

## 2016-08-16 MED ORDER — ACETAMINOPHEN-CODEINE #4 300-60 MG PO TABS: 1.0000 | ORAL_TABLET | Freq: Four times a day (QID) | ORAL | 0 refills | Status: DC | PRN

## 2016-08-16 MED ORDER — HYDROCODONE-ACETAMINOPHEN 5-325 MG PO TABS: 1.0000 | ORAL_TABLET | Freq: Three times a day (TID) | ORAL | 0 refills | Status: DC | PRN

## 2016-08-16 MED ORDER — PREDNISONE 20 MG PO TABS: ORAL_TABLET | ORAL | 0 refills | Status: DC

## 2016-08-16 MED FILL — METHOCARBAMOL 750 MG TABLET: 750 | 7 days supply | Qty: 21 | Fill #0

## 2016-08-16 MED FILL — HYDROCODON-APAP 5-325: 5-325 | 5 days supply | Qty: 15 | Fill #0

## 2016-08-16 NOTE — Patient Instructions (Addendum)
Skie was seen today for neck pain and back pain.  Diagnoses and all orders for this visit:  Pap smear for cervical cancer screening -     Cytology - PAP  Chronic neck pain -     acetaminophen-codeine (TYLENOL #4) 300-60 MG tablet; Take 1 tablet by mouth every 6 (six) hours as needed for moderate pain. Fill on 08/21/16  Chronic bilateral low back pain with bilateral sciatica -     acetaminophen-codeine (TYLENOL #4) 300-60 MG tablet; Take 1 tablet by mouth every 6 (six) hours as needed for moderate pain. Fill on 08/21/16  Acute neck pain -     predniSONE (DELTASONE) 20 MG tablet; Take every morning with food 60 mg daily for 3 days, 40 mg daily for 3 days, 30 mg daily for 3 days, 20 mg daily for 3 days, 10 mg daily for 3 days then STOP -     methocarbamol (ROBAXIN-750) 750 MG tablet; Take 1 tablet (750 mg total) by mouth every 8 (eight) hours as needed for muscle spasms. -     HYDROcodone-acetaminophen (NORCO/VICODIN) 5-325 MG tablet; Take 1 tablet by mouth every 8 (eight) hours as needed for severe pain.  Visit for screening mammogram -     MM DIGITAL SCREENING BILATERAL; Future  Healthcare maintenance -     Ambulatory referral to Gastroenterology   You are also due for mammogram and colonoscopy  GI referral placed for colonoscopy  Please call Rolena Infante, [phone],  with the BCCCP (breast and cervical cancer control program) at the [facility] to set up an appointment to verify eligibility for a breast exam, mammogram, ultrasound. If you qualify this will be set up at [place] from tylenol #3 back to tylenol #4 for chronic pain, fill date if 08/21/16 Short course 5 days of vicodin for acute neck beck along with prednisone taper and robaxin   You will be called with pap results   F/u in 10 -14 days weeks for neck pain and headache   Dr. Adrian Blackwater

## 2016-08-16 NOTE — Progress Notes (Signed)
Subjective:  Patient ID: Charlene Allen, female    DOB: [birth year]  Age: 61 y.o. MRN: [id number]  CC: Gynecologic Exam   HPI Charlene Allen has [facility], chronic pain ain back and neck, COPD continues to smoke  she presents for    1. 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 She is a smoker.  She is also due for screening mammogram and colonoscopy. She is uninsured but has the [facility]) card.    2. Chronic neck and back pain: she has chronic pain. She takes tylenol #3 2 tabs every 8 hrs. Valium 5 mg  BID for anxiety which also serves as a muscle relaxer. She is treated by pain management. She has had lumbar spine injections on 07/30/16. She reports worsening of her neck pain starting 07/31/16. R sided. Throbbing headache as well. No R sided neck stiffness and soreness. No fever, chills, nausea, vomiting, sensitivity to light or sound. She request Rx for tylenol #4 stating that it was better pain control. She reports her current neck pain is 9/10. Radiating to shoulder but not down R arm.   Social History  Substance Use Topics  . Smoking status: Current Every Day Smoker    Packs/day: 1.00    Years: 40.00  . Smokeless tobacco: Never Used  . Alcohol use No    Outpatient Medications Prior to Visit  Medication Sig Dispense Refill  . acetaminophen-codeine (TYLENOL #3) 300-30 MG tablet Take 2 tablets by mouth every 8 (eight) hours as needed. 180 tablet 0  . albuterol (PROVENTIL HFA;VENTOLIN HFA) 108 (90 Base) MCG/ACT inhaler Inhale 2 puffs into the lungs every 6 (six) hours as needed for wheezing or shortness of breath. 1 Inhaler 3  . aspirin 81 MG tablet Take 1 tablet (81 mg total) by mouth daily. 90 tablet 3  . atorvastatin (LIPITOR) 20 MG tablet Take 1 tablet (20 mg total) by mouth daily. 90 tablet 3  . budesonide-formoterol (SYMBICORT) 80-4.5 MCG/ACT inhaler Take 2 puffs first thing in am and then another 2 puffs about 12 hours later.  (Patient taking differently: Inhale 1 puff into the lungs 4 (four) times daily. ) 1 Inhaler 11  . diazepam (VALIUM) 5 MG tablet Take 1 tablet (5 mg total) by mouth every 12 (twelve) hours as needed for anxiety. 60 tablet 0  . hydrALAZINE (APRESOLINE) 50 MG tablet Take 1 tablet (50 mg total) by mouth 4 (four) times daily. 120 tablet 2  . ibuprofen (ADVIL,MOTRIN) 200 MG tablet Take 400 mg by mouth daily as needed for moderate pain.    . isosorbide mononitrate (IMDUR) 30 MG 24 hr tablet Take 1 tablet (30 mg total) by mouth daily. 30 tablet 11  . losartan (COZAAR) 100 MG tablet Take 1 tablet (100 mg total) by mouth daily. 90 tablet 3  . Multiple Vitamin (MULTIVITAMIN WITH MINERALS) TABS tablet Take 1 tablet by mouth daily.    . nicotine (NICODERM CQ - DOSED IN MG/24 HOURS) 14 mg/24hr patch Place 1 patch (14 mg total) onto the skin daily. 28 patch 0  . nitroGLYCERIN (NITROSTAT) 0.4 MG SL tablet Place 1 tablet (0.4 mg total) under the tongue every 5 (five) minutes as needed for chest pain. If you need more than 2 doses call 911 20 tablet 1   No facility-administered medications prior to visit.     ROS Review of Systems  Constitutional: Negative for chills and fever.  HENT: Negative for dental problem.   Eyes:  Negative for visual disturbance.  Respiratory: Negative for cough and shortness of breath.   Cardiovascular: Negative for chest pain.  Gastrointestinal: Negative for abdominal distention, abdominal pain, anal bleeding, blood in stool, constipation, diarrhea, nausea, rectal pain and vomiting.  Musculoskeletal: Positive for arthralgias (R shoulder ), back pain and neck pain.  Skin: Negative for rash.  Allergic/Immunologic: Negative for immunocompromised state.  Neurological: Positive for headaches.  Hematological: Negative for adenopathy. Does not bruise/bleed easily.  Psychiatric/Behavioral: Negative for dysphoric mood and suicidal ideas.    Objective:  BP 135/74   Pulse 69   Temp 98.2  F (36.8 C) (Oral)   Ht 5\' 3"  (1.6 m)   Wt 193 lb 9.6 oz (87.8 kg)   SpO2 96%   BMI 34.29 kg/m   BP/Weight 07/29/2016 07/22/2016 3/41/9379  Systolic BP 024 097 353  Diastolic BP 77 78 80  Wt. (Lbs) - 194.4 194  BMI - 34.44 34.37    Physical Exam  Constitutional: She appears well-developed and well-nourished. No distress.  HENT:  Right Ear: Tympanic membrane and ear canal normal.  Left Ear: Tympanic membrane and ear canal normal.  Nose: Nose normal.  Mouth/Throat: Oropharynx is clear and moist.  Eyes: Conjunctivae and EOM are normal.  Neck: Muscular tenderness present. Neck rigidity present. Erythema and decreased range of motion present. No edema present.    Cardiovascular: Normal rate, regular rhythm, normal heart sounds and intact distal pulses.   Pulmonary/Chest: Effort normal and breath sounds normal.  Genitourinary: Vagina normal and uterus normal. Pelvic exam was performed with patient prone. There is no rash, tenderness or lesion on the right labia. There is no rash, tenderness or lesion on the left labia. Cervix exhibits no motion tenderness, no discharge and no friability.  Musculoskeletal: She exhibits no edema.  Lymphadenopathy:       Right: No inguinal adenopathy present.       Left: No inguinal adenopathy present.  Skin: Skin is warm and dry. No rash noted.     Assessment & Plan:  Charlene Allen was seen today for neck pain and back pain.  Diagnoses and all orders for this visit:  Pap smear for cervical cancer screening -     Cytology - PAP  Chronic neck pain -     acetaminophen-codeine (TYLENOL #4) 300-60 MG tablet; Take 1 tablet by mouth every 6 (six) hours as needed for moderate pain. Fill on 08/21/16  Chronic bilateral low back pain with bilateral sciatica -     acetaminophen-codeine (TYLENOL #4) 300-60 MG tablet; Take 1 tablet by mouth every 6 (six) hours as needed for moderate pain. Fill on 08/21/16  Acute neck pain -     predniSONE (DELTASONE) 20 MG tablet;  Take every morning with food 60 mg daily for 3 days, 40 mg daily for 3 days, 30 mg daily for 3 days, 20 mg daily for 3 days, 10 mg daily for 3 days then STOP -     methocarbamol (ROBAXIN-750) 750 MG tablet; Take 1 tablet (750 mg total) by mouth every 8 (eight) hours as needed for muscle spasms. -     HYDROcodone-acetaminophen (NORCO/VICODIN) 5-325 MG tablet; Take 1 tablet by mouth every 8 (eight) hours as needed for severe pain.  Visit for screening mammogram -     MM DIGITAL SCREENING BILATERAL; Future  Healthcare maintenance -     Ambulatory referral to Gastroenterology   There are no diagnoses linked to this encounter.  No orders of the defined types were placed  in this encounter.   Follow-up: No Follow-up on file.   Boykin Nearing MD

## 2016-08-16 NOTE — Assessment & Plan Note (Signed)
Acute flare of chronic neck pain with muslce spasm No weakness or radicular symptoms Plan: vicodin x 5 days course Robaxin x 7 days course Prednisone taper

## 2016-08-16 NOTE — Assessment & Plan Note (Signed)
Chronic pain with known arthritis Transition to tylenol #4 for chronic pain She has already been referred to neurosurgery and pain management Treatment options limited by lack of medical insurance

## 2016-08-17 LAB — CYTOLOGY - PAP
CANDIDA VAGINITIS: NEGATIVE
CHLAMYDIA, DNA PROBE: NEGATIVE
Diagnosis: NEGATIVE
HPV: NOT DETECTED
NEISSERIA GONORRHEA: NEGATIVE
TRICH (WINDOWPATH): NEGATIVE

## 2016-08-18 ENCOUNTER — Telehealth: Payer: Self-pay

## 2016-08-18 NOTE — Telephone Encounter (Signed)
Pt was called and informed of lab results. 

## 2016-08-24 ENCOUNTER — Encounter: Payer: Self-pay | Admitting: Family Medicine

## 2016-08-30 ENCOUNTER — Ambulatory Visit: Payer: No Typology Code available for payment source | Admitting: Physical Medicine & Rehabilitation

## 2016-08-31 ENCOUNTER — Ambulatory Visit: Payer: No Typology Code available for payment source | Admitting: Family Medicine

## 2016-09-07 ENCOUNTER — Encounter: Payer: Self-pay | Admitting: Family Medicine

## 2016-09-07 ENCOUNTER — Ambulatory Visit: Payer: Self-pay | Attending: Family Medicine | Admitting: Family Medicine

## 2016-09-07 VITALS — BP 139/79 | HR 66 | Temp 98.2°F | Wt 194.2 lb

## 2016-09-07 DIAGNOSIS — Z7982 Long term (current) use of aspirin: Secondary | ICD-10-CM | POA: Insufficient documentation

## 2016-09-07 DIAGNOSIS — R938 Abnormal findings on diagnostic imaging of other specified body structures: Secondary | ICD-10-CM | POA: Insufficient documentation

## 2016-09-07 DIAGNOSIS — G8929 Other chronic pain: Secondary | ICD-10-CM | POA: Insufficient documentation

## 2016-09-07 DIAGNOSIS — R9389 Abnormal findings on diagnostic imaging of other specified body structures: Secondary | ICD-10-CM

## 2016-09-07 DIAGNOSIS — I10 Essential (primary) hypertension: Secondary | ICD-10-CM | POA: Insufficient documentation

## 2016-09-07 DIAGNOSIS — M545 Low back pain: Secondary | ICD-10-CM | POA: Insufficient documentation

## 2016-09-07 DIAGNOSIS — F419 Anxiety disorder, unspecified: Secondary | ICD-10-CM | POA: Insufficient documentation

## 2016-09-07 DIAGNOSIS — M542 Cervicalgia: Secondary | ICD-10-CM | POA: Insufficient documentation

## 2016-09-07 DIAGNOSIS — F17210 Nicotine dependence, cigarettes, uncomplicated: Secondary | ICD-10-CM | POA: Insufficient documentation

## 2016-09-07 DIAGNOSIS — M4802 Spinal stenosis, cervical region: Secondary | ICD-10-CM | POA: Insufficient documentation

## 2016-09-07 MED ORDER — NORTRIPTYLINE HCL 25 MG PO CAPS: 25.0000 mg | ORAL_CAPSULE | Freq: Every day | ORAL | 1 refills | Status: DC

## 2016-09-07 MED ORDER — NORTRIPTYLINE HCL 25 MG PO CAPS: ORAL_CAPSULE | ORAL | 0 refills | Status: DC

## 2016-09-07 MED ORDER — DIAZEPAM 5 MG PO TABS: 5.0000 mg | ORAL_TABLET | Freq: Two times a day (BID) | ORAL | 0 refills | Status: DC | PRN

## 2016-09-07 MED FILL — NORTRIPTYLINE HCL 25 MG CAP: 25 | 22 days supply | Qty: 90 | Fill #0

## 2016-09-07 MED FILL — ISOSORBIDE MN ER 30 MG TAB: 30 | 30 days supply | Qty: 30 | Fill #1

## 2016-09-07 NOTE — Patient Instructions (Addendum)
Charlene Allen was seen today for neck pain.  Diagnoses and all orders for this visit:  Chronic neck pain -     diazepam (VALIUM) 5 MG tablet; Take 1 tablet (5 mg total) by mouth every 12 (twelve) hours as needed for anxiety. -     nortriptyline (PAMELOR) 25 MG capsule; Take 1 capsule (25 mg total) by mouth at bedtime.  Chronic low back pain, unspecified back pain laterality, with sciatica presence unspecified -     diazepam (VALIUM) 5 MG tablet; Take 1 tablet (5 mg total) by mouth every 12 (twelve) hours as needed for anxiety. -     nortriptyline (PAMELOR) 25 MG capsule; Take 1 capsule (25 mg total) by mouth at bedtime.   Taper up on pamelor from 25 mg nightly by 25 mg every week up to 100 mg nightly   F/u 3-4 weeks for chronic neck and back pain   Dr. Adrian Blackwater

## 2016-09-07 NOTE — Progress Notes (Deleted)
Subjective:  Patient ID: Charlene Allen, female    DOB: Mar 04, 1956  Age: 61 y.o. MRN: [id number]  CC: Neck Pain   HPI Aunisty Reali has HTN, chronic pain ain back and neck, COPD continues to smoke  she presents for    1. Chronic neck and back pain: she has chronic pain since 2004 . She takes tylenol #4 2 tabs every 8 hrs. Valium 5 mg  BID for anxiety which also serves as a muscle relaxer. She is treated by pain management. She has had lumbar spine injections on 07/30/16. She reports worsening of her neck pain starting 07/31/16. R sided. Throbbing headache as well. No R sided neck stiffness and soreness. No fever, chills, nausea, vomiting, sensitivity to light or sound. She request Rx for tylenol #4 stating that it was better pain control. She reports her current neck pain is 8/10. Radiating to shoulder but not down R arm.   She reports no improvement with recent prednisone taper or robaxin. She took a short course of Vicodin which helped. She knows that Vicodin cannot be refilled chronically.   Social History  Substance Use Topics  . Smoking status: Current Every Day Smoker    Packs/day: 1.00    Years: 40.00  . Smokeless tobacco: Never Used  . Alcohol use No    Outpatient Medications Prior to Visit  Medication Sig Dispense Refill  . acetaminophen-codeine (TYLENOL #4) 300-60 MG tablet Take 1 tablet by mouth every 6 (six) hours as needed for moderate pain. Fill on 08/21/16 120 tablet 0  . albuterol (PROVENTIL HFA;VENTOLIN HFA) 108 (90 Base) MCG/ACT inhaler Inhale 2 puffs into the lungs every 6 (six) hours as needed for wheezing or shortness of breath. 1 Inhaler 3  . aspirin 81 MG tablet Take 1 tablet (81 mg total) by mouth daily. 90 tablet 3  . atorvastatin (LIPITOR) 20 MG tablet Take 1 tablet (20 mg total) by mouth daily. 90 tablet 3  . budesonide-formoterol (SYMBICORT) 80-4.5 MCG/ACT inhaler Take 2 puffs first thing in am and then another 2 puffs about 12 hours later. (Patient taking  differently: Inhale 1 puff into the lungs 4 (four) times daily. ) 1 Inhaler 11  . diazepam (VALIUM) 5 MG tablet Take 1 tablet (5 mg total) by mouth every 12 (twelve) hours as needed for anxiety. 60 tablet 0  . hydrALAZINE (APRESOLINE) 50 MG tablet Take 1 tablet (50 mg total) by mouth 4 (four) times daily. 120 tablet 2  . HYDROcodone-acetaminophen (NORCO/VICODIN) 5-325 MG tablet Take 1 tablet by mouth every 8 (eight) hours as needed for severe pain. 15 tablet 0  . ibuprofen (ADVIL,MOTRIN) 200 MG tablet Take 400 mg by mouth daily as needed for moderate pain.    . isosorbide mononitrate (IMDUR) 30 MG 24 hr tablet Take 1 tablet (30 mg total) by mouth daily. 30 tablet 11  . losartan (COZAAR) 100 MG tablet Take 1 tablet (100 mg total) by mouth daily. 90 tablet 3  . methocarbamol (ROBAXIN-750) 750 MG tablet Take 1 tablet (750 mg total) by mouth every 8 (eight) hours as needed for muscle spasms. 21 tablet 0  . Multiple Vitamin (MULTIVITAMIN WITH MINERALS) TABS tablet Take 1 tablet by mouth daily.    . nicotine (NICODERM CQ - DOSED IN MG/24 HOURS) 14 mg/24hr patch Place 1 patch (14 mg total) onto the skin daily. 28 patch 0  . nitroGLYCERIN (NITROSTAT) 0.4 MG SL tablet Place 1 tablet (0.4 mg total) under the tongue every 5 (five) minutes as  needed for chest pain. If you need more than 2 doses call 911 20 tablet 1  . predniSONE (DELTASONE) 20 MG tablet Take every morning with food 60 mg daily for 3 days, 40 mg daily for 3 days, 30 mg daily for 3 days, 20 mg daily for 3 days, 10 mg daily for 3 days then STOP 24 tablet 0   No facility-administered medications prior to visit.     ROS Review of Systems  Constitutional: Negative for chills and fever.  HENT: Negative for dental problem.   Eyes: Negative for visual disturbance.  Respiratory: Negative for cough and shortness of breath.   Cardiovascular: Negative for chest pain.  Gastrointestinal: Negative for abdominal distention, abdominal pain, anal bleeding,  blood in stool, constipation, diarrhea, nausea, rectal pain and vomiting.  Musculoskeletal: Positive for arthralgias (R shoulder ), back pain and neck pain.  Skin: Negative for rash.  Allergic/Immunologic: Negative for immunocompromised state.  Neurological: Positive for headaches.  Hematological: Negative for adenopathy. Does not bruise/bleed easily.  Psychiatric/Behavioral: Negative for dysphoric mood and suicidal ideas.    Objective:  BP 139/79   Pulse 66   Temp 98.2 F (36.8 C) (Oral)   Wt 194 lb 3.2 oz (88.1 kg)   SpO2 96%   BMI 34.40 kg/m   BP/Weight 09/07/2016 2/77/4128 10/03/6765  Systolic BP 209 470 962  Diastolic BP 79 74 77  Wt. (Lbs) 194.2 193.6 -  BMI 34.4 34.29 -    Physical Exam  Constitutional: She appears well-developed and well-nourished. No distress.  HENT:  Right Ear: Tympanic membrane and ear canal normal.  Left Ear: Tympanic membrane and ear canal normal.  Nose: Nose normal.  Mouth/Throat: Oropharynx is clear and moist.  Eyes: Conjunctivae and EOM are normal.  Neck: Muscular tenderness present. Neck rigidity present. Erythema and decreased range of motion present. No edema present.    Cardiovascular: Normal rate, regular rhythm, normal heart sounds and intact distal pulses.   Pulmonary/Chest: Effort normal and breath sounds normal.  Genitourinary: Vagina normal and uterus normal. Pelvic exam was performed with patient prone. There is no rash, tenderness or lesion on the right labia. There is no rash, tenderness or lesion on the left labia. Cervix exhibits no motion tenderness, no discharge and no friability.  Musculoskeletal: She exhibits no edema.  Lymphadenopathy:       Right: No inguinal adenopathy present.       Left: No inguinal adenopathy present.  Skin: Skin is warm and dry. No rash noted.     Assessment & Plan:  There are no diagnoses linked to this encounter. There are no diagnoses linked to this encounter.  No orders of the defined  types were placed in this encounter.   Follow-up: No Follow-up on file.   Boykin Nearing MD

## 2016-09-08 DIAGNOSIS — R9389 Abnormal findings on diagnostic imaging of other specified body structures: Secondary | ICD-10-CM | POA: Insufficient documentation

## 2016-09-08 NOTE — Progress Notes (Signed)
Subjective:  Patient ID: Charlene Allen, female    DOB: 1955-06-10  Age: 61 y.o. MRN: [id number]  CC: Neck Pain   HPI Charlene Allen has HTN, diastolic dysfunction, smoker she presents for   1. Chronic neck and back pain: she has chronic pain since 2004 . She takes tylenol #4 1 tabs every 6 hrs. Valium 5 mg  BID for anxiety which also serves as a muscle relaxer. She is treated by pain management. She has had lumbar spine injections on 07/30/16. She reports worsening of her neck pain starting 07/31/16. R sided. Throbbing headache as well. No R sided neck stiffness and soreness. No fever, chills, nausea, vomiting, sensitivity to light or sound. She request Rx for tylenol #4 stating that it was better pain control. She reports her current neck pain is 8/10. Radiating to shoulder but not down R arm.   She reports no improvement with recent prednisone taper or robaxin. She took a short course of Vicodin which helped. She knows that Vicodin cannot be refilled chronically.    02/02/2016 lumbar MRI IIMPRESSION: 1. Stable compared to 2015.  No high-grade/compressive stenosis. 2. Advanced facet arthropathy at L3-4 and L4-5 with chronic grade 1 anterolisthesis at L4-5. 3. 9 mm endometrial stripe, unexpected for age but stable from 55. History of postmenopausal bleeding workup per ultrasound 10/11/2013, please ensure patient has appropriate gynecologic follow-up..  02/02/2016 cervical MRI IMPRESSION: 1. Advanced degenerative disc disease and right C3-4 facet arthropathy that has progressed from 2009. 2. 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 Bilateral foraminal impingement at the same levels, particularly severe at C3-4.   2. HTN: has HA today. Compliant with medication. No CP or shortness of breath. Smoking 1 PPD.   3. Smoking: desires to quit. She is taking 21 mg nicotine patch.    4. Healthcare maintenance: she has hx of C-scope x 2 in 30s due to  abdominal pain, r/o Chrohns, normal scopes.   Social History  Substance Use Topics  . Smoking status: Current Every Day Smoker    Packs/day: 1.00    Years: 40.00  . Smokeless tobacco: Never Used  . Alcohol use No    Outpatient Medications Prior to Visit  Medication Sig Dispense Refill  . acetaminophen-codeine (TYLENOL #4) 300-60 MG tablet Take 1 tablet by mouth every 6 (six) hours as needed for moderate pain. Fill on 08/21/16 120 tablet 0  . albuterol (PROVENTIL HFA;VENTOLIN HFA) 108 (90 Base) MCG/ACT inhaler Inhale 2 puffs into the lungs every 6 (six) hours as needed for wheezing or shortness of breath. 1 Inhaler 3  . aspirin 81 MG tablet Take 1 tablet (81 mg total) by mouth daily. 90 tablet 3  . atorvastatin (LIPITOR) 20 MG tablet Take 1 tablet (20 mg total) by mouth daily. 90 tablet 3  . budesonide-formoterol (SYMBICORT) 80-4.5 MCG/ACT inhaler Take 2 puffs first thing in am and then another 2 puffs about 12 hours later. (Patient taking differently: Inhale 1 puff into the lungs 4 (four) times daily. ) 1 Inhaler 11  . hydrALAZINE (APRESOLINE) 50 MG tablet Take 1 tablet (50 mg total) by mouth 4 (four) times daily. 120 tablet 2  . ibuprofen (ADVIL,MOTRIN) 200 MG tablet Take 400 mg by mouth daily as needed for moderate pain.    . isosorbide mononitrate (IMDUR) 30 MG 24 hr tablet Take 1 tablet (30 mg total) by mouth daily. 30 tablet 11  . losartan (COZAAR) 100 MG tablet Take 1 tablet (100 mg total) by  mouth daily. 90 tablet 3  . Multiple Vitamin (MULTIVITAMIN WITH MINERALS) TABS tablet Take 1 tablet by mouth daily.    . nicotine (NICODERM CQ - DOSED IN MG/24 HOURS) 14 mg/24hr patch Place 1 patch (14 mg total) onto the skin daily. 28 patch 0  . nitroGLYCERIN (NITROSTAT) 0.4 MG SL tablet Place 1 tablet (0.4 mg total) under the tongue every 5 (five) minutes as needed for chest pain. If you need more than 2 doses call 911 20 tablet 1  . diazepam (VALIUM) 5 MG tablet Take 1 tablet (5 mg total) by  mouth every 12 (twelve) hours as needed for anxiety. 60 tablet 0  . HYDROcodone-acetaminophen (NORCO/VICODIN) 5-325 MG tablet Take 1 tablet by mouth every 8 (eight) hours as needed for severe pain. 15 tablet 0  . methocarbamol (ROBAXIN-750) 750 MG tablet Take 1 tablet (750 mg total) by mouth every 8 (eight) hours as needed for muscle spasms. 21 tablet 0  . predniSONE (DELTASONE) 20 MG tablet Take every morning with food 60 mg daily for 3 days, 40 mg daily for 3 days, 30 mg daily for 3 days, 20 mg daily for 3 days, 10 mg daily for 3 days then STOP 24 tablet 0   No facility-administered medications prior to visit.     ROS Review of Systems  Constitutional: Negative for chills and fever.  HENT: Negative for dental problem.   Eyes: Negative for visual disturbance.  Respiratory: Negative for cough and shortness of breath.   Cardiovascular: Negative for chest pain.  Gastrointestinal: Negative for abdominal distention, abdominal pain, anal bleeding, blood in stool, constipation, diarrhea, nausea, rectal pain and vomiting.  Musculoskeletal: Positive for arthralgias (R shoulder ), back pain and neck pain.  Skin: Negative for rash.  Allergic/Immunologic: Negative for immunocompromised state.  Neurological: Positive for headaches.  Hematological: Negative for adenopathy. Does not bruise/bleed easily.  Psychiatric/Behavioral: Negative for dysphoric mood and suicidal ideas.    Objective:  BP 139/79   Pulse 66   Temp 98.2 F (36.8 C) (Oral)   Wt 194 lb 3.2 oz (88.1 kg)   SpO2 96%   BMI 34.40 kg/m   BP/Weight 09/07/2016 1/61/0960 07/01/4096  Systolic BP 119 147 829  Diastolic BP 79 74 77  Wt. (Lbs) 194.2 193.6 -  BMI 34.4 34.29 -   Physical Exam  Constitutional: She is oriented to person, place, and time. She appears well-developed and well-nourished. No distress.  HENT:  Head: Normocephalic and atraumatic.  Mouth/Throat: Oropharynx is clear and moist and mucous membranes are normal.    Cardiovascular: Normal rate, regular rhythm, normal heart sounds and intact distal pulses.   Pulmonary/Chest: Effort normal. She has rhonchi.  Musculoskeletal: She exhibits no edema.       Right shoulder: She exhibits decreased range of motion, tenderness, pain (R lateral and anterior shoulder ) and spasm. She exhibits no bony tenderness, no swelling, no effusion, no crepitus, no deformity, no laceration, normal pulse and normal strength.     Neurological: She is alert and oriented to person, place, and time.  Skin: Skin is warm and dry. No rash noted.  Psychiatric: She has a normal mood and affect.    EKG: normal EKG, normal sinus rhythm, unchanged from previous tracings. Assessment & Plan:  Charlene Allen was seen today for neck pain.  Diagnoses and all orders for this visit:  Chronic neck pain -     diazepam (VALIUM) 5 MG tablet; Take 1 tablet (5 mg total) by mouth every 12 (twelve)  hours as needed for anxiety. -     Discontinue: nortriptyline (PAMELOR) 25 MG capsule; Take 1 capsule (25 mg total) by mouth at bedtime. -     nortriptyline (PAMELOR) 25 MG capsule; Take by mouth 25 mg nightly, taper up by 25 mg weekly to max dose of 100 mg nightly  Chronic low back pain, unspecified back pain laterality, with sciatica presence unspecified -     diazepam (VALIUM) 5 MG tablet; Take 1 tablet (5 mg total) by mouth every 12 (twelve) hours as needed for anxiety. -     Discontinue: nortriptyline (PAMELOR) 25 MG capsule; Take 1 capsule (25 mg total) by mouth at bedtime. -     nortriptyline (PAMELOR) 25 MG capsule; Take by mouth 25 mg nightly, taper up by 25 mg weekly to max dose of 100 mg nightly   There are no diagnoses linked to this encounter. Charlene Allen was seen today for neck pain.  Diagnoses and all orders for this visit:  Chronic neck pain -     diazepam (VALIUM) 5 MG tablet; Take 1 tablet (5 mg total) by mouth every 12 (twelve) hours as needed for anxiety. -     Discontinue: nortriptyline  (PAMELOR) 25 MG capsule; Take 1 capsule (25 mg total) by mouth at bedtime. -     nortriptyline (PAMELOR) 25 MG capsule; Take by mouth 25 mg nightly, taper up by 25 mg weekly to max dose of 100 mg nightly  Chronic low back pain, unspecified back pain laterality, with sciatica presence unspecified -     diazepam (VALIUM) 5 MG tablet; Take 1 tablet (5 mg total) by mouth every 12 (twelve) hours as needed for anxiety. -     Discontinue: nortriptyline (PAMELOR) 25 MG capsule; Take 1 capsule (25 mg total) by mouth at bedtime. -     nortriptyline (PAMELOR) 25 MG capsule; Take by mouth 25 mg nightly, taper up by 25 mg weekly to max dose of 100 mg nightly  Increased endometrial stripe thickness -     Ambulatory referral to Obstetrics / Gynecology   Meds ordered this encounter  Medications  . diazepam (VALIUM) 5 MG tablet    Sig: Take 1 tablet (5 mg total) by mouth every 12 (twelve) hours as needed for anxiety.    Dispense:  60 tablet    Refill:  0  . DISCONTD: nortriptyline (PAMELOR) 25 MG capsule    Sig: Take 1 capsule (25 mg total) by mouth at bedtime.    Dispense:  30 capsule    Refill:  1  . nortriptyline (PAMELOR) 25 MG capsule    Sig: Take by mouth 25 mg nightly, taper up by 25 mg weekly to max dose of 100 mg nightly    Dispense:  90 capsule    Refill:  0    Follow-up: Return in about 3 weeks (around 09/28/2016) for chornic neck and back pain.   Boykin Nearing MD

## 2016-09-08 NOTE — Assessment & Plan Note (Signed)
Chronic back pain with advance facet arthropathy Continue tylenol #3 Lumbar injections from pain management Valium 5 mg BID Add Pamelor taper from 25 mg nightly to 100 mg nightly as tolerated

## 2016-09-08 NOTE — Assessment & Plan Note (Signed)
Noted on lumbar MRI 01/2016 Patient has history of dysfunctional uterine bleeding in 2015 Pelvic and transvaginal ultrasound revealed small focal lesion  S/p endometrial biopsy- negative

## 2016-09-08 NOTE — Assessment & Plan Note (Signed)
Chronic neck  pain with advance facet arthropathy and degenerative disc disease  Continue tylenol #3 Lumbar injections from pain management Valium 5 mg BID Add Pamelor taper from 25 mg nightly to 100 mg nightly as tolerated

## 2016-09-13 MED FILL — LOSARTAN POTASSIUM 100 MG T: 100 | 30 days supply | Qty: 30 | Fill #7

## 2016-09-14 ENCOUNTER — Telehealth: Payer: Self-pay | Admitting: Family Medicine

## 2016-09-14 DIAGNOSIS — G8929 Other chronic pain: Secondary | ICD-10-CM

## 2016-09-14 DIAGNOSIS — M5441 Lumbago with sciatica, right side: Secondary | ICD-10-CM

## 2016-09-14 DIAGNOSIS — M5442 Lumbago with sciatica, left side: Secondary | ICD-10-CM

## 2016-09-14 DIAGNOSIS — M542 Cervicalgia: Principal | ICD-10-CM

## 2016-09-14 NOTE — Telephone Encounter (Signed)
Will route to PCP 

## 2016-09-14 NOTE — Telephone Encounter (Signed)
Gm Patient  Requesting refill  acetaminophen-codeine (TYLENOL #4) 300-60 MG tablet . Patient want the nurse to call her she is been having severe headache and need to talk about meds . Please, call her   l

## 2016-09-16 MED ORDER — ACETAMINOPHEN-CODEINE #4 300-60 MG PO TABS: 1.0000 | ORAL_TABLET | Freq: Four times a day (QID) | ORAL | 0 refills | Status: DC | PRN

## 2016-09-16 MED FILL — diazePAM 5 MG TABS: 5 | 30 days supply | Qty: 60 | Fill #0

## 2016-09-16 NOTE — Telephone Encounter (Signed)
Pt calling to request a refill of her Tylenol #4. Also states that her htn meds (isosorbride) are causing headaches and she would like to change her script to another med. Please f/u with pt. Thank you.

## 2016-09-16 NOTE — Telephone Encounter (Signed)
Imdur prescribed by her cardiologist She is right, imdur can cause headaches  Unfortunately, 30 mg is the lowest available dose  She can stop but her BP will likely increase   If she stops she should increase hydralazine from 50  to 100 mg 4 times daily and schedule BP recheck in 2 weeks   I also recommend that she place a call to her cardiologist, Dr. Beau Fanny for recommendations.

## 2016-09-16 NOTE — Telephone Encounter (Signed)
  Reviewed [facility] Controlled Substance Reporting System. No unauthorized fills of controlled medications  Tylenol #4 refilled, please inform patient   Inquire about HA if  Still severe she will need to be evaluated

## 2016-09-17 ENCOUNTER — Encounter: Payer: Self-pay | Admitting: Physical Medicine & Rehabilitation

## 2016-09-17 ENCOUNTER — Other Ambulatory Visit: Payer: Self-pay

## 2016-09-17 ENCOUNTER — Ambulatory Visit (HOSPITAL_BASED_OUTPATIENT_CLINIC_OR_DEPARTMENT_OTHER): Payer: No Typology Code available for payment source | Admitting: Physical Medicine & Rehabilitation

## 2016-09-17 ENCOUNTER — Encounter: Payer: No Typology Code available for payment source | Attending: Physical Medicine & Rehabilitation

## 2016-09-17 VITALS — BP 163/73 | HR 82

## 2016-09-17 DIAGNOSIS — M47896 Other spondylosis, lumbar region: Secondary | ICD-10-CM | POA: Insufficient documentation

## 2016-09-17 DIAGNOSIS — M5382 Other specified dorsopathies, cervical region: Secondary | ICD-10-CM | POA: Insufficient documentation

## 2016-09-17 DIAGNOSIS — M5412 Radiculopathy, cervical region: Secondary | ICD-10-CM | POA: Insufficient documentation

## 2016-09-17 DIAGNOSIS — M542 Cervicalgia: Secondary | ICD-10-CM | POA: Insufficient documentation

## 2016-09-17 DIAGNOSIS — I10 Essential (primary) hypertension: Secondary | ICD-10-CM

## 2016-09-17 DIAGNOSIS — G8929 Other chronic pain: Secondary | ICD-10-CM | POA: Insufficient documentation

## 2016-09-17 DIAGNOSIS — M545 Low back pain: Secondary | ICD-10-CM | POA: Insufficient documentation

## 2016-09-17 DIAGNOSIS — M533 Sacrococcygeal disorders, not elsewhere classified: Secondary | ICD-10-CM

## 2016-09-17 MED ORDER — HYDRALAZINE HCL 100 MG PO TABS: 100.0000 mg | ORAL_TABLET | Freq: Four times a day (QID) | ORAL | 0 refills | Status: DC

## 2016-09-17 MED FILL — hydrALAZINE HCL 100 MG TABS: 100 | 30 days supply | Qty: 120 | Fill #0

## 2016-09-17 NOTE — Telephone Encounter (Signed)
Pt was called and informed of suggestions for medication. Pt states that she would like to stop the imdur and increase the hydralazine. Pt also states that she would follow up with cardiologist.

## 2016-09-17 NOTE — Progress Notes (Signed)
  PROCEDURE RECORD [facility]   Name: Aleeyah Bensen DOB:08-08-1955 MRN: [id number]  Date:09/17/2016  Physician: Alysia Penna, MD    Nurse/CMA:Bright CMA  Allergies: No Known Allergies  Consent Signed: No.  Is patient diabetic? No.  CBG today? N/A  Pregnant: No. LMP: No LMP recorded. Patient is postmenopausal. (age 60-55)  Anticoagulants: no Anti-inflammatory: no Antibiotics: no  Procedure: Bilateral Sacroiliac joint injection Position: Prone   Start Time: 1135am  End Time: 1142am  Fluoro Time:  29s  RN/CMA Shumaker RN Bright CMA    Time 11:01 1155am    BP 163/73 159/93    Pulse 82 87    Respirations 14 14    O2 Sat 94 96    S/S 6 6    Pain Level 8/10 7/10     D/C home with daughter patient A & O X 3, D/C instructions reviewed, and sits independently.

## 2016-09-17 NOTE — Patient Instructions (Signed)
Sacroiliac injection was performed today. A combination of a naming medicine plus a cortisone medicine was injected. The injection was done under x-ray guidance. This procedure has been performed to help reduce low back and buttocks pain as well as potentially hip pain. The duration of this injection is variable lasting from hours to  Months. It may repeated if needed. 

## 2016-09-17 NOTE — Progress Notes (Signed)

## 2016-09-23 ENCOUNTER — Ambulatory Visit: Payer: No Typology Code available for payment source

## 2016-09-30 ENCOUNTER — Ambulatory Visit: Payer: Self-pay | Attending: Family Medicine | Admitting: *Deleted

## 2016-09-30 VITALS — BP 140/70 | HR 59

## 2016-09-30 DIAGNOSIS — Z79899 Other long term (current) drug therapy: Secondary | ICD-10-CM | POA: Insufficient documentation

## 2016-09-30 DIAGNOSIS — Z0130 Encounter for examination of blood pressure without abnormal findings: Secondary | ICD-10-CM

## 2016-09-30 DIAGNOSIS — I10 Essential (primary) hypertension: Secondary | ICD-10-CM | POA: Insufficient documentation

## 2016-09-30 MED ORDER — AMLODIPINE BESYLATE 2.5 MG PO TABS: 2.5000 mg | ORAL_TABLET | Freq: Every day | ORAL | 0 refills | Status: DC

## 2016-09-30 MED FILL — AMLODIPINE BESYLATE 2.5 MG: 2.5 | 30 days supply | Qty: 30 | Fill #0

## 2016-09-30 NOTE — Progress Notes (Signed)
Pt arrived to [facility], Alert and oriented and arrives in good spirits. Last OV  09/07/16 with Dr. Adrian Blackwater. She was instructed to return to office for BP check after given medication adjustment when she called 09/14/2016.  Pt denies SOB, HA, dizziness, swelling or blurred vision.  She admits to chest pain 2 weeks ago, needing to take NTG. Denies chest pain today.  Verified medication with patient. She takes Hydralizine 50 QID States she is unable to tolerate dose at 100 mg. " Too much for me, it puts me in another world." She states BP has been normal.  She took BP at [facility] and reading was 111/77.  Manual blood pressure reading:  150/70 and 140/ 70  HR 59  Notified Dr. Adrian Blackwater of BP reading.   Plan- Add Norvasc 2.5 mg daily F/u with Dr. Adrian Blackwater at appointment on 10/22/2016. Pt to call Cardiologist to discuss medication. She states she could not remember his name.

## 2016-10-18 ENCOUNTER — Telehealth: Payer: Self-pay | Admitting: Family Medicine

## 2016-10-18 ENCOUNTER — Other Ambulatory Visit: Payer: Self-pay | Admitting: Family Medicine

## 2016-10-18 DIAGNOSIS — M5441 Lumbago with sciatica, right side: Secondary | ICD-10-CM

## 2016-10-18 DIAGNOSIS — G8929 Other chronic pain: Secondary | ICD-10-CM

## 2016-10-18 DIAGNOSIS — M5442 Lumbago with sciatica, left side: Secondary | ICD-10-CM

## 2016-10-18 DIAGNOSIS — M542 Cervicalgia: Principal | ICD-10-CM

## 2016-10-18 MED ORDER — ACETAMINOPHEN-CODEINE #4 300-60 MG PO TABS: 1.0000 | ORAL_TABLET | Freq: Four times a day (QID) | ORAL | 0 refills | Status: DC | PRN

## 2016-10-18 NOTE — Telephone Encounter (Signed)
Pt called to request a refill for  acetaminophen-codeine (TYLENOL #4) 300-60 MG tablet Please follow up

## 2016-10-18 NOTE — Telephone Encounter (Signed)
Please inform patient that tylenol #4 Rx ready for pick up

## 2016-10-19 NOTE — Telephone Encounter (Signed)
Pt was called and informed of script being ready for pick up.

## 2016-10-22 ENCOUNTER — Encounter: Payer: Self-pay | Admitting: Family Medicine

## 2016-10-22 ENCOUNTER — Ambulatory Visit: Payer: Self-pay | Attending: Family Medicine | Admitting: Family Medicine

## 2016-10-22 DIAGNOSIS — M542 Cervicalgia: Secondary | ICD-10-CM | POA: Insufficient documentation

## 2016-10-22 DIAGNOSIS — Z79899 Other long term (current) drug therapy: Secondary | ICD-10-CM | POA: Insufficient documentation

## 2016-10-22 DIAGNOSIS — G8929 Other chronic pain: Secondary | ICD-10-CM | POA: Insufficient documentation

## 2016-10-22 DIAGNOSIS — M5442 Lumbago with sciatica, left side: Secondary | ICD-10-CM | POA: Insufficient documentation

## 2016-10-22 DIAGNOSIS — I10 Essential (primary) hypertension: Secondary | ICD-10-CM | POA: Insufficient documentation

## 2016-10-22 DIAGNOSIS — M5441 Lumbago with sciatica, right side: Secondary | ICD-10-CM | POA: Insufficient documentation

## 2016-10-22 DIAGNOSIS — Z7951 Long term (current) use of inhaled steroids: Secondary | ICD-10-CM | POA: Insufficient documentation

## 2016-10-22 DIAGNOSIS — F17210 Nicotine dependence, cigarettes, uncomplicated: Secondary | ICD-10-CM | POA: Insufficient documentation

## 2016-10-22 DIAGNOSIS — M545 Low back pain: Secondary | ICD-10-CM

## 2016-10-22 DIAGNOSIS — Z7982 Long term (current) use of aspirin: Secondary | ICD-10-CM | POA: Insufficient documentation

## 2016-10-22 MED ORDER — AMLODIPINE BESYLATE 2.5 MG PO TABS: 2.5000 mg | ORAL_TABLET | Freq: Every day | ORAL | 3 refills | Status: DC

## 2016-10-22 MED ORDER — NORTRIPTYLINE HCL 50 MG PO CAPS: ORAL_CAPSULE | ORAL | 2 refills | Status: DC

## 2016-10-22 MED ORDER — LOSARTAN POTASSIUM 100 MG PO TABS: 100.0000 mg | ORAL_TABLET | Freq: Every day | ORAL | 3 refills | Status: DC

## 2016-10-22 MED ORDER — PREGABALIN 50 MG PO CAPS: 50.0000 mg | ORAL_CAPSULE | Freq: Three times a day (TID) | ORAL | 2 refills | Status: DC

## 2016-10-22 MED ORDER — AMLODIPINE BESYLATE 5 MG PO TABS: 5.0000 mg | ORAL_TABLET | Freq: Every day | ORAL | 3 refills | Status: DC

## 2016-10-22 MED ORDER — ACETAMINOPHEN-CODEINE #4 300-60 MG PO TABS: ORAL_TABLET | ORAL | 0 refills | Status: DC

## 2016-10-22 MED ORDER — HYDRALAZINE HCL 50 MG PO TABS: 50.0000 mg | ORAL_TABLET | Freq: Four times a day (QID) | ORAL | 5 refills | Status: DC

## 2016-10-22 NOTE — Progress Notes (Signed)
Subjective:  Patient ID: Charlene Allen, female    DOB: Dec 19, 1955  Age: 61 y.o. MRN: [id number]  CC: Neck Pain and Back Pain   HPI Charlene Allen has HTN, diastolic dysfunction, smoker she presents for   1. Chronic neck and back pain: she has chronic pain since 2004 . She takes tylenol #4 1 tabs every 6 hrs. Valium 5 mg nightly. She only takes valium during the day when she has injections done at pain management. She takes nortriptyline 50 mg nightly. She last took gabapentin in 2015 but stopped due to dizziness.  She had right SI injection on  09/17/2016 she reports her back pain improved for one week. She reports her current neck pain is 8/10. Radiating to shoulder but not down R arm.    02/02/2016 lumbar MRI IIMPRESSION: 1. Stable compared to 2015.  No high-grade/compressive stenosis. 2. Advanced facet arthropathy at L3-4 and L4-5 with chronic grade 1 anterolisthesis at L4-5. 3. 9 mm endometrial stripe, unexpected for age but stable from 38. History of postmenopausal bleeding workup per ultrasound 10/11/2013, please ensure patient has appropriate gynecologic follow-up..  02/02/2016 cervical MRI IMPRESSION: 1. Advanced degenerative disc disease and right C3-4 facet arthropathy that has progressed from 2009. 2. 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 Bilateral foraminal impingement at the same levels, particularly severe at C3-4.   2. HTN: stopped imdur and headaches stopped. Taking hydralazine once a day. compliant with amlodipine and losartan.   Social History  Substance Use Topics  . Smoking status: Current Every Day Smoker    Packs/day: 1.00    Years: 40.00  . Smokeless tobacco: Never Used  . Alcohol use No    Outpatient Medications Prior to Visit  Medication Sig Dispense Refill  . acetaminophen-codeine (TYLENOL #4) 300-60 MG tablet Take 1 tablet by mouth every 6 (six) hours as needed for moderate pain. Fill on 08/21/16 120 tablet  0  . albuterol (PROVENTIL HFA;VENTOLIN HFA) 108 (90 Base) MCG/ACT inhaler Inhale 2 puffs into the lungs every 6 (six) hours as needed for wheezing or shortness of breath. 1 Inhaler 3  . amLODipine (NORVASC) 2.5 MG tablet Take 1 tablet (2.5 mg total) by mouth daily. 90 tablet 0  . aspirin 81 MG tablet Take 1 tablet (81 mg total) by mouth daily. 90 tablet 3  . atorvastatin (LIPITOR) 20 MG tablet Take 1 tablet (20 mg total) by mouth daily. 90 tablet 3  . budesonide-formoterol (SYMBICORT) 80-4.5 MCG/ACT inhaler Take 2 puffs first thing in am and then another 2 puffs about 12 hours later. (Patient taking differently: Inhale 1 puff into the lungs 4 (four) times daily. ) 1 Inhaler 11  . diazepam (VALIUM) 5 MG tablet Take 1 tablet (5 mg total) by mouth every 12 (twelve) hours as needed for anxiety. 60 tablet 0  . hydrALAZINE (APRESOLINE) 50 MG tablet Take 1 tablet (50 mg total) by mouth 4 (four) times daily. 120 tablet 2  . ibuprofen (ADVIL,MOTRIN) 200 MG tablet Take 400 mg by mouth daily as needed for moderate pain.    Marland Kitchen losartan (COZAAR) 100 MG tablet Take 1 tablet (100 mg total) by mouth daily. 90 tablet 3  . Multiple Vitamin (MULTIVITAMIN WITH MINERALS) TABS tablet Take 1 tablet by mouth daily.    . nicotine (NICODERM CQ - DOSED IN MG/24 HOURS) 14 mg/24hr patch Place 1 patch (14 mg total) onto the skin daily. 28 patch 0  . nitroGLYCERIN (NITROSTAT) 0.4 MG SL tablet Place 1 tablet (  0.4 mg total) under the tongue every 5 (five) minutes as needed for chest pain. If you need more than 2 doses call 911 20 tablet 1  . nortriptyline (PAMELOR) 25 MG capsule Take by mouth 25 mg nightly, taper up by 25 mg weekly to max dose of 100 mg nightly 90 capsule 0  . hydrALAZINE (APRESOLINE) 100 MG tablet Take 1 tablet (100 mg total) by mouth 4 (four) times daily. (Patient not taking: Reported on 09/30/2016) 120 tablet 0  . isosorbide mononitrate (IMDUR) 30 MG 24 hr tablet Take 1 tablet (30 mg total) by mouth daily. (Patient  not taking: Reported on 09/17/2016) 30 tablet 11   No facility-administered medications prior to visit.     ROS Review of Systems  Constitutional: Negative for chills and fever.  HENT: Negative for dental problem.   Eyes: Negative for visual disturbance.  Respiratory: Negative for cough and shortness of breath.   Cardiovascular: Negative for chest pain.  Gastrointestinal: Negative for abdominal distention, abdominal pain, anal bleeding, blood in stool, constipation, diarrhea, nausea, rectal pain and vomiting.  Musculoskeletal: Positive for arthralgias (R shoulder ), back pain and neck pain.  Skin: Negative for rash.  Allergic/Immunologic: Negative for immunocompromised state.  Neurological: Negative for headaches.  Hematological: Negative for adenopathy. Does not bruise/bleed easily.  Psychiatric/Behavioral: Negative for dysphoric mood and suicidal ideas.    Objective:  BP 132/75   Pulse 68   Temp 98.2 F (36.8 C) (Oral)   Ht 5\' 3"  (1.6 m)   Wt 193 lb 12.8 oz (87.9 kg)   SpO2 95%   BMI 34.33 kg/m   BP/Weight 10/22/2016 09/30/2016 5/63/8756  Systolic BP 433 295 188  Diastolic BP 75 70 73  Wt. (Lbs) 193.8 - -  BMI 34.33 - -   Physical Exam  Constitutional: She is oriented to person, place, and time. She appears well-developed and well-nourished. No distress.  HENT:  Head: Normocephalic and atraumatic.  Mouth/Throat: Oropharynx is clear and moist and mucous membranes are normal.  Cardiovascular: Normal rate, regular rhythm, normal heart sounds and intact distal pulses.   Pulmonary/Chest: Effort normal. She has rhonchi.  Musculoskeletal: She exhibits no edema.       Right shoulder: She exhibits decreased range of motion, tenderness, pain (R lateral and anterior shoulder ) and spasm. She exhibits no bony tenderness, no swelling, no effusion, no crepitus, no deformity, no laceration, normal pulse and normal strength.     Neurological: She is alert and oriented to person, place,  and time.  Skin: Skin is warm and dry. No rash noted.  Psychiatric: She has a normal mood and affect.     Assessment & Plan:  Charlene Allen was seen today for neck pain and back pain.  Diagnoses and all orders for this visit:  Chronic neck pain -     Discontinue: acetaminophen-codeine (TYLENOL #4) 300-60 MG tablet; Take by mouth 1 tablets in every 6 hrs during the day and 2 tablets at night for total of 5 tablets per day -     Discontinue: pregabalin (LYRICA) 50 MG capsule; Take 1 capsule (50 mg total) by mouth 3 (three) times daily. For PASS -     nortriptyline (PAMELOR) 50 MG capsule; Take by mouth 75 mg nightly for one week, then increase to 100 mg nightly -     pregabalin (LYRICA) 50 MG capsule; Take 1 capsule (50 mg total) by mouth 3 (three) times daily. For PASS -     acetaminophen-codeine (TYLENOL #4) 300-60  MG tablet; Take by mouth 1 tablets in every 6 hrs during the day and 2 tablets at night for total of 5 tablets per day  Chronic bilateral low back pain with bilateral sciatica -     Discontinue: acetaminophen-codeine (TYLENOL #4) 300-60 MG tablet; Take by mouth 1 tablets in every 6 hrs during the day and 2 tablets at night for total of 5 tablets per day -     Discontinue: pregabalin (LYRICA) 50 MG capsule; Take 1 capsule (50 mg total) by mouth 3 (three) times daily. For PASS -     pregabalin (LYRICA) 50 MG capsule; Take 1 capsule (50 mg total) by mouth 3 (three) times daily. For PASS -     acetaminophen-codeine (TYLENOL #4) 300-60 MG tablet; Take by mouth 1 tablets in every 6 hrs during the day and 2 tablets at night for total of 5 tablets per day  Chronic low back pain, unspecified back pain laterality, with sciatica presence unspecified -     nortriptyline (PAMELOR) 50 MG capsule; Take by mouth 75 mg nightly for one week, then increase to 100 mg nightly  Essential hypertension, benign -     Discontinue: amLODipine (NORVASC) 2.5 MG tablet; Take 1 tablet (2.5 mg total) by mouth  daily. -     Discontinue: hydrALAZINE (APRESOLINE) 50 MG tablet; Take 1 tablet (50 mg total) by mouth 4 (four) times daily. -     losartan (COZAAR) 100 MG tablet; Take 1 tablet (100 mg total) by mouth daily. -     amLODipine (NORVASC) 5 MG tablet; Take 1 tablet (5 mg total) by mouth daily.    Follow-up: Return in about 4 weeks (around 11/19/2016) for back and neck pain .   Boykin Nearing MD

## 2016-10-22 NOTE — Patient Instructions (Addendum)
Charlene Allen was seen today for neck pain and back pain.  Diagnoses and all orders for this visit:  Chronic neck pain -     acetaminophen-codeine (TYLENOL #4) 300-60 MG tablet; Take by mouth 1 tablets in every 6 hrs during the day and 2 tablets at night for total of 5 tablets per day -     pregabalin (LYRICA) 50 MG capsule; Take 1 capsule (50 mg total) by mouth 3 (three) times daily. For PASS -     nortriptyline (PAMELOR) 50 MG capsule; Take by mouth 75 mg nightly for one week, then increase to 100 mg nightly  Chronic bilateral low back pain with bilateral sciatica -     acetaminophen-codeine (TYLENOL #4) 300-60 MG tablet; Take by mouth 1 tablets in every 6 hrs during the day and 2 tablets at night for total of 5 tablets per day -     pregabalin (LYRICA) 50 MG capsule; Take 1 capsule (50 mg total) by mouth 3 (three) times daily. For PASS  Chronic low back pain, unspecified back pain laterality, with sciatica presence unspecified -     nortriptyline (PAMELOR) 50 MG capsule; Take by mouth 75 mg nightly for one week, then increase to 100 mg nightly  Essential hypertension, benign -     Discontinue: amLODipine (NORVASC) 2.5 MG tablet; Take 1 tablet (2.5 mg total) by mouth daily. -     Discontinue: hydrALAZINE (APRESOLINE) 50 MG tablet; Take 1 tablet (50 mg total) by mouth 4 (four) times daily. -     losartan (COZAAR) 100 MG tablet; Take 1 tablet (100 mg total) by mouth daily. -     amLODipine (NORVASC) 5 MG tablet; Take 1 tablet (5 mg total) by mouth daily.   Stop once daily hydralazine Increase amlodipine to 5 mg daily Continue losartan 100 mg daily   Increase pamelor from 50 mg nightly to 75 mg nightly (take 3 of the 25 mg tablets or one of the 50 and one of the 25 mg tablets) for one week  Then 100 mg nightly   Increase tylenol #4 to 2 pills at night Adding lyrica, drop off Rx at pharmacy   Please apply for [facility] scholarship to access water exercises.  Your blood pressure is doing well,  continue current regime  F/u in 4 weeks for neck and back pain   Dr. Adrian Blackwater

## 2016-10-22 NOTE — Assessment & Plan Note (Signed)
A: chronic neck and back pain P: Increase evening tylenol #4 Start lyrica Dizziness with gabapentin  Taper up on Pamelor to 100 mg nightly

## 2016-10-22 NOTE — Assessment & Plan Note (Signed)
A: HTN well controlled Med: partially compliant\ P: stop hydralazine, removed imdur from med list Increase norvasc to 5 mg daily  Continue losartan 100 mg daily

## 2016-10-22 NOTE — Assessment & Plan Note (Addendum)
A: chronic neck and back pain P: Increase evening tylenol #4 Start lyrica Dizziness with gabapentin  Taper up on Pamelor to 100 mg nightly

## 2016-10-27 MED FILL — NORTRIPTYLINE HCL 25 MG CAP: 25 | 16 days supply | Qty: 60 | Fill #0

## 2016-10-27 MED FILL — hydrALAZINE HCL 50 MG TABS: 50 | 30 days supply | Qty: 120 | Fill #0

## 2016-10-27 MED FILL — AMLODIPINE BESYLATE 2.5 MG: 2.5 | 30 days supply | Qty: 30 | Fill #0

## 2016-10-27 MED FILL — LOSARTAN POTASSIUM 100 MG T: 100 | 30 days supply | Qty: 30 | Fill #8

## 2016-11-05 ENCOUNTER — Telehealth: Payer: Self-pay | Admitting: Family Medicine

## 2016-11-05 DIAGNOSIS — K0889 Other specified disorders of teeth and supporting structures: Secondary | ICD-10-CM

## 2016-11-05 MED ORDER — PENICILLIN V POTASSIUM 500 MG PO TABS: 500.0000 mg | ORAL_TABLET | Freq: Three times a day (TID) | ORAL | 0 refills | Status: AC

## 2016-11-05 NOTE — Telephone Encounter (Signed)
Please inform patient that I sent in penicillin If pain persist she will need office visit for additional treatment

## 2016-11-05 NOTE — Telephone Encounter (Signed)
Will route to PCP 

## 2016-11-05 NOTE — Telephone Encounter (Signed)
Pt was called and a VM was left informing pt of medication refill.

## 2016-11-05 NOTE — Telephone Encounter (Signed)
Patient called stated has toothache, declined appt for today with Dr. Wynetta Emery.  Stated just needs antibiotic callled in to [facility] on [address]  Please follow up with patient

## 2016-11-19 ENCOUNTER — Encounter: Payer: Self-pay | Admitting: Obstetrics & Gynecology

## 2016-11-29 ENCOUNTER — Other Ambulatory Visit: Payer: Self-pay | Admitting: Family Medicine

## 2016-11-29 DIAGNOSIS — I10 Essential (primary) hypertension: Secondary | ICD-10-CM

## 2016-11-30 MED FILL — AMLODIPINE BESYLATE 2.5 MG: 2.5 | 30 days supply | Qty: 30 | Fill #1

## 2016-12-01 ENCOUNTER — Ambulatory Visit: Payer: Self-pay

## 2016-12-06 ENCOUNTER — Telehealth: Payer: Self-pay | Admitting: Family Medicine

## 2016-12-06 NOTE — Telephone Encounter (Signed)
Patient called requesting medication refill on acetaminophen-codeine (TYLENOL #4) 300-60 MG tablet, pt states she will be out of medication before 09/18. Please f/up

## 2016-12-07 ENCOUNTER — Other Ambulatory Visit: Payer: Self-pay | Admitting: Family Medicine

## 2016-12-07 ENCOUNTER — Other Ambulatory Visit: Payer: Self-pay | Admitting: Internal Medicine

## 2016-12-07 DIAGNOSIS — I10 Essential (primary) hypertension: Secondary | ICD-10-CM

## 2016-12-07 MED ORDER — ACETAMINOPHEN-CODEINE #4 300-60 MG PO TABS: 1.0000 | ORAL_TABLET | Freq: Four times a day (QID) | ORAL | 0 refills | Status: DC | PRN

## 2016-12-07 NOTE — Telephone Encounter (Signed)
Contacted pt to make aware her Tylenol #4

## 2016-12-07 NOTE — Telephone Encounter (Signed)
Pt has appt with me later this mth. Will give RF on Tylenol #4 to last until that visit.

## 2016-12-07 NOTE — Telephone Encounter (Signed)
Pt called to check on the statu of the refill request for her med for Tylenol #4, please if you auth or not call the pt back, please follow up

## 2016-12-10 ENCOUNTER — Other Ambulatory Visit: Payer: Self-pay | Admitting: Pharmacist

## 2016-12-10 DIAGNOSIS — F17200 Nicotine dependence, unspecified, uncomplicated: Secondary | ICD-10-CM

## 2016-12-10 DIAGNOSIS — I10 Essential (primary) hypertension: Secondary | ICD-10-CM

## 2016-12-10 MED ORDER — ATORVASTATIN CALCIUM 20 MG PO TABS: 20.0000 mg | ORAL_TABLET | Freq: Every day | ORAL | 0 refills | Status: DC

## 2016-12-10 MED FILL — ?ATORVASTATIN 20 MG TABLET: 20 | 30 days supply | Qty: 30 | Fill #0

## 2016-12-14 ENCOUNTER — Ambulatory Visit: Payer: Self-pay | Attending: Internal Medicine | Admitting: Internal Medicine

## 2016-12-14 ENCOUNTER — Encounter: Payer: Self-pay | Admitting: Internal Medicine

## 2016-12-14 VITALS — BP 174/87 | HR 77 | Temp 98.5°F | Resp 16 | Wt 191.4 lb

## 2016-12-14 DIAGNOSIS — Z836 Family history of other diseases of the respiratory system: Secondary | ICD-10-CM | POA: Insufficient documentation

## 2016-12-14 DIAGNOSIS — Z8249 Family history of ischemic heart disease and other diseases of the circulatory system: Secondary | ICD-10-CM | POA: Insufficient documentation

## 2016-12-14 DIAGNOSIS — F17210 Nicotine dependence, cigarettes, uncomplicated: Secondary | ICD-10-CM | POA: Insufficient documentation

## 2016-12-14 DIAGNOSIS — Z7982 Long term (current) use of aspirin: Secondary | ICD-10-CM | POA: Insufficient documentation

## 2016-12-14 DIAGNOSIS — Z9889 Other specified postprocedural states: Secondary | ICD-10-CM | POA: Insufficient documentation

## 2016-12-14 DIAGNOSIS — Z6833 Body mass index (BMI) 33.0-33.9, adult: Secondary | ICD-10-CM | POA: Insufficient documentation

## 2016-12-14 DIAGNOSIS — Z955 Presence of coronary angioplasty implant and graft: Secondary | ICD-10-CM | POA: Insufficient documentation

## 2016-12-14 DIAGNOSIS — Z9851 Tubal ligation status: Secondary | ICD-10-CM | POA: Insufficient documentation

## 2016-12-14 DIAGNOSIS — Z823 Family history of stroke: Secondary | ICD-10-CM | POA: Insufficient documentation

## 2016-12-14 DIAGNOSIS — M542 Cervicalgia: Secondary | ICD-10-CM | POA: Insufficient documentation

## 2016-12-14 DIAGNOSIS — Z8049 Family history of malignant neoplasm of other genital organs: Secondary | ICD-10-CM | POA: Insufficient documentation

## 2016-12-14 DIAGNOSIS — G8929 Other chronic pain: Secondary | ICD-10-CM | POA: Insufficient documentation

## 2016-12-14 DIAGNOSIS — Z23 Encounter for immunization: Secondary | ICD-10-CM | POA: Insufficient documentation

## 2016-12-14 DIAGNOSIS — I10 Essential (primary) hypertension: Secondary | ICD-10-CM | POA: Insufficient documentation

## 2016-12-14 DIAGNOSIS — J449 Chronic obstructive pulmonary disease, unspecified: Secondary | ICD-10-CM | POA: Insufficient documentation

## 2016-12-14 DIAGNOSIS — F329 Major depressive disorder, single episode, unspecified: Secondary | ICD-10-CM | POA: Insufficient documentation

## 2016-12-14 DIAGNOSIS — F32A Depression, unspecified: Secondary | ICD-10-CM

## 2016-12-14 DIAGNOSIS — M5442 Lumbago with sciatica, left side: Secondary | ICD-10-CM | POA: Insufficient documentation

## 2016-12-14 DIAGNOSIS — M5441 Lumbago with sciatica, right side: Secondary | ICD-10-CM | POA: Insufficient documentation

## 2016-12-14 DIAGNOSIS — E785 Hyperlipidemia, unspecified: Secondary | ICD-10-CM | POA: Insufficient documentation

## 2016-12-14 MED ORDER — ATORVASTATIN CALCIUM 20 MG PO TABS: 20.0000 mg | ORAL_TABLET | Freq: Every day | ORAL | 1 refills | Status: DC

## 2016-12-14 MED ORDER — AMLODIPINE BESYLATE 10 MG PO TABS: 10.0000 mg | ORAL_TABLET | Freq: Every day | ORAL | 2 refills | Status: DC

## 2016-12-14 MED ORDER — ESCITALOPRAM OXALATE 10 MG PO TABS: ORAL_TABLET | ORAL | 1 refills | Status: DC

## 2016-12-14 MED ORDER — ACETAMINOPHEN-CODEINE #4 300-60 MG PO TABS: 1.0000 | ORAL_TABLET | Freq: Four times a day (QID) | ORAL | 1 refills | Status: DC | PRN

## 2016-12-14 MED ORDER — METHOCARBAMOL 750 MG PO TABS: 750.0000 mg | ORAL_TABLET | Freq: Three times a day (TID) | ORAL | 1 refills | Status: DC | PRN

## 2016-12-14 MED FILL — AMLODIPINE BESYLATE 10 MG T: 10 | 30 days supply | Qty: 30 | Fill #0

## 2016-12-14 MED FILL — ESCITALOPRAM 10 MG TABLET: 10 | 30 days supply | Qty: 30 | Fill #0

## 2016-12-14 NOTE — Patient Instructions (Addendum)
You have been referred to a neurosurgeon to see whether you are a surgical candidate for your back. I will see about getting into a pain psychologist.   Stop nortriptyline. Start Lexapro to help with depression.  Stop Valium. Use methocarbamol as needed for muscle spasms. Continue Tylenol with Codeine as needed.  [place] to schedule mammogram [phone]   Influenza Virus Vaccine injection (Fluarix) What is this medicine? INFLUENZA VIRUS VACCINE (in floo EN zuh VAHY ruhs vak SEEN) helps to reduce the risk of getting influenza also known as the flu. This medicine may be used for other purposes; ask your health care provider or pharmacist if you have questions. COMMON BRAND NAME(S): Fluarix, Fluzone What should I tell my health care provider before I take this medicine? They need to know if you have any of these conditions: -bleeding disorder like hemophilia -fever or infection -Guillain-Barre syndrome or other neurological problems -immune system problems -infection with the human immunodeficiency virus (HIV) or AIDS -low blood platelet counts -multiple sclerosis -an unusual or allergic reaction to influenza virus vaccine, eggs, chicken proteins, latex, gentamicin, other medicines, foods, dyes or preservatives -pregnant or trying to get pregnant -breast-feeding How should I use this medicine? This vaccine is for injection into a muscle. It is given by a health care professional. A copy of Vaccine Information Statements will be given before each vaccination. Read this sheet carefully each time. The sheet may change frequently. Talk to your pediatrician regarding the use of this medicine in children. Special care may be needed. Overdosage: If you think you have taken too much of this medicine contact a poison control center or emergency room at once. NOTE: This medicine is only for you. Do not share this medicine with others. What if I miss a dose? This  does not apply. What may interact with this medicine? -chemotherapy or radiation therapy -medicines that lower your immune system like etanercept, anakinra, infliximab, and adalimumab -medicines that treat or prevent blood clots like warfarin -phenytoin -steroid medicines like prednisone or cortisone -theophylline -vaccines This list may not describe all possible interactions. Give your health care provider a list of all the medicines, herbs, non-prescription drugs, or dietary supplements you use. Also tell them if you smoke, drink alcohol, or use illegal drugs. Some items may interact with your medicine. What should I watch for while using this medicine? Report any side effects that do not go away within 3 days to your doctor or health care professional. Call your health care provider if any unusual symptoms occur within 6 weeks of receiving this vaccine. You may still catch the flu, but the illness is not usually as bad. You cannot get the flu from the vaccine. The vaccine will not protect against colds or other illnesses that may cause fever. The vaccine is needed every year. What side effects may I notice from receiving this medicine? Side effects that you should report to your doctor or health care professional as soon as possible: -allergic reactions like skin rash, itching or hives, swelling of the face, lips, or tongue Side effects that usually do not require medical attention (report to your doctor or health care professional if they continue or are bothersome): -fever -headache -muscle aches and pains -pain, tenderness, redness, or swelling at site where injected -weak or tired This list may not describe all possible side effects. Call your doctor for medical advice about side effects. You may report side effects to FDA at 1-800-FDA-1088. Where should I keep  my medicine? This vaccine is only given in a clinic, pharmacy, doctor's office, or other health care setting and will not be  stored at home. NOTE: This sheet is a summary. It may not cover all possible information. If you have questions about this medicine, talk to your doctor, pharmacist, or health care provider.  2018 Elsevier/Gold Standard (2007-10-11 09:30:40)

## 2016-12-14 NOTE — Progress Notes (Signed)
Patient ID: Charlene Allen, female    DOB: 1956-02-09  MRN: [id number]  CC: re-establish; Back Pain; Neck Pain; and Hypertension   Subjective: Charlene Allen is a 61 y.o. female who presents for chronic ds management.  Last saw Dr. Adrian Blackwater 09/2016 Her concerns today include:  Hx of chronic neck/lower back pain on Tylenol #4, Pamelor, Lyrica, Valuim), HTN, tob, obesity, COPD  1. HM: due for flu shot, [facility] (never called for order submitted 07/2016); wants to hold on colonoscopy for now  2. HTN: -compliant with Losartan and Norvasc.  Imdur and Hydralazine stopped on last visit -limits salt in foods -CP when she gets stressed. Cardiac cath 06/2016 with minnimal nonobstrutive CAD -has a wrist cuff, BP checks once a day. Range SBP 130-140s, DBP 80s  3. Chronic LBP/Neck Pain -MRI results from 01/2016 reviewed -sees Dr. Letta Pate.  "Those shots do not work. Last for about 1 wk and pain comes back worse."  Has f/u this Friday and does not want to go. -Other modalities:  never did P.T or has seen a pain psychologisit.  Never saw neurosurgeon to determine whether she is a surgical candidate Limitations:  "I try to walk mostly but hard because I get muscle spasms in my back.  Has grandchildren (3) who lives with her and she tries to assist in their care.  -pain prevents her from doing house work like mopping "because my daughter is not home most of the time.  If I try to do too much those muscle spasms set in."  She does most of the cooking. Did grocery shopping yesterday  "but it was so bad. I was hurting so bad." -worse in mornings when she first gets up and at nights "because I try to do things around the house."  -pain radiates in both legs but LT worse than RT especially when she lay on LT side at night. Back pain worse than neck symptoms wise "but I've been told by x-ray the neck is worse."  Neck flares mainly in winter months -nothing makes it better. "The Tyenol 4 helps some, it is better  than nothing."  Before taking it in a.m  Pain is 9/10, after meds 6.5/10  "Pamelor puts me to sleep but makes me too groggy.  I don't like it." Did not like Elavil in past for same reason. Takes Valium mainly at nights but out x 1 wks.  Tried Flexeril in the past.  "It makes me mean." Can not tell any difference with Lyrica. "Gabapentin stopped because it did not work any more." -admits depression due to chronic pain. "I stay under a lot of stress but anxiety not a major issue."  -smoke weeds sometimes; "it helps with pain."  denies any other street substances. Last used 2-3 wks ago. UDS done by Dr. Letta Pate 04/2016 was positive for Methadone, benzo and marijuana  Patient Active Problem List   Diagnosis Date Noted  . Increased endometrial stripe thickness 09/08/2016  . Chronic neck pain 08/16/2016  . Chronic right shoulder pain 07/22/2016  . Morbid obesity due to excess calories ([place]) 06/20/2016  . COPD GOLD 0 still smoking  06/18/2016  . HPV test positive 10/13/2015  . Precordial chest pain 09/09/2015  . Mild diastolic dysfunction 67/34/1937  . Chronic diarrhea 08/13/2015  . Cigarette smoker 08/13/2015  . Cough 08/13/2015  . SOB (shortness of breath) 08/13/2015  . Chronic back pain 03/15/2013  . Essential hypertension, benign 03/15/2013  . History of substance abuse 03/15/2013  Current Outpatient Prescriptions on File Prior to Visit  Medication Sig Dispense Refill  . albuterol (PROVENTIL HFA;VENTOLIN HFA) 108 (90 Base) MCG/ACT inhaler Inhale 2 puffs into the lungs every 6 (six) hours as needed for wheezing or shortness of breath. 1 Inhaler 3  . aspirin 81 MG tablet Take 1 tablet (81 mg total) by mouth daily. 90 tablet 3  . budesonide-formoterol (SYMBICORT) 80-4.5 MCG/ACT inhaler Take 2 puffs first thing in am and then another 2 puffs about 12 hours later. (Patient taking differently: Inhale 1 puff into the lungs 4 (four) times daily. ) 1 Inhaler 11  . ibuprofen (ADVIL,MOTRIN) 200 MG  tablet Take 400 mg by mouth daily as needed for moderate pain.    Marland Kitchen losartan (COZAAR) 100 MG tablet Take 1 tablet (100 mg total) by mouth daily. 90 tablet 3  . Multiple Vitamin (MULTIVITAMIN WITH MINERALS) TABS tablet Take 1 tablet by mouth daily.    . nicotine (NICODERM CQ - DOSED IN MG/24 HOURS) 14 mg/24hr patch Place 1 patch (14 mg total) onto the skin daily. 28 patch 0  . nitroGLYCERIN (NITROSTAT) 0.4 MG SL tablet Place 1 tablet (0.4 mg total) under the tongue every 5 (five) minutes as needed for chest pain. If you need more than 2 doses call 911 20 tablet 1  . pregabalin (LYRICA) 50 MG capsule Take 1 capsule (50 mg total) by mouth 3 (three) times daily. For PASS 90 capsule 2   No current facility-administered medications on file prior to visit.     No Known Allergies  Social History   Social History  . Marital status: Married    Spouse name: N/A  . Number of children: N/A  . Years of education: N/A   Occupational History  . Not on file.   Social History Main Topics  . Smoking status: Current Every Day Smoker    Packs/day: 1.00    Years: 40.00  . Smokeless tobacco: Never Used  . Alcohol use No  . Drug use: No  . Sexual activity: Not Currently    Birth control/ protection: None   Other Topics Concern  . Not on file   Social History Narrative  . No narrative on file    Family History  Problem Relation Age of Onset  . COPD Mother   . Stroke Mother   . Heart disease Mother   . Hypertension Mother   . Cancer Sister 25       cervical cancer   . Stroke Maternal Grandmother     Past Surgical History:  Procedure Laterality Date  . ABDOMINAL AORTOGRAM N/A 07/12/2016   Procedure: Abdominal Aortogram;  Surgeon: Jettie Booze, MD;  Location: [facility];  Service: Cardiovascular;  Laterality: N/A;  . HAND SURGERY    . LEFT HEART CATH AND CORONARY ANGIOGRAPHY N/A 07/12/2016   Procedure: Left Heart Cath and Coronary Angiography;  Surgeon: Jettie Booze,  MD;  Location: [facility];  Service: Cardiovascular;  Laterality: N/A;  . TUBAL LIGATION      ROS: Review of Systems Negative except as stated above  PHYSICAL EXAM: BP (!) 174/87   Pulse 77   Temp 98.5 F (36.9 C) (Oral)   Resp 16   Wt 191 lb 6.4 oz (86.8 kg)   SpO2 95%   BMI 33.90 kg/m   Wt Readings from Last 3 Encounters:  12/14/16 191 lb 6.4 oz (86.8 kg)  10/22/16 193 lb 12.8 oz (87.9 kg)  09/07/16 194 lb  3.2 oz (88.1 kg)   Physical Exam General appearance - alert, pt appears stressed and uncomfortable Mental status - oriented x 3. Good eye contact.  Chest - clear to auscultation, no wheezes, rales or rhonchi, symmetric air entry Heart - normal rate, regular rhythm, normal S1, S2, no murmurs, rubs, clicks or gallops Musculoskeletal -gait is guarded. Mild tenderness on palpation of lumbar paraspinal muscles. She ambulates unassisted. Strength in lower extremity 5 out of 5 but gives way to pain Extremities -no lower extremity edema  Depression screen [facility] 2/9 12/14/2016 10/22/2016 09/17/2016 09/07/2016 08/16/2016  Decreased Interest 2 1 3 3 1   Down, Depressed, Hopeless 2 1 3 3 1   PHQ - 2 Score 4 2 6 6 2   Altered sleeping 3 1 - 3 1  Tired, decreased energy 3 2 - 3 2  Change in appetite 2 2 - 3 1  Feeling bad or failure about yourself  2 2 - 3 1  Trouble concentrating 1 1 - 2 0  Moving slowly or fidgety/restless 1 2 - 3 1  Suicidal thoughts 0 0 - 1 0  PHQ-9 Score 16 12 - 24 8  Difficult doing work/chores - - - - -  Some recent data might be hidden   GAD 7 : Generalized Anxiety Score 12/14/2016 10/22/2016 09/07/2016 08/16/2016  Nervous, Anxious, on Edge 1 1 1 1   Control/stop worrying 2 1 1 1   Worry too much - different things 2 1 1 1   Trouble relaxing 1 1 2 1   Restless 1 1 1 1   Easily annoyed or irritable 1 1 1 1   Afraid - awful might happen 2 1 1  0  Total GAD 7 Score 10 7 8 6    LUMBAR MRI 01/2016 IMPRESSION: 1. Stable compared to 2015.  No high-grade/compressive  stenosis. 2. Advanced facet arthropathy at L3-4 and L4-5 with chronic grade 1 anterolisthesis at L4-5. 3. 9 mm endometrial stripe, unexpected for age but stable from 49. History of postmenopausal bleeding workup per ultrasound 10/11/2013, please ensure patient has appropriate gynecologic follow-up.  ASSESSMENT AND PLAN: 1. Essential hypertension, benign -not at goal. Inc Norvasc to 10 mg - amLODipine (NORVASC) 10 MG tablet; Take 1 tablet (10 mg total) by mouth daily.  Dispense: 90 tablet; Refill: 2  2. Chronic bilateral low back pain with bilateral sciatica -Patient informed that she will probably never be 100% pain free so we need to focus on some pain control to the extent that it allows her to function and do some of the things she would like to do. -Encourage her to keep her next appointment with Dr. Letta Pate Second however like for her to see a neurosurgeon to determine whether surgery is an option given the impact this is having on her quality of life. If she is not a surgical candidate then consider physical therapy and referral to a psychologist to help with pain coping skills -Refill given on Tylenol #4 with a limit of 120 tablets a month. Went over possible side effects of medication including drowsiness and constipation and potential for dependence and addiction. Discontinue Valium Robaxin to use when necessary for muscle spasms -Advised discontinue use of marijuana and that moving forward I would like to see urine drug screens negative for this substance. She expressed understanding -Updated controlled substance prescribing agreement done today [place] reviewed. - [id number] 11+Oxyco+Alc+Crt-Bund - escitalopram (LEXAPRO) 10 MG tablet; 1/2 tab daily x 2 wks then 1 tab daily PO  Dispense: 30 tablet; Refill: 1 - methocarbamol (ROBAXIN-750)  750 MG tablet; Take 1 tablet (750 mg total) by mouth every 8 (eight) hours as needed for muscle spasms.  Dispense: 60 tablet; Refill: 1  3.  Chronic neck pain   4. Need for influenza vaccination - Flu Vaccine QUAD 6+ mos PF IM (Fluarix Quad PF)  5. Hyperlipidemia, unspecified hyperlipidemia type - atorvastatin (LIPITOR) 20 MG tablet; Take 1 tablet (20 mg total) by mouth daily.  Dispense: 90 tablet; Refill: 1  6. Depression, unspecified depression type -stop Pamelor Patient is agreeable to trying Lexapro. She will let me know if she has any increased depression with the medication. - escitalopram (LEXAPRO) 10 MG tablet; 1/2 tab daily x 2 wks then 1 tab daily PO  Dispense: 30 tablet; Refill: 1  Patient was given the opportunity to ask questions.  Patient verbalized understanding of the plan and was able to repeat key elements of the plan.   Orders Placed This Encounter  Procedures  . Flu Vaccine QUAD 6+ mos PF IM (Fluarix Quad PF)  . [id number] 11+Oxyco+Alc+Crt-Bund     Requested Prescriptions   Signed Prescriptions Disp Refills  . atorvastatin (LIPITOR) 20 MG tablet 90 tablet 1    Sig: Take 1 tablet (20 mg total) by mouth daily.  Marland Kitchen escitalopram (LEXAPRO) 10 MG tablet 30 tablet 1    Sig: 1/2 tab daily x 2 wks then 1 tab daily PO  . amLODipine (NORVASC) 10 MG tablet 90 tablet 2    Sig: Take 1 tablet (10 mg total) by mouth daily.  . methocarbamol (ROBAXIN-750) 750 MG tablet 60 tablet 1    Sig: Take 1 tablet (750 mg total) by mouth every 8 (eight) hours as needed for muscle spasms.  Marland Kitchen acetaminophen-codeine (TYLENOL #4) 300-60 MG tablet 120 tablet 1    Sig: Take 1 tablet by mouth every 6 (six) hours as needed for moderate pain.    Return in about 7 weeks (around 02/01/2017).  Karle Plumber, MD, FACP

## 2016-12-15 ENCOUNTER — Other Ambulatory Visit: Payer: Self-pay | Admitting: Pharmacist

## 2016-12-15 ENCOUNTER — Ambulatory Visit: Payer: Self-pay | Attending: Internal Medicine

## 2016-12-15 MED ORDER — METHOCARBAMOL 500 MG PO TABS: 500.0000 mg | ORAL_TABLET | Freq: Three times a day (TID) | ORAL | 1 refills | Status: DC | PRN

## 2016-12-15 MED FILL — METHOCARBAMOL 500 MG TABS: 500 | 20 days supply | Qty: 60 | Fill #0

## 2016-12-15 NOTE — Telephone Encounter (Signed)
Methocarbamol 750 mg is on [facility]. Changed prescription to 500 mg with same quantity and directions.

## 2016-12-17 ENCOUNTER — Ambulatory Visit: Payer: Self-pay | Admitting: Physical Medicine & Rehabilitation

## 2016-12-24 LAB — BENZODIAZEPINES CONFIRM, URINE
Alprazolam: NEGATIVE
Benzodiazepines: POSITIVE ng/mL — AB
Clonazepam: NEGATIVE
Flurazepam: NEGATIVE
Lorazepam: NEGATIVE
Midazolam: NEGATIVE
Nordiazepam: NEGATIVE
Oxazepam Conf: 485 ng/mL
Oxazepam: POSITIVE — AB
Temazepam Conf.: 110 ng/mL
Temazepam: POSITIVE — AB
Triazolam: NEGATIVE

## 2016-12-24 LAB — OPIATES CONFIRMATION, URINE
Codeine Confirm: >3000 ng/mL
Codeine: POSITIVE — AB
Hydromorphone: NEGATIVE
Morphine Confirm: >3000 ng/mL
Morphine: POSITIVE — AB

## 2016-12-24 LAB — DRUG SCREEN 764883 11+OXYCO+ALC+CRT-BUND
Amphetamines, Urine: NEGATIVE ng/mL
Barbiturate: NEGATIVE ng/mL
CANNABINOID QUANT UR: NEGATIVE ng/mL
COCAINE (METABOLITE): NEGATIVE ng/mL
Creatinine: 99 mg/dL (ref 20.0–300.0)
ETHANOL: NEGATIVE %
MEPERIDINE: NEGATIVE ng/mL
OXYCODONE+OXYMORPHONE UR QL SCN: NEGATIVE ng/mL
PHENCYCLIDINE: NEGATIVE ng/mL
Propoxyphene: NEGATIVE ng/mL
Tramadol: NEGATIVE ng/mL
pH, Urine: 5.8 (ref 4.5–8.9)

## 2016-12-24 LAB — METHADONE CONF GC/MS: METHADONE: NEGATIVE

## 2016-12-27 ENCOUNTER — Encounter: Payer: Self-pay | Admitting: Internal Medicine

## 2016-12-27 ENCOUNTER — Encounter: Payer: Self-pay | Attending: Physical Medicine & Rehabilitation

## 2016-12-27 ENCOUNTER — Encounter: Payer: Self-pay | Admitting: Physical Medicine & Rehabilitation

## 2016-12-27 ENCOUNTER — Ambulatory Visit (HOSPITAL_BASED_OUTPATIENT_CLINIC_OR_DEPARTMENT_OTHER): Payer: Self-pay | Admitting: Physical Medicine & Rehabilitation

## 2016-12-27 VITALS — BP 156/84 | HR 71

## 2016-12-27 DIAGNOSIS — M533 Sacrococcygeal disorders, not elsewhere classified: Secondary | ICD-10-CM | POA: Insufficient documentation

## 2016-12-27 NOTE — Progress Notes (Signed)
  PROCEDURE RECORD [facility]   Name: Charlene Allen DOB:08-14-1955 MRN: [id number]  Date:12/27/2016  Physician: Alysia Penna, MD    Nurse/CMA: Kemon Devincenzi CMA  Allergies: No Known Allergies  Consent Signed: Yes.    Is patient diabetic? No.  CBG today? NA  Pregnant: No. LMP: No LMP recorded. Patient is postmenopausal. (age 60-55)  Anticoagulants: yes (aspirin, this morning) Anti-inflammatory: no Antibiotics: no  Procedure: Bilateral Sacroiliac  Position: Prone   Start Time: 232pm   End Time: 242pm Fluoro Time: 30s  RN/CMA Jashua Knaak CMA Leah Thornberry CMA    Time 214pm 248pm    BP 156/84 173/94    Pulse 71 76    Respirations 18 18    O2 Sat 96 96    S/S 6 6    Pain Level 8/10 6/10     D/C home with Dyanne Carrel, patient A & O X 3, D/C instructions reviewed, and sits independently.

## 2016-12-27 NOTE — Progress Notes (Signed)
UDS results noted and is appropriate for what pt is/waas on. She is on Tylenol with codeine. This shows up as pos morphine and codeine on UDS.  Pt was also on Valium. Based on how med is metabolized, this shows up as pos Oxazepam and Temazepam. Results for orders placed or performed in visit on 12/14/16  [id number] 11+Oxyco+Alc+Crt-Bund  Result Value Ref Range   Ethanol Negative Cutoff=0.020 %   Amphetamines, Urine Negative Cutoff=1000 ng/mL   Barbiturate Negative Cutoff=200 ng/mL   BENZODIAZ UR QL See Final Results Cutoff=200 ng/mL   Cannabinoid Quant, Ur Negative Cutoff=50 ng/mL   Cocaine (Metabolite) Negative Cutoff=300 ng/mL   OPIATE SCREEN URINE See Final Results Cutoff=300 ng/mL   OXYCODONE+OXYMORPHONE UR QL SCN Negative Cutoff=300 ng/mL   Phencyclidine Negative Cutoff=25 ng/mL   Methadone Screen, Urine See Final Results Cutoff=300 ng/mL   Propoxyphene Negative Cutoff=300 ng/mL   Meperidine Negative Cutoff=200 ng/mL   Tramadol Negative Cutoff=200 ng/mL   Creatinine 99.0 20.0 - 300.0 mg/dL   PH OF URINE 5.8 4.5 - 8.9  Benzodiazepines Confirm, Urine  Result Value Ref Range   Benzodiazepines Positive (A) Cutoff=100 ng/mL   Nordiazepam Negative Cutoff=100   Oxazepam Positive (A)    Oxazepam Conf 485 Cutoff=100 ng/mL   Flurazepam Negative Cutoff=100   Lorazepam Negative Cutoff=100   Alprazolam Negative Cutoff=100   Clonazepam Negative Cutoff=100   Temazepam Positive (A)    Temazepam Conf. 110 Cutoff=100 ng/mL   Triazolam Negative Cutoff=100   Midazolam Negative Cutoff=100  Opiates Confirmation, Urine  Result Value Ref Range   Opiates Comment: Cutoff=300 ng/mL     Codeine Positive (A)       Codeine Confirm >3000 Cutoff=300 ng/mL     Morphine Positive (A)       Morphine Confirm >3000 Cutoff=300 ng/mL     Hydromorphone Negative Cutoff=300     Hydrocodone Comment:   Methadone Conf Gc/Ms  Result Value Ref Range   METHADONE Negative Cutoff=300

## 2016-12-27 NOTE — Patient Instructions (Signed)
Sacroiliac injection was performed today. A combination of a naming medicine plus a cortisone medicine was injected. The injection was done under x-ray guidance. This procedure has been performed to help reduce low back and buttocks pain as well as potentially hip pain. The duration of this injection is variable lasting from hours to  Months. It may repeated if needed. 

## 2016-12-27 NOTE — Progress Notes (Signed)

## 2016-12-31 ENCOUNTER — Telehealth: Payer: Self-pay

## 2016-12-31 NOTE — Telephone Encounter (Signed)
Patient called stating that she was in our office Monday 12/27/16 receiving injections. Patient is complaining of her neck hurting so bad that it is almost unbearable. She says that the side of her face, shoulder, and right arm keeps going numb and that it feels like someone is hammering on her neck bone. She states she has tried heating pads and heating rubs but it is not helping and that she doesn't know what to do. She also says that she has no one to take her to the hospital and to please call her back.

## 2016-12-31 NOTE — Telephone Encounter (Signed)
Left message on VM per DPR.  This would not be related to her back injection.  She will need to go to Urgent Care/ED or contact her PCP who has seen her for her chronic neck pain before.

## 2016-12-31 NOTE — Telephone Encounter (Deleted)
Patient called stating that she was in our office Monday 12/27/16 receiving injections. Patient is complaining of her neck hurting so bad that it is almost unbearable. She says that the side of her face, shoulder, and right arm keeps going numb and that it feels like someone is hammering on her neck bone. She states she has tried heating pads and heating rubs but it is not helping and that she doesn't know what to do. She also says that she has no one to take her to the hospital and to please call her back.

## 2016-12-31 NOTE — Telephone Encounter (Signed)
Charlene Allen called back and explaining that he is going to do injections o her neck in three weeks but the pain is so bad and her face and arm is going numb.  I explained that she MUST go to the Ed about this if her pain is so severe.  Dr Letta Pate is going to do a trigger point in her trapezius in three weeks, but the advice is still the same.

## 2017-01-10 ENCOUNTER — Other Ambulatory Visit: Payer: Self-pay | Admitting: Internal Medicine

## 2017-01-11 ENCOUNTER — Telehealth: Payer: Self-pay | Admitting: Family Medicine

## 2017-01-11 NOTE — Telephone Encounter (Signed)
Pt called to request a refill for  acetaminophen-codeine (TYLENOL #4) 300-60 MG tablet  This a Pt of Dr. Wynetta Emery, please follow up

## 2017-01-12 NOTE — Telephone Encounter (Signed)
Pt called back again waiting for her prescription, of Tylenol #4, can you please see if you can auth or not, please follow up

## 2017-01-12 NOTE — Telephone Encounter (Signed)
On 12/14/16 she received #120 Tylenol No. 4. with one refill from her PCP which should last her until 02/12/17.

## 2017-01-13 ENCOUNTER — Ambulatory Visit: Payer: Self-pay

## 2017-01-25 ENCOUNTER — Encounter: Payer: Self-pay | Admitting: Physical Medicine & Rehabilitation

## 2017-01-25 ENCOUNTER — Ambulatory Visit (HOSPITAL_BASED_OUTPATIENT_CLINIC_OR_DEPARTMENT_OTHER): Payer: Self-pay | Admitting: Physical Medicine & Rehabilitation

## 2017-01-25 VITALS — BP 145/90 | HR 78

## 2017-01-25 DIAGNOSIS — M533 Sacrococcygeal disorders, not elsewhere classified: Secondary | ICD-10-CM

## 2017-01-25 NOTE — Progress Notes (Signed)
Subjective:    Patient ID: Charlene Allen, female    DOB: 03-Jun-1955, 61 y.o.   MRN: [id number]  HPI Sacroiliac injection helped for about 4 days. "Very good for days". Here for trigger point injections. However, patient does not wish to have any today. She is disappointed that the injections don't last very long.  Pain Inventory Average Pain 7 Pain Right Now 8 My pain is constant, sharp and aching  In the last 24 hours, has pain interfered with the following? General activity 4 Relation with others 4 Enjoyment of life 2 What TIME of day is your pain at its worst? . Sleep (in general) Poor  Pain is worse with: walking, bending and standing Pain improves with: . Relief from Meds: 3  Mobility ability to climb steps?  yes do you drive?  no  Function I need assistance with the following:  household duties and shopping  Neuro/Psych numbness trouble walking  Prior Studies Any changes since last visit?  no  Physicians involved in your care Any changes since last visit?  no   Family History  Problem Relation Age of Onset  . COPD Mother   . Stroke Mother   . Heart disease Mother   . Hypertension Mother   . Cancer Sister 25       cervical cancer   . Stroke Maternal Grandmother    Social History   Social History  . Marital status: Married    Spouse name: N/A  . Number of children: N/A  . Years of education: N/A   Social History Main Topics  . Smoking status: Current Every Day Smoker    Packs/day: 1.00    Years: 40.00  . Smokeless tobacco: Never Used  . Alcohol use No  . Drug use: No  . Sexual activity: Not Currently    Birth control/ protection: None   Other Topics Concern  . Not on file   Social History Narrative  . No narrative on file   Past Surgical History:  Procedure Laterality Date  . ABDOMINAL AORTOGRAM N/A 07/12/2016   Procedure: Abdominal Aortogram;  Surgeon: Jettie Booze, MD;  Location: [facility];  Service:  Cardiovascular;  Laterality: N/A;  . HAND SURGERY    . LEFT HEART CATH AND CORONARY ANGIOGRAPHY N/A 07/12/2016   Procedure: Left Heart Cath and Coronary Angiography;  Surgeon: Jettie Booze, MD;  Location: [facility];  Service: Cardiovascular;  Laterality: N/A;  . TUBAL LIGATION     Past Medical History:  Diagnosis Date  . Hypertension Dx 2015  . Obesity   . PMB (postmenopausal bleeding)    There were no vitals taken for this visit.  Opioid Risk Score:   Fall Risk Score:  `1  Depression screen PHQ 2/9  Depression screen [facility] 2/9 12/14/2016 10/22/2016 09/17/2016 09/07/2016 08/16/2016 07/22/2016 05/17/2016  Decreased Interest 2 1 3 3 1 1 1   Down, Depressed, Hopeless 2 1 3 3 1 1 2   PHQ - 2 Score 4 2 6 6 2 2 3   Altered sleeping 3 1 - 3 1 1 3   Tired, decreased energy 3 2 - 3 2 3 3   Change in appetite 2 2 - 3 1 2 2   Feeling bad or failure about yourself  2 2 - 3 1 1 1   Trouble concentrating 1 1 - 2 0 1 1  Moving slowly or fidgety/restless 1 2 - 3 1 0 3  Suicidal thoughts 0 0 - 1 0 0  0  PHQ-9 Score 16 12 - 24 8 10 16   Difficult doing work/chores - - - - - - Somewhat difficult  Some recent data might be hidden     Review of Systems  Constitutional: Positive for unexpected weight change.  HENT: Negative.   Eyes: Negative.   Respiratory: Negative.   Cardiovascular: Negative.   Gastrointestinal: Negative.   Endocrine: Negative.   Genitourinary: Negative.   Musculoskeletal: Negative.   Skin: Negative.   Allergic/Immunologic: Negative.   Neurological: Negative.   Hematological: Negative.   Psychiatric/Behavioral: Negative.   All other systems reviewed and are negative.      Objective:   Physical Exam  Constitutional: She is oriented to person, place, and time. She appears well-developed and well-nourished.  HENT:  Head: Normocephalic and atraumatic.  Eyes: Pupils are equal, round, and reactive to light. Conjunctivae and EOM are normal.  Neurological: She is alert  and oriented to person, place, and time.  Psychiatric: She has a normal mood and affect.  Nursing note and vitals reviewed.   Tenderness over bilateral PSIS. Pain with internal and external rotation of the hip, but the pain is in the PSIS area and not in the groin. Negative straight leg raise. Motor strength is 5/5 bilateral hip flexors, knee extensors, ankle obstruction. Ambulated without assistive device. No evidence of toe drag or knee instability       Assessment & Plan:  1. Sacroiliac dysfunction, bilateral. We discussed that sacroiliac joint injections are helpful for diagnosing the problem but may not have a long-term relief. There is a radiofrequency procedure of the nerves to the sacroiliac joint that sometimes works, but not as reliably as the joint injections. We discussed other treatment options including chiropractic care, which she is not interested in pursuing. I have given her a handout of some piriformis stretching exercises as well as some adductor and hip extensor strengthening exercises.  She is aware that I'm not comfortable prescribing narcotic analgesics for her. She will follow-up with me on an as-needed basis. She has an appointment with her primary care physician

## 2017-01-25 NOTE — Patient Instructions (Signed)
Please refer to handout. I have given you and perform the exercises and is indicated  I will not have you follow up for another injection at this time.  Please follow-up with your primary physician.

## 2017-01-31 MED FILL — AMLODIPINE BESYLATE 10 MG T: 10 | 30 days supply | Qty: 30 | Fill #1

## 2017-01-31 MED FILL — ?ATORVASTATIN 20 MG TABLET: 20 | 30 days supply | Qty: 30 | Fill #1

## 2017-02-01 ENCOUNTER — Ambulatory Visit: Payer: Self-pay | Attending: Internal Medicine | Admitting: Internal Medicine

## 2017-02-01 ENCOUNTER — Encounter: Payer: Self-pay | Admitting: Internal Medicine

## 2017-02-01 VITALS — BP 148/85 | HR 73 | Temp 98.3°F | Resp 16 | Wt 189.2 lb

## 2017-02-01 DIAGNOSIS — M5441 Lumbago with sciatica, right side: Secondary | ICD-10-CM | POA: Insufficient documentation

## 2017-02-01 DIAGNOSIS — I10 Essential (primary) hypertension: Secondary | ICD-10-CM | POA: Insufficient documentation

## 2017-02-01 DIAGNOSIS — F32A Depression, unspecified: Secondary | ICD-10-CM

## 2017-02-01 DIAGNOSIS — Z7982 Long term (current) use of aspirin: Secondary | ICD-10-CM | POA: Insufficient documentation

## 2017-02-01 DIAGNOSIS — M5442 Lumbago with sciatica, left side: Secondary | ICD-10-CM | POA: Insufficient documentation

## 2017-02-01 DIAGNOSIS — G8929 Other chronic pain: Secondary | ICD-10-CM | POA: Insufficient documentation

## 2017-02-01 DIAGNOSIS — F329 Major depressive disorder, single episode, unspecified: Secondary | ICD-10-CM | POA: Insufficient documentation

## 2017-02-01 DIAGNOSIS — F17210 Nicotine dependence, cigarettes, uncomplicated: Secondary | ICD-10-CM | POA: Insufficient documentation

## 2017-02-01 DIAGNOSIS — M25511 Pain in right shoulder: Secondary | ICD-10-CM | POA: Insufficient documentation

## 2017-02-01 DIAGNOSIS — J449 Chronic obstructive pulmonary disease, unspecified: Secondary | ICD-10-CM | POA: Insufficient documentation

## 2017-02-01 DIAGNOSIS — Z79899 Other long term (current) drug therapy: Secondary | ICD-10-CM | POA: Insufficient documentation

## 2017-02-01 MED ORDER — ACETAMINOPHEN-CODEINE #4 300-60 MG PO TABS: 1.0000 | ORAL_TABLET | Freq: Four times a day (QID) | ORAL | 1 refills | Status: DC | PRN

## 2017-02-01 MED ORDER — DULOXETINE HCL 30 MG PO CPEP: 30.0000 mg | ORAL_CAPSULE | Freq: Every day | ORAL | 3 refills | Status: DC

## 2017-02-01 MED ORDER — HYDROCHLOROTHIAZIDE 12.5 MG PO TABS: 12.5000 mg | ORAL_TABLET | Freq: Every day | ORAL | 4 refills | Status: DC

## 2017-02-01 MED FILL — ?DULOXETINE HCL DR 30 MG CA: 30 MG | 30 days supply | Qty: 30 | Fill #0

## 2017-02-01 MED FILL — ?HYDROCHLOROTHIAZIDE 12.5MG: 12.5 | 30 days supply | Qty: 30 | Fill #0

## 2017-02-01 NOTE — Patient Instructions (Signed)
Stop Lexapro. Start Cymbalta 30 mg daily.

## 2017-02-01 NOTE — Progress Notes (Signed)
Patient ID: Charlene Allen, female    DOB: [birth year]  MRN: [id number]  CC: Follow-up   Subjective: Charlene Allen is a 61 y.o. female who presents for chronic ds management. Last seen 12/14/2016. Her concerns today include:  Hx of chronic neck/lower back pain on Tylenol #4, Lyrica, Valuim), HTN, tob, obesity, COPD, depression  1.  LBP:  Saw Dr. Letta Pate in follow-up since last visit.  -had 1 inj since last visit.  Effects lasted about 4 days.  She declined any further injections. -she .  "I don't need to talk with anyone about my pain.  All I do is talk about it with my family"  -no appt with neurosurg as yet  2. Depression: ot sure if it makes me "Ill like I want to snap people's head off."  -tried Amitriptyline in past, did not tolerate.  3.  HTN: Norvasc increased on last visit.  She has been compliant with both medications.  Systolic blood pressure running in the 140s-150s at home Patient Active Problem List   Diagnosis Date Noted  . Increased endometrial stripe thickness 09/08/2016  . Chronic neck pain 08/16/2016  . Chronic right shoulder pain 07/22/2016  . Morbid obesity due to excess calories ([place]) 06/20/2016  . COPD GOLD 0 still smoking  06/18/2016  . HPV test positive 10/13/2015  . Precordial chest pain 09/09/2015  . Mild diastolic dysfunction 88/41/6606  . Chronic diarrhea 08/13/2015  . Cigarette smoker 08/13/2015  . Chronic back pain 03/15/2013  . Essential hypertension, benign 03/15/2013  . History of substance abuse 03/15/2013     Current Outpatient Medications on File Prior to Visit  Medication Sig Dispense Refill  . acetaminophen-codeine (TYLENOL #4) 300-60 MG tablet Take 1 tablet by mouth every 6 (six) hours as needed for moderate pain. 120 tablet 1  . albuterol (PROVENTIL HFA;VENTOLIN HFA) 108 (90 Base) MCG/ACT inhaler Inhale 2 puffs into the lungs every 6 (six) hours as needed for wheezing or shortness of breath. 1 Inhaler 3  . amLODipine (NORVASC)  10 MG tablet Take 1 tablet (10 mg total) by mouth daily. 90 tablet 2  . aspirin 81 MG tablet Take 1 tablet (81 mg total) by mouth daily. 90 tablet 3  . atorvastatin (LIPITOR) 20 MG tablet Take 1 tablet (20 mg total) by mouth daily. 90 tablet 1  . budesonide-formoterol (SYMBICORT) 80-4.5 MCG/ACT inhaler Take 2 puffs first thing in am and then another 2 puffs about 12 hours later. (Patient taking differently: Inhale 1 puff into the lungs 4 (four) times daily. ) 1 Inhaler 11  . escitalopram (LEXAPRO) 10 MG tablet 1/2 tab daily x 2 wks then 1 tab daily PO 30 tablet 1  . ibuprofen (ADVIL,MOTRIN) 200 MG tablet Take 400 mg by mouth daily as needed for moderate pain.    Marland Kitchen losartan (COZAAR) 100 MG tablet Take 1 tablet (100 mg total) by mouth daily. 90 tablet 3  . methocarbamol (ROBAXIN) 500 MG tablet Take 1 tablet (500 mg total) by mouth every 8 (eight) hours as needed for muscle spasms. 60 tablet 1  . Multiple Vitamin (MULTIVITAMIN WITH MINERALS) TABS tablet Take 1 tablet by mouth daily.    . nicotine (NICODERM CQ - DOSED IN MG/24 HOURS) 14 mg/24hr patch Place 1 patch (14 mg total) onto the skin daily. 28 patch 0  . nitroGLYCERIN (NITROSTAT) 0.4 MG SL tablet Place 1 tablet (0.4 mg total) under the tongue every 5 (five) minutes as needed for chest pain. If you need more  than 2 doses call 911 20 tablet 1  . pregabalin (LYRICA) 50 MG capsule Take 1 capsule (50 mg total) by mouth 3 (three) times daily. For PASS 90 capsule 2   No current facility-administered medications on file prior to visit.     No Known Allergies  Social History   Socioeconomic History  . Marital status: Married    Spouse name: Not on file  . Number of children: Not on file  . Years of education: Not on file  . Highest education level: Not on file  Social Needs  . Financial resource strain: Not on file  . Food insecurity - worry: Not on file  . Food insecurity - inability: Not on file  . Transportation needs - medical: Not on  file  . Transportation needs - non-medical: Not on file  Occupational History  . Not on file  Tobacco Use  . Smoking status: Current Every Day Smoker    Packs/day: 1.00    Years: 40.00    Pack years: 40.00  . Smokeless tobacco: Never Used  Substance and Sexual Activity  . Alcohol use: No    Alcohol/week: 0.0 oz  . Drug use: No  . Sexual activity: Not Currently    Birth control/protection: None  Other Topics Concern  . Not on file  Social History Narrative  . Not on file    Family History  Problem Relation Age of Onset  . COPD Mother   . Stroke Mother   . Heart disease Mother   . Hypertension Mother   . Cancer Sister 25       cervical cancer   . Stroke Maternal Grandmother     Past Surgical History:  Procedure Laterality Date  . HAND SURGERY    . TUBAL LIGATION      ROS: Review of Systems Negative except as stated above PHYSICAL EXAM: BP (!) 148/85   Pulse 73   Temp 98.3 F (36.8 C) (Oral)   Resp 16   Wt 189 lb 3.2 oz (85.8 kg)   SpO2 95%   BMI 33.52 kg/m   Repeat blood pressure today 150/70 Physical Exam  General appearance - alert, well appearing, and appears mildly uncomfortable sitting in the chair  Mental status - alert, oriented to person, place, and time, normal mood, behavior, speech, dress, motor activity, and thought processes   Depression screen [facility] 2/9 12/14/2016 10/22/2016 09/17/2016 09/07/2016 08/16/2016  Decreased Interest 2 1 3 3 1   Down, Depressed, Hopeless 2 1 3 3 1   PHQ - 2 Score 4 2 6 6 2   Altered sleeping 3 1 - 3 1  Tired, decreased energy 3 2 - 3 2  Change in appetite 2 2 - 3 1  Feeling bad or failure about yourself  2 2 - 3 1  Trouble concentrating 1 1 - 2 0  Moving slowly or fidgety/restless 1 2 - 3 1  Suicidal thoughts 0 0 - 1 0  PHQ-9 Score 16 12 - 24 8  Difficult doing work/chores - - - - -  Some recent data might be hidden    ASSESSMENT AND PLAN: 1. Chronic bilateral low back pain with bilateral sciatica Stop  Lexapro Start Cymbalta. Encourage patient to consider seeing the behavioral health specialist as a multimodality approach to chronic pain is better than relying on medications alone.  She is not interested in following through on that - acetaminophen-codeine (TYLENOL #4) 300-60 MG tablet; Take 1 tablet every 6 (six) hours as needed  by mouth for moderate pain.  Dispense: 120 tablet; Refill: 1 To fill on or after 02/11/2017 -Pain management agreement signed 9/201 8 NCCSRS reviewed  2. Essential hypertension Not at goal. Add low-dose HCTZ - hydrochlorothiazide (HYDRODIURIL) 12.5 MG tablet; Take 1 tablet (12.5 mg total) daily by mouth.  Dispense: 30 tablet; Refill: 4  3. Depression, unspecified depression type Stop Lexapro.  Will give a trial of Cymbalta. - DULoxetine (CYMBALTA) 30 MG capsule; Take 1 capsule (30 mg total) daily by mouth.  Dispense: 30 capsule; Refill: 3  Patient was given the opportunity to ask questions.  Patient verbalized understanding of the plan and was able to repeat key elements of the plan.   No orders of the defined types were placed in this encounter.    Requested Prescriptions    No prescriptions requested or ordered in this encounter    No Follow-up on file.  Karle Plumber, MD, FACP

## 2017-03-10 MED FILL — ?ATORVASTATIN 20 MG TABLET: 20 | 30 days supply | Qty: 30 | Fill #2

## 2017-03-10 MED FILL — AMLODIPINE BESYLATE 10 MG T: 10 | 30 days supply | Qty: 30 | Fill #2

## 2017-03-12 ENCOUNTER — Other Ambulatory Visit: Payer: Self-pay | Admitting: Internal Medicine

## 2017-03-12 DIAGNOSIS — M5441 Lumbago with sciatica, right side: Principal | ICD-10-CM

## 2017-03-12 DIAGNOSIS — G8929 Other chronic pain: Secondary | ICD-10-CM

## 2017-03-12 DIAGNOSIS — M5442 Lumbago with sciatica, left side: Principal | ICD-10-CM

## 2017-03-24 ENCOUNTER — Ambulatory Visit: Payer: Self-pay | Attending: Family Medicine | Admitting: Family Medicine

## 2017-03-24 ENCOUNTER — Encounter: Payer: Self-pay | Admitting: Family Medicine

## 2017-03-24 VITALS — BP 153/95 | HR 66 | Temp 97.9°F | Resp 18 | Ht 63.0 in | Wt 190.0 lb

## 2017-03-24 DIAGNOSIS — Z7982 Long term (current) use of aspirin: Secondary | ICD-10-CM | POA: Insufficient documentation

## 2017-03-24 DIAGNOSIS — M25619 Stiffness of unspecified shoulder, not elsewhere classified: Secondary | ICD-10-CM

## 2017-03-24 DIAGNOSIS — M542 Cervicalgia: Secondary | ICD-10-CM

## 2017-03-24 DIAGNOSIS — G8929 Other chronic pain: Secondary | ICD-10-CM | POA: Insufficient documentation

## 2017-03-24 DIAGNOSIS — M5412 Radiculopathy, cervical region: Secondary | ICD-10-CM | POA: Insufficient documentation

## 2017-03-24 DIAGNOSIS — M4802 Spinal stenosis, cervical region: Secondary | ICD-10-CM | POA: Insufficient documentation

## 2017-03-24 DIAGNOSIS — Z79899 Other long term (current) drug therapy: Secondary | ICD-10-CM | POA: Insufficient documentation

## 2017-03-24 MED ORDER — KETOROLAC TROMETHAMINE 60 MG/2ML IM SOLN
60.0000 mg | Freq: Once | INTRAMUSCULAR | Status: AC
  Administered 2017-03-24: 60 mg via INTRAMUSCULAR

## 2017-03-24 MED ORDER — PREDNISONE 10 MG PO TABS: ORAL_TABLET | ORAL | 0 refills | Status: DC

## 2017-03-24 MED ORDER — PREGABALIN 50 MG PO CAPS: 50.0000 mg | ORAL_CAPSULE | Freq: Three times a day (TID) | ORAL | 2 refills | Status: DC

## 2017-03-24 MED FILL — predniSONE 10 MG TABS: 10 | 7 days supply | Qty: 21 | Fill #0

## 2017-03-24 NOTE — Progress Notes (Signed)
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 Patient stated her neck pain is a burning sensation, leg cramps, and someone hammering on her neck.

## 2017-03-24 NOTE — Progress Notes (Signed)
Subjective:  Patient ID: Charlene Allen, female    DOB: 10-15-1955  Age: 61 y.o. MRN: [id number]  CC: Neck Pain   HPI Charlene Allen presents for complains of chronic pain. She is accompanied by her daughter.Reports symptoms gradually worsening since Saturday. Pain is described as  aching and burning, and is severe .  Associated symptoms include: radiation down the right arm and upper chest wall, with paresthesias to the right thumb, mild posterior neck swelling with tenderness. She denies any weakness but does report limited ROM of the right arm. She denies any history of known injury. 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 She reports taking tylenol #3 for symptoms with minimal relief. She is not currently taking Lyrica. Workup in the past has included MRI of the cervical spine 11/ which showed the following:  Progressing advanced degenerative disc disease and right C3-4 facet C3-4 to C5-6 canal stenosis with cord flattening, primarily discogenic. Bilateral foraminal impingement at the same levels which was more severe at C3-4. Neurosurgery referral placed in the past, patient has not seen specialist yet. Elevated PHQ9 patient reports related to current physical symptoms. She declines any SI/HI. History of Cymbalta use. She declines speaking with the LCSW, any medications, or referrals at this time.   Outpatient Medications Prior to Visit  Medication Sig Dispense Refill  . acetaminophen-codeine (TYLENOL #4) 300-60 MG tablet Take 1 tablet every 6 (six) hours as needed by mouth for moderate pain. 120 tablet 1  . albuterol (PROVENTIL HFA;VENTOLIN HFA) 108 (90 Base) MCG/ACT inhaler Inhale 2 puffs into the lungs every 6 (six) hours as needed for wheezing or shortness of breath. 1 Inhaler 3  . amLODipine (NORVASC) 10 MG tablet Take 1 tablet (10 mg total) by mouth daily. 90 tablet 2  . aspirin 81 MG tablet Take 1 tablet (81 mg total) by mouth daily. 90 tablet 3  . atorvastatin (LIPITOR) 20  MG tablet Take 1 tablet (20 mg total) by mouth daily. 90 tablet 1  . hydrochlorothiazide (HYDRODIURIL) 12.5 MG tablet Take 1 tablet (12.5 mg total) daily by mouth. 30 tablet 4  . ibuprofen (ADVIL,MOTRIN) 200 MG tablet Take 400 mg by mouth daily as needed for moderate pain.    Marland Kitchen losartan (COZAAR) 100 MG tablet Take 1 tablet (100 mg total) by mouth daily. 90 tablet 3  . Multiple Vitamin (MULTIVITAMIN WITH MINERALS) TABS tablet Take 1 tablet by mouth daily.    . nitroGLYCERIN (NITROSTAT) 0.4 MG SL tablet Place 1 tablet (0.4 mg total) under the tongue every 5 (five) minutes as needed for chest pain. If you need more than 2 doses call 911 20 tablet 1  . budesonide-formoterol (SYMBICORT) 80-4.5 MCG/ACT inhaler Take 2 puffs first thing in am and then another 2 puffs about 12 hours later. (Patient not taking: Reported on 03/24/2017) 1 Inhaler 11  . DULoxetine (CYMBALTA) 30 MG capsule Take 1 capsule (30 mg total) daily by mouth. (Patient not taking: Reported on 03/24/2017) 30 capsule 3  . escitalopram (LEXAPRO) 10 MG tablet 1/2 tab daily x 2 wks then 1 tab daily PO (Patient not taking: Reported on 03/24/2017) 30 tablet 1  . methocarbamol (ROBAXIN) 500 MG tablet Take 1 tablet (500 mg total) by mouth every 8 (eight) hours as needed for muscle spasms. (Patient not taking: Reported on 03/24/2017) 60 tablet 1  . nicotine (NICODERM CQ - DOSED IN MG/24 HOURS) 14 mg/24hr patch Place 1 patch (14 mg total) onto the skin daily. (Patient not taking:  Reported on 03/24/2017) 28 patch 0  . pregabalin (LYRICA) 50 MG capsule Take 1 capsule (50 mg total) by mouth 3 (three) times daily. For PASS (Patient not taking: Reported on 03/24/2017) 90 capsule 2   No facility-administered medications prior to visit.     ROS Review of Systems  Constitutional: Negative.   Respiratory: Negative.   Cardiovascular: Negative.   Musculoskeletal: Positive for arthralgias, myalgias and neck pain.  Neurological:       Parathesias         Objective:  BP (!) 153/95 (BP Location: Left Arm, Patient Position: Sitting, Cuff Size: Normal)   Pulse 66   Temp 97.9 F (36.6 C) (Oral)   Resp 18   Ht 5\' 3"  (1.6 m)   Wt 190 lb (86.2 kg)   SpO2 96%   BMI 33.66 kg/m   BP/Weight 03/24/2017 02/01/2017 61/44/3154  Systolic BP 008 676 195  Diastolic BP 95 85 90  Wt. (Lbs) 190 189.2 -  BMI 33.66 33.52 -     Physical Exam  Constitutional: She appears well-developed and well-nourished.  Neck: No JVD present.  Cardiovascular: Normal rate, regular rhythm, normal heart sounds and intact distal pulses.  Pulmonary/Chest: Effort normal and breath sounds normal.  Musculoskeletal:       Right shoulder: She exhibits decreased range of motion (Decreased ROM with hyperextension.) and pain.       Cervical back: She exhibits tenderness, swelling (mild) and pain. She exhibits normal range of motion.       Right hand: Decreased strength (4/5 motor strength with hand grip) noted.  Skin: Skin is warm and dry.  Psychiatric:  Tearful.  Nursing note and vitals reviewed.   Depression screen [facility] 2/9 03/24/2017 12/14/2016 10/22/2016  Decreased Interest 2 2 1   Down, Depressed, Hopeless 2 2 1   PHQ - 2 Score 4 4 2   Altered sleeping 2 3 1   Tired, decreased energy 2 3 2   Change in appetite 2 2 2   Feeling bad or failure about yourself  2 2 2   Trouble concentrating 2 1 1   Moving slowly or fidgety/restless 3 1 2   Suicidal thoughts 0 0 0  PHQ-9 Score 17 16 12   Difficult doing work/chores - - -  Some recent data might be hidden    GAD 7 : Generalized Anxiety Score 03/24/2017 12/14/2016 10/22/2016 09/07/2016  Nervous, Anxious, on Edge 1 1 1 1   Control/stop worrying 0 2 1 1   Worry too much - different things 0 2 1 1   Trouble relaxing 3 1 1 2   Restless 1 1 1 1   Easily annoyed or irritable 0 1 1 1   Afraid - awful might happen 0 2 1 1   Total GAD 7 Score 5 10 7 8        Assessment & Plan:   1. Cervical radiculopathy Urgent referral  placed to neurosurgery. Patient given ED precautions. Consulted with patients PCP, Toradol injection given. - pregabalin (LYRICA) 50 MG capsule; Take 1 capsule (50 mg total) by mouth 3 (three) times daily. For PASS  Dispense: 90 capsule; Refill: 2 - ketorolac (TORADOL) injection 60 mg - predniSONE (DELTASONE) 10 MG tablet; DAY 1: TAKE 6 TABLETS BY MOUTH WITH MEAL; DAY 2: TAKE 5 TABLETS; DAY 3: TAKE 4 TABLETS; DAY 4: TAKE 3 TABLETS; DAY 5: TAKE 2 TABLETS; DAY 6: TAKE 1 TABLET.  Dispense: 21 tablet; Refill: 0 - Ambulatory referral to Neurosurgery  2. Limited range of motion (ROM) of shoulder Urgent referral placed to neurosurgery. -  pregabalin (LYRICA) 50 MG capsule; Take 1 capsule (50 mg total) by mouth 3 (three) times daily. For PASS  Dispense: 90 capsule; Refill: 2 - Ambulatory referral to Neurosurgery  3. Chronic neck pain  - pregabalin (LYRICA) 50 MG capsule; Take 1 capsule (50 mg total) by mouth 3 (three) times daily. For PASS  Dispense: 90 capsule; Refill: 2 - ketorolac (TORADOL) injection 60 mg - predniSONE (DELTASONE) 10 MG tablet; DAY 1: TAKE 6 TABLETS BY MOUTH WITH MEAL; DAY 2: TAKE 5 TABLETS; DAY 3: TAKE 4 TABLETS; DAY 4: TAKE 3 TABLETS; DAY 5: TAKE 2 TABLETS; DAY 6: TAKE 1 TABLET.  Dispense: 21 tablet; Refill: 0 - Ambulatory referral to Neurosurgery     Follow-up: Return in about 1 month (around 04/24/2017) for Cervical radiculopathy .   Alfonse Spruce FNP

## 2017-03-24 NOTE — Patient Instructions (Signed)
If symptoms worsen Go to the Emergency Department.  Cervical Radiculopathy Cervical radiculopathy means that a nerve in the neck is pinched or bruised. This can cause pain or loss of feeling (numbness) that runs from your neck to your arm and fingers. Follow these instructions at home: Managing pain  Take over-the-counter and prescription medicines only as told by your doctor.  If directed, put ice on the injured or painful area. ? Put ice in a plastic bag. ? Place a towel between your skin and the bag. ? Leave the ice on for 20 minutes, 2-3 times per day.  If ice does not help, you can try using heat. Take a warm shower or warm bath, or use a heat pack as told by your doctor.  You may try a gentle neck and shoulder massage. Activity  Rest as needed. Follow instructions from your doctor about any activities to avoid.  Do exercises as told by your doctor or physical therapist. General instructions  If you were given a soft collar, wear it as told by your doctor.  Use a flat pillow when you sleep.  Keep all follow-up visits as told by your doctor. This is important. Contact a doctor if:  Your condition does not improve with treatment. Get help right away if:  Your pain gets worse and is not controlled with medicine.  You lose feeling or feel weak in your hand, arm, face, or leg.  You have a fever.  You have a stiff neck.  You cannot control when you poop or pee (have incontinence).  You have trouble with walking, balance, or talking. This information is not intended to replace advice given to you by your health care provider. Make sure you discuss any questions you have with your health care provider. Document Released: 03/04/2011 Document Revised: 08/21/2015 Document Reviewed: 05/09/2014 Elsevier Interactive Patient Education  [employer].

## 2017-04-04 ENCOUNTER — Telehealth: Payer: Self-pay | Admitting: Internal Medicine

## 2017-04-04 DIAGNOSIS — M5441 Lumbago with sciatica, right side: Principal | ICD-10-CM

## 2017-04-04 DIAGNOSIS — M5442 Lumbago with sciatica, left side: Principal | ICD-10-CM

## 2017-04-04 DIAGNOSIS — G8929 Other chronic pain: Secondary | ICD-10-CM

## 2017-04-04 MED ORDER — ACETAMINOPHEN-CODEINE #4 300-60 MG PO TABS: 1.0000 | ORAL_TABLET | Freq: Four times a day (QID) | ORAL | 1 refills | Status: DC | PRN

## 2017-04-04 NOTE — Telephone Encounter (Signed)
Contacted pt and made aware of rx being ready for pick up pt is aware that this is a one time early refill and she can fill it on 04/07/2017.

## 2017-04-04 NOTE — Telephone Encounter (Signed)
NN[facility]RS reviewed. Last Ty#4 RF 03/12/2017.  Will allow early refill basde on recent exacerbation of neck pain.

## 2017-04-04 NOTE — Telephone Encounter (Signed)
Patient called requesting tylenol #4. Please fu

## 2017-04-13 ENCOUNTER — Other Ambulatory Visit: Payer: Self-pay

## 2017-04-13 DIAGNOSIS — M25619 Stiffness of unspecified shoulder, not elsewhere classified: Secondary | ICD-10-CM

## 2017-04-13 DIAGNOSIS — G8929 Other chronic pain: Secondary | ICD-10-CM

## 2017-04-13 DIAGNOSIS — M542 Cervicalgia: Principal | ICD-10-CM

## 2017-04-13 DIAGNOSIS — M5412 Radiculopathy, cervical region: Secondary | ICD-10-CM

## 2017-04-13 MED ORDER — PREGABALIN 50 MG PO CAPS: 50.0000 mg | ORAL_CAPSULE | Freq: Three times a day (TID) | ORAL | 1 refills | Status: DC

## 2017-04-18 MED FILL — AMLODIPINE BESYLATE 10 MG T: 10 | 30 days supply | Qty: 30 | Fill #3

## 2017-04-18 MED FILL — ?ATORVASTATIN 20MG TABLET: 20 | 30 days supply | Qty: 30 | Fill #0

## 2017-04-19 ENCOUNTER — Ambulatory Visit: Payer: Self-pay | Admitting: Internal Medicine

## 2017-04-26 ENCOUNTER — Ambulatory Visit: Payer: Self-pay | Attending: Internal Medicine | Admitting: Internal Medicine

## 2017-04-26 ENCOUNTER — Encounter: Payer: Self-pay | Admitting: Internal Medicine

## 2017-04-26 VITALS — BP 137/79 | HR 70 | Temp 98.1°F | Resp 16 | Wt 191.6 lb

## 2017-04-26 DIAGNOSIS — G8929 Other chronic pain: Secondary | ICD-10-CM

## 2017-04-26 DIAGNOSIS — F17200 Nicotine dependence, unspecified, uncomplicated: Secondary | ICD-10-CM

## 2017-04-26 DIAGNOSIS — M25511 Pain in right shoulder: Secondary | ICD-10-CM | POA: Insufficient documentation

## 2017-04-26 DIAGNOSIS — Z79899 Other long term (current) drug therapy: Secondary | ICD-10-CM | POA: Insufficient documentation

## 2017-04-26 DIAGNOSIS — F329 Major depressive disorder, single episode, unspecified: Secondary | ICD-10-CM | POA: Insufficient documentation

## 2017-04-26 DIAGNOSIS — I10 Essential (primary) hypertension: Secondary | ICD-10-CM | POA: Insufficient documentation

## 2017-04-26 DIAGNOSIS — Z7951 Long term (current) use of inhaled steroids: Secondary | ICD-10-CM | POA: Insufficient documentation

## 2017-04-26 DIAGNOSIS — R059 Cough, unspecified: Secondary | ICD-10-CM

## 2017-04-26 DIAGNOSIS — M5412 Radiculopathy, cervical region: Secondary | ICD-10-CM

## 2017-04-26 DIAGNOSIS — R05 Cough: Secondary | ICD-10-CM

## 2017-04-26 DIAGNOSIS — M5441 Lumbago with sciatica, right side: Secondary | ICD-10-CM

## 2017-04-26 DIAGNOSIS — J449 Chronic obstructive pulmonary disease, unspecified: Secondary | ICD-10-CM | POA: Insufficient documentation

## 2017-04-26 DIAGNOSIS — M5442 Lumbago with sciatica, left side: Secondary | ICD-10-CM

## 2017-04-26 DIAGNOSIS — Z7982 Long term (current) use of aspirin: Secondary | ICD-10-CM | POA: Insufficient documentation

## 2017-04-26 DIAGNOSIS — F17210 Nicotine dependence, cigarettes, uncomplicated: Secondary | ICD-10-CM | POA: Insufficient documentation

## 2017-04-26 DIAGNOSIS — Z6833 Body mass index (BMI) 33.0-33.9, adult: Secondary | ICD-10-CM | POA: Insufficient documentation

## 2017-04-26 MED ORDER — METHOCARBAMOL 500 MG PO TABS
500.0000 mg | ORAL_TABLET | Freq: Three times a day (TID) | ORAL | 1 refills | Status: DC | PRN
Start: 2017-04-26 — End: 2017-04-26

## 2017-04-26 MED ORDER — METHOCARBAMOL 500 MG PO TABS: 500.0000 mg | ORAL_TABLET | Freq: Three times a day (TID) | ORAL | 1 refills | Status: DC | PRN

## 2017-04-26 MED ORDER — ALBUTEROL SULFATE HFA 108 (90 BASE) MCG/ACT IN AERS: 2.0000 | INHALATION_SPRAY | Freq: Four times a day (QID) | RESPIRATORY_TRACT | 3 refills | Status: AC | PRN

## 2017-04-26 MED FILL — METHOCARBAMOL 500 MG TABLET: 500 | 20 days supply | Qty: 60 | Fill #0

## 2017-04-26 MED FILL — !VENTOLIN HFA INHALER: 108 (90 BAS | 25 days supply | Qty: 18 | Fill #0

## 2017-04-26 NOTE — Progress Notes (Signed)
Patient ID: Charlene Allen, female    DOB: October 28, 1955  MRN: [id number]  CC: Follow-up (neck and back) and Hypertension   Subjective: Charlene Allen is a 62 y.o. female who presents for UC visit Her concerns today include:  Hx of chronic neck/lower back pain on Tylenol #4, Lyrica), HTN, tob, obesity, COPD, depression  Seen by nurse practitioner several weeks ago with acute flare and neck pain.  Given steroid taper and a Toradol shot.  Referred to neurosurgeon but was told that she will need $200 for the consultation.  Patient is uninsured and unemployed.  She cannot afford this.  She reports that her neck is a little better.  She would still like to see a surgeon about her neck but more so for her lower back. Has [facility] discont/OC Never got Lyrica.  Waiting for this to come in through the patient assistance program Cymbalta - makes her feel tire.  She takes it at nights  Requested refill on albuterol inhaler.  Patient Active Problem List   Diagnosis Date Noted  . Increased endometrial stripe thickness 09/08/2016  . Chronic neck pain 08/16/2016  . Chronic right shoulder pain 07/22/2016  . Morbid obesity due to excess calories ([place]) 06/20/2016  . COPD GOLD 0 still smoking  06/18/2016  . HPV test positive 10/13/2015  . Precordial chest pain 09/09/2015  . Mild diastolic dysfunction 84/69/6295  . Chronic diarrhea 08/13/2015  . Cigarette smoker 08/13/2015  . Chronic back pain 03/15/2013  . Essential hypertension, benign 03/15/2013  . History of substance abuse 03/15/2013     Current Outpatient Medications on File Prior to Visit  Medication Sig Dispense Refill  . acetaminophen-codeine (TYLENOL #4) 300-60 MG tablet Take 1 tablet by mouth every 6 (six) hours as needed for moderate pain. 120 tablet 1  . amLODipine (NORVASC) 10 MG tablet Take 1 tablet (10 mg total) by mouth daily. 90 tablet 2  . aspirin 81 MG tablet Take 1 tablet (81 mg total) by mouth daily. 90 tablet 3  .  atorvastatin (LIPITOR) 20 MG tablet Take 1 tablet (20 mg total) by mouth daily. 90 tablet 1  . hydrochlorothiazide (HYDRODIURIL) 12.5 MG tablet Take 1 tablet (12.5 mg total) daily by mouth. 30 tablet 4  . ibuprofen (ADVIL,MOTRIN) 200 MG tablet Take 400 mg by mouth daily as needed for moderate pain.    Marland Kitchen losartan (COZAAR) 100 MG tablet Take 1 tablet (100 mg total) by mouth daily. 90 tablet 3  . nitroGLYCERIN (NITROSTAT) 0.4 MG SL tablet Place 1 tablet (0.4 mg total) under the tongue every 5 (five) minutes as needed for chest pain. If you need more than 2 doses call 911 20 tablet 1  . pregabalin (LYRICA) 50 MG capsule Take 1 capsule (50 mg total) by mouth 3 (three) times daily. For PASS 270 capsule 1  . budesonide-formoterol (SYMBICORT) 80-4.5 MCG/ACT inhaler Take 2 puffs first thing in am and then another 2 puffs about 12 hours later. (Patient not taking: Reported on 03/24/2017) 1 Inhaler 11  . DULoxetine (CYMBALTA) 30 MG capsule Take 1 capsule (30 mg total) daily by mouth. (Patient not taking: Reported on 03/24/2017) 30 capsule 3  . Multiple Vitamin (MULTIVITAMIN WITH MINERALS) TABS tablet Take 1 tablet by mouth daily.    . nicotine (NICODERM CQ - DOSED IN MG/24 HOURS) 14 mg/24hr patch Place 1 patch (14 mg total) onto the skin daily. (Patient not taking: Reported on 03/24/2017) 28 patch 0   No current facility-administered medications on file  prior to visit.     No Known Allergies  Social History   Socioeconomic History  . Marital status: Married    Spouse name: Not on file  . Number of children: Not on file  . Years of education: Not on file  . Highest education level: Not on file  Social Needs  . Financial resource strain: Not on file  . Food insecurity - worry: Not on file  . Food insecurity - inability: Not on file  . Transportation needs - medical: Not on file  . Transportation needs - non-medical: Not on file  Occupational History  . Not on file  Tobacco Use  . Smoking status:  Current Every Day Smoker    Packs/day: 1.00    Years: 40.00    Pack years: 40.00  . Smokeless tobacco: Never Used  Substance and Sexual Activity  . Alcohol use: No    Alcohol/week: 0.0 oz  . Drug use: No  . Sexual activity: Not Currently    Birth control/protection: None  Other Topics Concern  . Not on file  Social History Narrative  . Not on file    Family History  Problem Relation Age of Onset  . COPD Mother   . Stroke Mother   . Heart disease Mother   . Hypertension Mother   . Cancer Sister 25       cervical cancer   . Stroke Maternal Grandmother     Past Surgical History:  Procedure Laterality Date  . ABDOMINAL AORTOGRAM N/A 07/12/2016   Procedure: Abdominal Aortogram;  Surgeon: Jettie Booze, MD;  Location: [facility];  Service: Cardiovascular;  Laterality: N/A;  . HAND SURGERY    . LEFT HEART CATH AND CORONARY ANGIOGRAPHY N/A 07/12/2016   Procedure: Left Heart Cath and Coronary Angiography;  Surgeon: Jettie Booze, MD;  Location: [facility];  Service: Cardiovascular;  Laterality: N/A;  . TUBAL LIGATION      ROS: Review of Systems Negative except as stated above PHYSICAL EXAM: BP 137/79   Pulse 70   Temp 98.1 F (36.7 C) (Oral)   Resp 16   Wt 191 lb 9.6 oz (86.9 kg)   SpO2 95%   BMI 33.94 kg/m   Physical Exam  General appearance - alert, well appearing, and in no distress Mental status - alert, oriented to person, place, and time  ASSESSMENT AND PLAN: 1. Chronic bilateral low back pain with bilateral sciatica Since she has the orange card and [facility] discount card, we will try to get her in with Dr. [name] Patient will check at the pharmacy today to see if the Lyrica came in as yet through the patient assistance program - Ambulatory referral to Orthopedic Surgery - methocarbamol (ROBAXIN) 500 MG tablet; Take 1 tablet (500 mg total) by mouth every 8 (eight) hours as needed for muscle spasms.  Dispense: 60 tablet; Refill: 1  2.  Cervical radiculopathy - Ambulatory referral to Orthopedic Surgery - methocarbamol (ROBAXIN) 500 MG tablet; Take 1 tablet (500 mg total) by mouth every 8 (eight) hours as needed for muscle spasms.  Dispense: 60 tablet; Refill: 1  3. Cough - albuterol (PROVENTIL HFA;VENTOLIN HFA) 108 (90 Base) MCG/ACT inhaler; Inhale 2 puffs into the lungs every 6 (six) hours as needed for wheezing or shortness of breath.  Dispense: 1 Inhaler; Refill: 3  Patient was given the opportunity to ask questions.  Patient verbalized understanding of the plan and was able to repeat key elements of the  plan.   Orders Placed This Encounter  Procedures  . Ambulatory referral to Orthopedic Surgery     Requested Prescriptions   Signed Prescriptions Disp Refills  . methocarbamol (ROBAXIN) 500 MG tablet 60 tablet 1    Sig: Take 1 tablet (500 mg total) by mouth every 8 (eight) hours as needed for muscle spasms.  Marland Kitchen albuterol (PROVENTIL HFA;VENTOLIN HFA) 108 (90 Base) MCG/ACT inhaler 1 Inhaler 3    Sig: Inhale 2 puffs into the lungs every 6 (six) hours as needed for wheezing or shortness of breath.    Return in about 3 months (around 07/25/2017).  Karle Plumber, MD, FACP

## 2017-04-26 NOTE — Progress Notes (Signed)
Pt states most of the pain is coming from her back today  Pt is needing a refill on her inhaler

## 2017-05-04 ENCOUNTER — Other Ambulatory Visit: Payer: Self-pay | Admitting: Internal Medicine

## 2017-05-04 DIAGNOSIS — M5441 Lumbago with sciatica, right side: Principal | ICD-10-CM

## 2017-05-04 DIAGNOSIS — G8929 Other chronic pain: Secondary | ICD-10-CM

## 2017-05-04 DIAGNOSIS — M5442 Lumbago with sciatica, left side: Principal | ICD-10-CM

## 2017-05-11 ENCOUNTER — Ambulatory Visit: Payer: Self-pay

## 2017-05-24 ENCOUNTER — Ambulatory Visit (INDEPENDENT_AMBULATORY_CARE_PROVIDER_SITE_OTHER): Payer: Self-pay | Admitting: Orthopaedic Surgery

## 2017-05-27 MED FILL — ?ATORVASTATIN 20 MG TABLET: 20 | 30 days supply | Qty: 30 | Fill #1

## 2017-05-27 MED FILL — AMLODIPINE BESYLATE 10 MG T: 10 | 30 days supply | Qty: 30 | Fill #4

## 2017-05-30 ENCOUNTER — Telehealth: Payer: Self-pay | Admitting: Internal Medicine

## 2017-05-30 ENCOUNTER — Ambulatory Visit: Payer: Self-pay | Attending: Internal Medicine

## 2017-05-30 DIAGNOSIS — M5442 Lumbago with sciatica, left side: Principal | ICD-10-CM

## 2017-05-30 DIAGNOSIS — M5441 Lumbago with sciatica, right side: Principal | ICD-10-CM

## 2017-05-30 DIAGNOSIS — G8929 Other chronic pain: Secondary | ICD-10-CM

## 2017-05-30 MED ORDER — ACETAMINOPHEN-CODEINE #4 300-60 MG PO TABS: 1.0000 | ORAL_TABLET | Freq: Four times a day (QID) | ORAL | 2 refills | Status: DC | PRN

## 2017-05-30 NOTE — Telephone Encounter (Signed)
Pt called to request a refill for acetaminophen-codeine (TYLENOL #4) 300-60 MG tablet Please sent to [facility], on [address] [place], Please follow up If possible can get the print today this afternoon since she has an appt here at 1;30 with financial

## 2017-05-30 NOTE — Telephone Encounter (Signed)
Pt requesting RF on Tylenol # 4.  Last seen 04/26/2017. NCCSRS reviewed and is appropriate.  RF given for 120 tabs with 2 RFs

## 2017-06-17 ENCOUNTER — Ambulatory Visit (INDEPENDENT_AMBULATORY_CARE_PROVIDER_SITE_OTHER): Payer: Self-pay | Admitting: Orthopaedic Surgery

## 2017-06-23 ENCOUNTER — Telehealth (INDEPENDENT_AMBULATORY_CARE_PROVIDER_SITE_OTHER): Payer: Self-pay | Admitting: Orthopaedic Surgery

## 2017-06-23 NOTE — Telephone Encounter (Signed)
This patient had to r/s her appt on 4/3 due to Dr. [name] being out of the office. His next available for her appt slot was 4/23. She wasn't sure if any time could be opened up for her, she said the problems she is having with her neck and back is very painful and the only thing she can do is lay on a heating pad to get any relief. Please advise when possible # [phone]

## 2017-06-27 ENCOUNTER — Telehealth: Payer: Self-pay | Admitting: Internal Medicine

## 2017-06-27 NOTE — Telephone Encounter (Signed)
Pt called to request a refill for acetaminophen-codeine (TYLENOL #4) 300-60 MG tablet Please sent it  [facility] [address], please follow up  Date it for 07/01/17

## 2017-06-28 MED FILL — ATORVASTATIN 20 MG TABLET: 20 | 30 days supply | Qty: 30 | Fill #2

## 2017-06-28 MED FILL — AMLODIPINE BESYLATE 10 MG T: 10 | 30 days supply | Qty: 30 | Fill #5

## 2017-06-29 ENCOUNTER — Ambulatory Visit (INDEPENDENT_AMBULATORY_CARE_PROVIDER_SITE_OTHER): Payer: Self-pay | Admitting: Orthopaedic Surgery

## 2017-06-29 NOTE — Telephone Encounter (Signed)
Patient called and stated she has missed a phone call. Patient was informed and verbalized understanding regarding her tylenol 4.

## 2017-06-29 NOTE — Telephone Encounter (Signed)
Looked in the medication book and seen that pt came and got her rx in March when it was filled. Contacted pt and left a detailed vm informing pt that she picked up rx in March and she has refills on it and she will need to contact the pharmacy that she took the rx and call them to get a refill

## 2017-07-18 NOTE — Telephone Encounter (Signed)
No earlier appts.

## 2017-07-19 ENCOUNTER — Ambulatory Visit (INDEPENDENT_AMBULATORY_CARE_PROVIDER_SITE_OTHER): Payer: Self-pay

## 2017-07-19 ENCOUNTER — Ambulatory Visit (INDEPENDENT_AMBULATORY_CARE_PROVIDER_SITE_OTHER): Payer: Self-pay | Admitting: Orthopaedic Surgery

## 2017-07-19 ENCOUNTER — Encounter (INDEPENDENT_AMBULATORY_CARE_PROVIDER_SITE_OTHER): Payer: Self-pay | Admitting: Orthopaedic Surgery

## 2017-07-19 VITALS — BP 157/94 | HR 62 | Temp 97.9°F | Ht 63.0 in | Wt 193.0 lb

## 2017-07-19 DIAGNOSIS — M542 Cervicalgia: Secondary | ICD-10-CM

## 2017-07-19 DIAGNOSIS — M545 Low back pain: Secondary | ICD-10-CM

## 2017-07-19 DIAGNOSIS — M4802 Spinal stenosis, cervical region: Secondary | ICD-10-CM

## 2017-07-19 NOTE — Addendum Note (Signed)
Addended by: Elta Guadeloupe on: 07/19/2017 10:02 AM   Modules accepted: Orders

## 2017-07-19 NOTE — Progress Notes (Signed)
Office Visit Note/orthopedic consultation   Patient: Charlene Allen           Date of Birth: May 01, 1955           MRN: [id number] Visit Date: 07/19/2017              Requested by: Ladell Pier, MD [id number] [place], [place] [address] PCP: Ladell Pier, MD   Assessment & Plan: Visit Diagnoses:  1. Low back pain, unspecified back pain laterality, unspecified chronicity, with sciatica presence unspecified   2. Neck pain   3. Spinal stenosis of cervical region     Plan: We reviewed the imaging studies from 2017 lumbar spine as well as cervical spine.  We will order a new cervical MRI scan based on changes on her plain radiographs.  Office follow-up after cervical MRI scan.  Thank you for the opportunity to see her in consultation.  Previous MRI scan cervical spine 2017 showed some cord flattening from C3-C6 primarily discogenic.  She had foraminal narrowing worse on the right than left particularly severe at C3-4 consistent with her current symptoms.  Will defer lumbar plans at this time.  She is already had 2 MRI scans 2015 as well as 2017 which showed facet arthropathy with some anterolisthesis grade 1 at L4-5.  Follow-Up Instructions: No follow-ups on file.   Orders:  Orders Placed This Encounter  Procedures  . XR Lumbar Spine 2-3 Views  . XR Cervical Spine 2 or 3 views   No orders of the defined types were placed in this encounter.     Procedures: No procedures performed   Clinical Data: No additional findings.   Subjective: Chief Complaint  Patient presents with  . Lower Back - Pain  . Neck - Pain    HPI 62 year old female seen with chronic low back pain and also neck pain which is intermittent but equally severe when it bothers her.  She has not worked since around 2015 used to work Investment banker, corporate job.  She is applied for disability has been turned down has an attorney currently.  She has been on anti-inflammatories, Lyrica, Tylenol No. 4.  She states  the pain radiates from her neck and her right shoulder down to the deltoid region.  She can get her arm up overhead without problems.  No numbness or tingling in her hands.  She states her low back pain is constant.  Review of Systems for chronic back pain, hypertension, chronic cigarette use 1/2 pack/day.  History of diastolic dysfunction, COPD Gold, chronic neck pain, history of substance abuse THC, history of diarrhea.  Morbid obesity.  Right shoulder pain.  Endometrial stripe thickness.  Otherwise negative as it pertains HPI 14 point update.   Objective: Vital Signs: BP (!) 157/94   Pulse 62   Temp 97.9 F (36.6 C)   Ht 5\' 3"  (1.6 m)   Wt 193 lb (87.5 kg)   BMI 34.19 kg/m   Physical Exam  Constitutional: She is oriented to person, place, and time. She appears well-developed.  HENT:  Head: Normocephalic.  Right Ear: External ear normal.  Left Ear: External ear normal.  Eyes: Pupils are equal, round, and reactive to light.  Neck: No tracheal deviation present. No thyromegaly present.  Cardiovascular: Normal rate.  Pulmonary/Chest: Effort normal.  Abdominal: Soft.  Neurological: She is alert and oriented to person, place, and time.  Skin: Skin is warm and dry.  Psychiatric: She has a normal mood and affect. Her behavior  is normal.    Ortho Exam patient has good upper extremity muscles without atrophy.  She complains of some decreased sensation laterally over the deltoid.  Negative drop arm test.  Upper extremity reflexes biceps triceps brachioradialis are 2+ and symmetrical.  2+ knee and ankle jerk.  Normal heel toe gait.  Forward flexion chin to finger breast chin to chest.  Negative for me.  Positive Spurling on the right negative on the left.  Severe brachial plexus tenderness on the right.  Mild to moderate on the left.  No interosseous atrophy per fundi several my wrist flexion extension is strong.  No quad atrophy or calf atrophy.   Specialty Comments:  No specialty comments  available.  Imaging:CLINICAL DATA:  Chronic and worsening back pain.  EXAM: MRI LUMBAR SPINE WITHOUT CONTRAST  TECHNIQUE: Multiplanar, multisequence MR imaging of the lumbar spine was performed. No intravenous contrast was administered.  COMPARISON:  03/27/2014  FINDINGS: Segmentation:  Standard.  Alignment:  L4-5 grade 1 anterolisthesis, chronic  Vertebrae:  No fracture, evidence of discitis, or bone lesion.  Conus medullaris: Extends to the mid L2 level and appears normal.  Paraspinal and other soft tissues: The upper endometrium shows 9 mm thickness, unexpected in this post menopausal age patient. There is history of postmenopausal bleeding based on 10/11/2013 pelvic ultrasound. Endometrial stripe thickness is stable from prior.  Disc levels:  T12- L1: Spondylosis and disc narrowing.  No impingement  L1-L2: Mild facet spurring.  No herniation or impingement  L2-L3: Disc narrowing and annulus bulging.  No impingement  L3-L4: Facet arthropathy with advanced hypertrophy, especially on the right where the joint is more horizontal and distorted. No herniation. No impingement  L4-L5: Advanced facet arthropathy with chronic anterolisthesis. Mild noncompressive bilateral foraminal narrowing. Patent canal  L5-S1:Mild facet spurring.  No impingement.  IMPRESSION: 1. Stable compared to 2015.  No high-grade/compressive stenosis. 2. Advanced facet arthropathy at L3-4 and L4-5 with chronic grade 1 anterolisthesis at L4-5. 3. 9 mm endometrial stripe, unexpected for age but stable from 13. History of postmenopausal bleeding workup per ultrasound 10/11/2013, please ensure patient has appropriate gynecologic follow-up.   Electronically Signed   By: Monte Fantasia M.D.   On: 02/02/2016 11:23    PMFS History: Patient Active Problem List   Diagnosis Date Noted  . Increased endometrial stripe thickness 09/08/2016  . Chronic neck pain 08/16/2016  .  Chronic right shoulder pain 07/22/2016  . Morbid obesity due to excess calories ([place]) 06/20/2016  . COPD GOLD 0 still smoking  06/18/2016  . HPV test positive 10/13/2015  . Precordial chest pain 09/09/2015  . Mild diastolic dysfunction 86/75/4492  . Chronic diarrhea 08/13/2015  . Cigarette smoker 08/13/2015  . Chronic back pain 03/15/2013  . Essential hypertension, benign 03/15/2013  . History of substance abuse 03/15/2013   Past Medical History:  Diagnosis Date  . Hypertension Dx 2015  . Obesity   . PMB (postmenopausal bleeding)     Family History  Problem Relation Age of Onset  . COPD Mother   . Stroke Mother   . Heart disease Mother   . Hypertension Mother   . Cancer Sister 25       cervical cancer   . Stroke Maternal Grandmother     Past Surgical History:  Procedure Laterality Date  . ABDOMINAL AORTOGRAM N/A 07/12/2016   Procedure: Abdominal Aortogram;  Surgeon: Jettie Booze, MD;  Location: [facility];  Service: Cardiovascular;  Laterality: N/A;  . HAND SURGERY    .  LEFT HEART CATH AND CORONARY ANGIOGRAPHY N/A 07/12/2016   Procedure: Left Heart Cath and Coronary Angiography;  Surgeon: Jettie Booze, MD;  Location: [facility];  Service: Cardiovascular;  Laterality: N/A;  . TUBAL LIGATION     Social History   Occupational History  . Not on file  Tobacco Use  . Smoking status: Current Every Day Smoker    Packs/day: 1.00    Years: 40.00    Pack years: 40.00  . Smokeless tobacco: Never Used  Substance and Sexual Activity  . Alcohol use: No    Alcohol/week: 0.0 oz  . Drug use: No  . Sexual activity: Not Currently    Birth control/protection: None

## 2017-07-19 NOTE — Addendum Note (Signed)
Addended by: Elta Guadeloupe on: 07/19/2017 09:45 AM   Modules accepted: Orders

## 2017-07-21 ENCOUNTER — Encounter: Payer: Self-pay | Admitting: Internal Medicine

## 2017-07-21 ENCOUNTER — Other Ambulatory Visit: Payer: Self-pay

## 2017-07-21 ENCOUNTER — Ambulatory Visit: Payer: Self-pay | Attending: Internal Medicine | Admitting: Internal Medicine

## 2017-07-21 VITALS — BP 143/77 | HR 69 | Temp 98.1°F | Ht 63.0 in | Wt 183.8 lb

## 2017-07-21 DIAGNOSIS — Z6832 Body mass index (BMI) 32.0-32.9, adult: Secondary | ICD-10-CM | POA: Insufficient documentation

## 2017-07-21 DIAGNOSIS — F17210 Nicotine dependence, cigarettes, uncomplicated: Secondary | ICD-10-CM | POA: Insufficient documentation

## 2017-07-21 DIAGNOSIS — Z8249 Family history of ischemic heart disease and other diseases of the circulatory system: Secondary | ICD-10-CM | POA: Insufficient documentation

## 2017-07-21 DIAGNOSIS — Z8049 Family history of malignant neoplasm of other genital organs: Secondary | ICD-10-CM | POA: Insufficient documentation

## 2017-07-21 DIAGNOSIS — Z888 Allergy status to other drugs, medicaments and biological substances status: Secondary | ICD-10-CM | POA: Insufficient documentation

## 2017-07-21 DIAGNOSIS — R0789 Other chest pain: Secondary | ICD-10-CM | POA: Insufficient documentation

## 2017-07-21 DIAGNOSIS — F17200 Nicotine dependence, unspecified, uncomplicated: Secondary | ICD-10-CM

## 2017-07-21 DIAGNOSIS — Z823 Family history of stroke: Secondary | ICD-10-CM | POA: Insufficient documentation

## 2017-07-21 DIAGNOSIS — N393 Stress incontinence (female) (male): Secondary | ICD-10-CM | POA: Insufficient documentation

## 2017-07-21 DIAGNOSIS — F329 Major depressive disorder, single episode, unspecified: Secondary | ICD-10-CM | POA: Insufficient documentation

## 2017-07-21 DIAGNOSIS — Z7951 Long term (current) use of inhaled steroids: Secondary | ICD-10-CM | POA: Insufficient documentation

## 2017-07-21 DIAGNOSIS — Z7982 Long term (current) use of aspirin: Secondary | ICD-10-CM | POA: Insufficient documentation

## 2017-07-21 DIAGNOSIS — I10 Essential (primary) hypertension: Secondary | ICD-10-CM | POA: Insufficient documentation

## 2017-07-21 DIAGNOSIS — Z79899 Other long term (current) drug therapy: Secondary | ICD-10-CM | POA: Insufficient documentation

## 2017-07-21 DIAGNOSIS — Z825 Family history of asthma and other chronic lower respiratory diseases: Secondary | ICD-10-CM | POA: Insufficient documentation

## 2017-07-21 DIAGNOSIS — J449 Chronic obstructive pulmonary disease, unspecified: Secondary | ICD-10-CM | POA: Insufficient documentation

## 2017-07-21 DIAGNOSIS — E669 Obesity, unspecified: Secondary | ICD-10-CM | POA: Insufficient documentation

## 2017-07-21 DIAGNOSIS — Z1211 Encounter for screening for malignant neoplasm of colon: Secondary | ICD-10-CM

## 2017-07-21 DIAGNOSIS — M545 Low back pain: Secondary | ICD-10-CM | POA: Insufficient documentation

## 2017-07-21 DIAGNOSIS — M5412 Radiculopathy, cervical region: Secondary | ICD-10-CM | POA: Insufficient documentation

## 2017-07-21 MED ORDER — HYDROCHLOROTHIAZIDE 25 MG PO TABS: 25.0000 mg | ORAL_TABLET | Freq: Every day | ORAL | 6 refills | Status: DC

## 2017-07-21 MED ORDER — PREGABALIN 75 MG PO CAPS: 75.0000 mg | ORAL_CAPSULE | Freq: Three times a day (TID) | ORAL | 2 refills | Status: DC

## 2017-07-21 NOTE — Progress Notes (Signed)
Patient ID: Charlene Allen, female    DOB: 1955/10/17  MRN: [id number]  CC: Back Pain and Knee Pain   Subjective: Charlene Allen is a 62 y.o. female who presents for  Chronic ds management Her concerns today include:  Hx of chronic neck/lower back pain on Tylenol #4, Lyrica), HTN, HL, tob, obesity, COPD, depression  1.  C/o pain in LT side of chest, into LT arm and wrist.   -hurts to take a deep breath -"Like a real sharp pain and a dull aching in my arm."  Rates pain 8/10 Intermittent since Sunday night.  LiftED her grand-daughter that day.   GD is 18 mth old. -she has cut back on smoking, down to 4 cig/day.  Using patches -no fhx of heart ds.  Compliant with BP meds and took Amlopidine and HCTZ already for the morning.  Compliant with Lipitor  2.  Chronic neck/LBP:  Saw Dr. [name] this wk. Plan to repeat MRI neck before considering surgical options -did get Lyrica; can not tell a difference with this.  She stopped Cymbalta 1 mth ago. "it put me in a daze." -still taking the Tylenol #4. Finds it helpful.  Denies any constipation or drowsiness with the medication  3.  COPD:  Bad allergy symptoms in past 2 wks.  Symptoms - sneezing, congestion, lacromation.  Using Allegra OTC.  No occasional dizziness lasts a few seconds  4.  Complains of leakage of urine with coughing, sneezing or laughing.  No dysuria.   Patient Active Problem List   Diagnosis Date Noted  . Increased endometrial stripe thickness 09/08/2016  . Chronic neck pain 08/16/2016  . Chronic right shoulder pain 07/22/2016  . Morbid obesity due to excess calories ([place]) 06/20/2016  . COPD GOLD 0 still smoking  06/18/2016  . HPV test positive 10/13/2015  . Precordial chest pain 09/09/2015  . Mild diastolic dysfunction 40/97/3532  . Chronic diarrhea 08/13/2015  . Cigarette smoker 08/13/2015  . Chronic back pain 03/15/2013  . Essential hypertension, benign 03/15/2013  . History of substance abuse 03/15/2013      Current Outpatient Medications on File Prior to Visit  Medication Sig Dispense Refill  . acetaminophen-codeine (TYLENOL #4) 300-60 MG tablet Take 1 tablet by mouth every 6 (six) hours as needed for moderate pain. Fill on or after 06/03/2017 120 tablet 2  . albuterol (PROVENTIL HFA;VENTOLIN HFA) 108 (90 Base) MCG/ACT inhaler Inhale 2 puffs into the lungs every 6 (six) hours as needed for wheezing or shortness of breath. 1 Inhaler 3  . amLODipine (NORVASC) 10 MG tablet Take 1 tablet (10 mg total) by mouth daily. 90 tablet 2  . aspirin 81 MG tablet Take 1 tablet (81 mg total) by mouth daily. 90 tablet 3  . atorvastatin (LIPITOR) 20 MG tablet Take 1 tablet (20 mg total) by mouth daily. 90 tablet 1  . budesonide-formoterol (SYMBICORT) 80-4.5 MCG/ACT inhaler Take 2 puffs first thing in am and then another 2 puffs about 12 hours later. 1 Inhaler 11  . ibuprofen (ADVIL,MOTRIN) 200 MG tablet Take 400 mg by mouth daily as needed for moderate pain.    Marland Kitchen losartan (COZAAR) 100 MG tablet Take 1 tablet (100 mg total) by mouth daily. 90 tablet 3  . methocarbamol (ROBAXIN) 500 MG tablet Take 1 tablet (500 mg total) by mouth every 8 (eight) hours as needed for muscle spasms. 60 tablet 1  . Multiple Vitamin (MULTIVITAMIN WITH MINERALS) TABS tablet Take 1 tablet by mouth daily.    Marland Kitchen  nicotine (NICODERM CQ - DOSED IN MG/24 HOURS) 14 mg/24hr patch Place 1 patch (14 mg total) onto the skin daily. 28 patch 0  . nitroGLYCERIN (NITROSTAT) 0.4 MG SL tablet Place 1 tablet (0.4 mg total) under the tongue every 5 (five) minutes as needed for chest pain. If you need more than 2 doses call 911 20 tablet 1   No current facility-administered medications on file prior to visit.     Allergies  Allergen Reactions  . Cymbalta [Duloxetine Hcl]     PUT ME IN A DAZE    Social History   Socioeconomic History  . Marital status: Married    Spouse name: Not on file  . Number of children: Not on file  . Years of education: Not on  file  . Highest education level: Not on file  Occupational History  . Not on file  Social Needs  . Financial resource strain: Not on file  . Food insecurity:    Worry: Not on file    Inability: Not on file  . Transportation needs:    Medical: Not on file    Non-medical: Not on file  Tobacco Use  . Smoking status: Current Every Day Smoker    Packs/day: 1.00    Years: 40.00    Pack years: 40.00  . Smokeless tobacco: Never Used  Substance and Sexual Activity  . Alcohol use: No    Alcohol/week: 0.0 oz  . Drug use: No  . Sexual activity: Not Currently    Birth control/protection: None  Lifestyle  . Physical activity:    Days per week: Not on file    Minutes per session: Not on file  . Stress: Not on file  Relationships  . Social connections:    Talks on phone: Not on file    Gets together: Not on file    Attends religious service: Not on file    Active member of club or organization: Not on file    Attends meetings of clubs or organizations: Not on file    Relationship status: Not on file  . Intimate partner violence:    Fear of current or ex partner: Not on file    Emotionally abused: Not on file    Physically abused: Not on file    Forced sexual activity: Not on file  Other Topics Concern  . Not on file  Social History Narrative  . Not on file    Family History  Problem Relation Age of Onset  . COPD Mother   . Stroke Mother   . Heart disease Mother   . Hypertension Mother   . Cancer Sister 25       cervical cancer   . Stroke Maternal Grandmother     Past Surgical History:  Procedure Laterality Date  . ABDOMINAL AORTOGRAM N/A 07/12/2016   Procedure: Abdominal Aortogram;  Surgeon: Jettie Booze, MD;  Location: [facility];  Service: Cardiovascular;  Laterality: N/A;  . HAND SURGERY    . LEFT HEART CATH AND CORONARY ANGIOGRAPHY N/A 07/12/2016   Procedure: Left Heart Cath and Coronary Angiography;  Surgeon: Jettie Booze, MD;  Location: [facility];  Service: Cardiovascular;  Laterality: N/A;  . TUBAL LIGATION      ROS: Review of Systems Negative except as stated above PHYSICAL EXAM: BP (!) 143/77   Pulse 69   Temp 98.1 F (36.7 C) (Oral)   Ht 5\' 3"  (1.6 m)   Wt 183 lb 12.8  oz (83.4 kg)   SpO2 98%   BMI 32.56 kg/m   Wt Readings from Last 3 Encounters:  07/21/17 183 lb 12.8 oz (83.4 kg)  07/19/17 193 lb (87.5 kg)  04/26/17 191 lb 9.6 oz (86.9 kg)   Repeat blood pressure BP 150/78 Physical Exam  General appearance - alert, well appearing, and in no distress Mental status - alert, oriented to person, place, and time, normal mood, behavior, speech, dress, motor activity, and thought processes Mouth - mucous membranes moist, pharynx normal without lesions Neck - supple, no significant adenopathy Chest - clear to auscultation, no wheezes, rales or rhonchi, symmetric air entry Heart - normal rate, regular rhythm, normal S1, S2, no murmurs, rubs, clicks or gallops.  Mild tenderness on palpation of the left upper anterior chest wall Extremities - peripheral pulses normal, no pedal edema, no clubbing or cyanosis  EKG: NSR and NL EKG  unchanged  ASSESSMENT AND PLAN: 1. Other chest pain -likely MSK in nature and may be related to cervical radiculopathy but she does have RFs for CAD.  Other possibility is PE given there is a pleuritic component. -Patient sent to the ER for further evaluation.  2. Cervical radiculopathy Increase Lyrica to 75 mg 3 times daily.  Stop Cymbalta since she has not found that helpful and causes mental fogginess -Continue Tylenol No. 4.  This appears to give some relief to allow her to function.  Currently being evaluated by Dr. [name] and may be a surgical candidate. Controlled substance agreement updated today.  Last urine drug screen 11/2016. [place] reviewed.  - pregabalin (LYRICA) 75 MG capsule; Take 1 capsule (75 mg total) by mouth 3 (three) times daily. For PASS  Dispense: 270 capsule;  Refill: 2  3. Essential hypertension -Not at goal.  Add HCTZ.  Continue low-salt diet - hydrochlorothiazide (HYDRODIURIL) 25 MG tablet; Take 1 tablet (25 mg total) by mouth daily.  Dispense: 30 tablet; Refill: 6  4. Tobacco use disorder Commended her on cutting back.  Continue nicotine patches 5. Stress incontinence Discussed diagnosis.  Recommend Kegel's exercises  6. Colon cancer screening - Fecal occult blood, imunochemical([facility])  Patient was given the opportunity to ask questions.  Patient verbalized understanding of the plan and was able to repeat key elements of the plan.   Orders Placed This Encounter  Procedures  . Fecal occult blood, imunochemical([facility])     Requested Prescriptions   Signed Prescriptions Disp Refills  . pregabalin (LYRICA) 75 MG capsule 270 capsule 2    Sig: Take 1 capsule (75 mg total) by mouth 3 (three) times daily. For PASS  . hydrochlorothiazide (HYDRODIURIL) 25 MG tablet 30 tablet 6    Sig: Take 1 tablet (25 mg total) by mouth daily.    Return in about 3 months (around 10/20/2017).  Karle Plumber, MD, FACP

## 2017-07-21 NOTE — Progress Notes (Signed)
Patient is having neck pain   

## 2017-07-21 NOTE — Patient Instructions (Addendum)
Increase Hydrochlorothiazide to 25 mg daily for better blood pressure control. Increase Lyrica to 75 mg three times a day. Stop Cymbalta. Please be evaluated in the ER for chest pain.   Urinary Incontinence Urinary incontinence is the involuntary loss of urine from your bladder. What are the causes? There are many causes of urinary incontinence. They include:  Medicines.  Infections.  Prostatic enlargement, leading to overflow of urine from your bladder.  Surgery.  Neurological diseases.  Emotional factors.  What are the signs or symptoms? Urinary Incontinence can be divided into four types: 1. Urge incontinence. Urge incontinence is the involuntary loss of urine before you have the opportunity to go to the bathroom. There is a sudden urge to void but not enough time to reach a bathroom. 2. Stress incontinence. Stress incontinence is the sudden loss of urine with any activity that forces urine to pass. It is commonly caused by anatomical changes to the pelvis and sphincter areas of your body. 3. Overflow incontinence. Overflow incontinence is the loss of urine from an obstructed opening to your bladder. This results in a backup of urine and a resultant buildup of pressure within the bladder. When the pressure within the bladder exceeds the closing pressure of the sphincter, the urine overflows, which causes incontinence, similar to water overflowing a dam. 4. Total incontinence. Total incontinence is the loss of urine as a result of the inability to store urine within your bladder.  How is this diagnosed? Evaluating the cause of incontinence may require:  A thorough and complete medical and obstetric history.  A complete physical exam.  Laboratory tests such as a urine culture and sensitivities.  When additional tests are indicated, they can include:  An ultrasound exam.  Kidney and bladder X-rays.  Cystoscopy. This is an exam of the bladder using a narrow  scope.  Urodynamic testing to test the nerve function to the bladder and sphincter areas.  How is this treated? Treatment for urinary incontinence depends on the cause:  For urge incontinence caused by a bacterial infection, antibiotics will be prescribed. If the urge incontinence is related to medicines you take, your health care provider may have you change the medicine.  For stress incontinence, surgery to re-establish anatomical support to the bladder or sphincter, or both, will often correct the condition.  For overflow incontinence caused by an enlarged prostate, an operation to open the channel through the enlarged prostate will allow the flow of urine out of the bladder. In women with fibroids, a hysterectomy may be recommended.  For total incontinence, surgery on your urinary sphincter may help. An artificial urinary sphincter (an inflatable cuff placed around the urethra) may be required. In women who have developed a hole-like passage between their bladder and vagina (vesicovaginal fistula), surgery to close the fistula often is required.  Follow these instructions at home:  Normal daily hygiene and the use of pads or adult diapers that are changed regularly will help prevent odors and skin damage.  Avoid caffeine. It can overstimulate your bladder.  Use the bathroom regularly. Try about every 2-3 hours to go to the bathroom, even if you do not feel the need to do so. Take time to empty your bladder completely. After urinating, wait a minute. Then try to urinate again.  For causes involving nerve dysfunction, keep a log of the medicines you take and a journal of the times you go to the bathroom. Contact a health care provider if:  You experience worsening of pain  instead of improvement in pain after your procedure.  Your incontinence becomes worse instead of better. Get help right away if:  You experience fever or shaking chills.  You are unable to pass your urine.  You  have redness spreading into your groin or down into your thighs. This information is not intended to replace advice given to you by your health care provider. Make sure you discuss any questions you have with your health care provider. Document Released: 04/22/2004 Document Revised: 10/24/2015 Document Reviewed: 08/22/2012 Elsevier Interactive Patient Education  [employer].

## 2017-07-22 ENCOUNTER — Encounter (HOSPITAL_COMMUNITY): Payer: Self-pay

## 2017-07-22 ENCOUNTER — Telehealth: Payer: Self-pay | Admitting: Internal Medicine

## 2017-07-22 ENCOUNTER — Other Ambulatory Visit: Payer: Self-pay

## 2017-07-22 ENCOUNTER — Emergency Department (HOSPITAL_COMMUNITY): Payer: Self-pay

## 2017-07-22 ENCOUNTER — Emergency Department (HOSPITAL_COMMUNITY)
Admission: EM | Admit: 2017-07-22 | Discharge: 2017-07-22 | Disposition: A | Discharge status: Home / Self Care | Payer: Self-pay | Attending: Emergency Medicine | Admitting: Emergency Medicine

## 2017-07-22 DIAGNOSIS — J449 Chronic obstructive pulmonary disease, unspecified: Secondary | ICD-10-CM | POA: Insufficient documentation

## 2017-07-22 DIAGNOSIS — Z7982 Long term (current) use of aspirin: Secondary | ICD-10-CM | POA: Insufficient documentation

## 2017-07-22 DIAGNOSIS — I10 Essential (primary) hypertension: Secondary | ICD-10-CM | POA: Insufficient documentation

## 2017-07-22 DIAGNOSIS — Z79899 Other long term (current) drug therapy: Secondary | ICD-10-CM | POA: Insufficient documentation

## 2017-07-22 DIAGNOSIS — M79622 Pain in left upper arm: Secondary | ICD-10-CM | POA: Insufficient documentation

## 2017-07-22 DIAGNOSIS — Y999 Unspecified external cause status: Secondary | ICD-10-CM | POA: Insufficient documentation

## 2017-07-22 DIAGNOSIS — Y9389 Activity, other specified: Secondary | ICD-10-CM | POA: Insufficient documentation

## 2017-07-22 DIAGNOSIS — F17200 Nicotine dependence, unspecified, uncomplicated: Secondary | ICD-10-CM | POA: Insufficient documentation

## 2017-07-22 DIAGNOSIS — R0789 Other chest pain: Secondary | ICD-10-CM | POA: Insufficient documentation

## 2017-07-22 DIAGNOSIS — X500XXA Overexertion from strenuous movement or load, initial encounter: Secondary | ICD-10-CM | POA: Insufficient documentation

## 2017-07-22 DIAGNOSIS — Y929 Unspecified place or not applicable: Secondary | ICD-10-CM | POA: Insufficient documentation

## 2017-07-22 LAB — CBC
HCT: 42.6 % (ref 36.0–46.0)
Hemoglobin: 14.5 g/dL (ref 12.0–15.0)
MCH: 28.9 pg (ref 26.0–34.0)
MCHC: 34 g/dL (ref 30.0–36.0)
MCV: 85 fL (ref 78.0–100.0)
PLATELETS: 349 10*3/uL (ref 150–400)
RBC: 5.01 MIL/uL (ref 3.87–5.11)
RDW: 12.7 % (ref 11.5–15.5)
WBC: 10.7 10*3/uL — AB (ref 4.0–10.5)

## 2017-07-22 LAB — BASIC METABOLIC PANEL
Anion gap: 11 (ref 5–15)
BUN: 11 mg/dL (ref 6–20)
CALCIUM: 9.5 mg/dL (ref 8.9–10.3)
CO2: 22 mmol/L (ref 22–32)
CREATININE: 0.78 mg/dL (ref 0.44–1.00)
Chloride: 103 mmol/L (ref 101–111)
GFR calc non Af Amer: >60 mL/min (ref >60)
Glucose, Bld: 147 mg/dL — ABNORMAL HIGH (ref 65–99)
Potassium: 3.6 mmol/L (ref 3.5–5.1)
SODIUM: 136 mmol/L (ref 135–145)

## 2017-07-22 LAB — I-STAT TROPONIN, ED
TROPONIN I, POC: 0 ng/mL (ref 0.00–0.08)
TROPONIN I, POC: 0 ng/mL (ref 0.00–0.08)

## 2017-07-22 MED ORDER — CYCLOBENZAPRINE HCL 10 MG PO TABS
10.0000 mg | ORAL_TABLET | Freq: Once | ORAL | Status: AC
  Administered 2017-07-22: 10 mg via ORAL
  Filled 2017-07-22: qty 1

## 2017-07-22 MED ORDER — LIDOCAINE 5 % EX PTCH
1.0000 | MEDICATED_PATCH | CUTANEOUS | Status: DC
  Administered 2017-07-22: 1 via TRANSDERMAL
  Filled 2017-07-22: qty 1

## 2017-07-22 MED ORDER — KETOROLAC TROMETHAMINE 60 MG/2ML IM SOLN
30.0000 mg | Freq: Once | INTRAMUSCULAR | Status: AC
  Administered 2017-07-22: 30 mg via INTRAMUSCULAR
  Filled 2017-07-22: qty 2

## 2017-07-22 NOTE — ED Triage Notes (Signed)
Patient complains of 1 week of CP that is worse with movement and inspiration. No cough, no cold. Denies any associated symptoms. Alert and oriented

## 2017-07-22 NOTE — ED Notes (Signed)
Pt left prior to receiving d/c paperwork or allowing this RN to obtain new set of vitals or complete pain reassessment

## 2017-07-22 NOTE — Telephone Encounter (Signed)
Patient called stating that her PCP told her yesterday to go to the ED. Patient stated that she has been at the ED since 8:30 a.m today, and has not been seen by a provider. Patient states that an x-ray and labs have been taking but is still waiting in the waiting room. Please f/u

## 2017-07-22 NOTE — Telephone Encounter (Signed)
Will forward tp pcp

## 2017-07-22 NOTE — ED Provider Notes (Signed)
[facility] Provider Note   CSN: [id number] Arrival date & time: 07/22/17  0859   History   Chief Complaint Chief Complaint  Patient presents with  . Chest Pain    HPI Charlene Allen is a 62 y.o. female.   Patient with left-sided chest wall pain and left bicep pain for the last several days.  Patient believes she pulled a muscle in her chest wall after carrying her grandchild this past weekend.  Has a history of arthritis.  Denies any chest pain, shortness of breath, abdominal pain.  Had a recent heart catheterization last year that was unremarkable she says.  Denies any PE or DVT risk factors.   The history is provided by the patient.   Illness   This is a new problem. The current episode started more than 2 days ago. The problem occurs daily. The problem has not changed since onset.Associated symptoms include chest pain (chest wall pain). Pertinent negatives include no abdominal pain and no shortness of breath.  Nothing (movement) aggravates the symptoms.  Nothing relieves the symptoms. She has tried nothing for the symptoms. The treatment provided no relief.    Past Medical History:  Diagnosis Date  . Hypertension Dx 2015  . Obesity   . PMB (postmenopausal bleeding)     Patient Active Problem List   Diagnosis Date Noted  . Increased endometrial stripe thickness 09/08/2016  . Chronic neck pain 08/16/2016  . Chronic right shoulder pain 07/22/2016  . Morbid obesity due to excess calories ([place]) 06/20/2016  . COPD GOLD 0 still smoking  06/18/2016  . HPV test positive 10/13/2015  . Precordial chest pain 09/09/2015  . Mild diastolic dysfunction 76/19/5093  . Chronic diarrhea 08/13/2015  . Cigarette smoker 08/13/2015  . Chronic back pain 03/15/2013  . Essential hypertension, benign 03/15/2013  . History of substance abuse 03/15/2013    Past Surgical History:  Procedure Laterality Date  . ABDOMINAL AORTOGRAM N/A 07/12/2016   Procedure: Abdominal Aortogram;  Surgeon: Charlene Booze, MD;  Location: [facility];  Service: Cardiovascular;  Laterality: N/A;  . HAND SURGERY    . LEFT HEART CATH AND CORONARY ANGIOGRAPHY N/A 07/12/2016   Procedure: Left Heart Cath and Coronary Angiography;  Surgeon: Charlene Booze, MD;  Location: [facility];  Service: Cardiovascular;  Laterality: N/A;  . TUBAL LIGATION       OB History    Gravida  4   Para  3   Term  3   Preterm      AB  1   Living  3     SAB      TAB  1   Ectopic      Multiple      Live Births               Home Medications    Prior to Admission medications   Medication Sig Start Date End Date Taking? Authorizing Provider  albuterol (PROVENTIL HFA;VENTOLIN HFA) 108 (90 Base) MCG/ACT inhaler Inhale 2 puffs into the lungs every 6 (six) hours as needed for wheezing or shortness of breath. 04/26/17  Yes Charlene Pier, MD  amLODipine (NORVASC) 10 MG tablet Take 1 tablet (10 mg total) by mouth daily. 12/14/16  Yes Charlene Pier, MD  aspirin 81 MG tablet Take 1 tablet (81 mg total) by mouth daily. 09/09/15  Yes Allen, Josalyn, MD  atorvastatin (LIPITOR) 20 MG tablet Take 1 tablet (20 mg total) by  mouth daily. 12/14/16  Yes Charlene Pier, MD  hydrochlorothiazide (HYDRODIURIL) 25 MG tablet Take 1 tablet (25 mg total) by mouth daily. 07/21/17  Yes Charlene Pier, MD  ibuprofen (ADVIL,MOTRIN) 200 MG tablet Take 400 mg by mouth daily as needed for moderate pain.   Yes [provider]  losartan (COZAAR) 100 MG tablet Take 1 tablet (100 mg total) by mouth daily. 10/22/16  Yes Allen, Josalyn, MD  methocarbamol (ROBAXIN) 500 MG tablet Take 1 tablet (500 mg total) by mouth every 8 (eight) hours as needed for muscle spasms. 04/26/17  Yes Charlene Pier, MD  Multiple Vitamin (MULTIVITAMIN WITH MINERALS) TABS tablet Take 1 tablet by mouth daily.   Yes [provider]  nicotine (NICODERM CQ - DOSED IN  MG/24 HOURS) 14 mg/24hr patch Place 1 patch (14 mg total) onto the skin daily. 07/22/16  Yes Allen, Josalyn, MD  nitroGLYCERIN (NITROSTAT) 0.4 MG SL tablet Place 1 tablet (0.4 mg total) under the tongue every 5 (five) minutes as needed for chest pain. If you need more than 2 doses call 911 09/09/15  Yes Allen, Josalyn, MD  pregabalin (LYRICA) 75 MG capsule Take 1 capsule (75 mg total) by mouth 3 (three) times daily. For PASS 07/21/17  Yes Charlene Pier, MD  sodium chloride (OCEAN) 0.65 % SOLN nasal spray Place 1 spray into both nostrils as needed for congestion.   Yes [provider]  acetaminophen-codeine (TYLENOL #4) 300-60 MG tablet Take 1 tablet by mouth every 6 (six) hours as needed for moderate pain. Fill on or after 06/03/2017 Patient not taking: Reported on 07/22/2017 05/30/17   Charlene Pier, MD  budesonide-formoterol [facility]) 80-4.5 MCG/ACT inhaler Take 2 puffs first thing in am and then another 2 puffs about 12 hours later. Patient not taking: Reported on 07/22/2017 06/18/16   Charlene Rockers, MD    Family History Family History  Problem Relation Age of Onset  . COPD Mother   . Stroke Mother   . Heart disease Mother   . Hypertension Mother   . Cancer Sister 25       cervical cancer   . Stroke Maternal Grandmother     Social History Social History   Tobacco Use  . Smoking status: Current Every Day Smoker    Packs/day: 1.00    Years: 40.00    Pack years: 40.00  . Smokeless tobacco: Never Used  Substance Use Topics  . Alcohol use: No    Alcohol/week: 0.0 oz  . Drug use: No     Allergies   Cymbalta [duloxetine hcl]   Review of Systems Review of Systems  Constitutional: Negative for chills and fever.  HENT: Negative for ear pain and sore throat.   Eyes: Negative for pain and visual disturbance.  Respiratory: Negative for cough and shortness of breath.   Cardiovascular: Positive for chest pain (chest wall pain). Negative for palpitations.    Gastrointestinal: Negative for abdominal pain and vomiting.  Genitourinary: Negative for dysuria and hematuria.  Musculoskeletal: Negative for arthralgias and back pain.       Left bicep pain  Skin: Negative for color change and rash.  Neurological: Negative for seizures and syncope.  All other systems reviewed and are negative.    Physical Exam Updated Vital Signs  ED Triage Vitals  Enc Vitals Group     BP 07/22/17 0908 (!) 150/87     Pulse Rate 07/22/17 0908 71     Resp 07/22/17 0908 18  Temp 07/22/17 0908 99.4 F (37.4 C)     Temp Source 07/22/17 0908 Oral     SpO2 07/22/17 0908 99 %     Weight 07/22/17 0908 183 lb (83 kg)     Height 07/22/17 0908 5\' 3"  (1.6 m)     Head Circumference --      Peak Flow --      Pain Score 07/22/17 0914 9     Pain Loc --      Pain Edu? --      Excl. in [place]? --     Physical Exam  Constitutional: She is oriented to person, place, and time. She appears well-developed and well-nourished. No distress.  HENT:  Head: Normocephalic and atraumatic.  Eyes: Pupils are equal, round, and reactive to light. Conjunctivae and EOM are normal.  Neck: Normal range of motion. Neck supple.  Cardiovascular: Normal rate, regular rhythm, intact distal pulses and normal pulses.  No murmur heard. Pulmonary/Chest: Effort normal and breath sounds normal. No respiratory distress. She has no decreased breath sounds. She has no wheezes. She has no rhonchi. She has no rales.  Abdominal: Soft. There is no tenderness.  Musculoskeletal: She exhibits no edema.       Right lower leg: She exhibits no edema.       Left lower leg: She exhibits no edema.  TTP to left side of chest wall and left bicep  Neurological: She is alert and oriented to person, place, and time. She is not disoriented. No cranial nerve deficit.  5+/5 RUE/LUE strength and normal sensation  Skin: Skin is warm and dry. Capillary refill takes less than 2 seconds.  Psychiatric: She has a normal mood and  affect.  Nursing note and vitals reviewed.    ED Treatments / Results  Labs (all labs ordered are listed, but only abnormal results are displayed) Labs Reviewed  BASIC METABOLIC PANEL - Abnormal; Notable for the following components:      Result Value   Glucose, Bld 147 (*)    All other components within normal limits  CBC - Abnormal; Notable for the following components:   WBC 10.7 (*)    All other components within normal limits  I-STAT TROPONIN, ED  I-STAT TROPONIN, ED    EKG EKG Interpretation  Date/Time:  Friday July 22 2017 09:04:38 EDT Ventricular Rate:  70 PR Interval:  138 QRS Duration: 80 QT Interval:  396 QTC Calculation: 427 R Axis:   59 Text Interpretation:  Normal sinus rhythm Possible Anterior infarct , age undetermined Abnormal ECG When compared to prior, no signifiant changes seen.  No STEMI Confirmed by Antony Blackbird [id number]) on 07/22/2017 5:34:43 PM   Radiology Dg Chest 2 View  Result Date: 07/22/2017 CLINICAL DATA:  Chest pain.  Hypertension. EXAM: CHEST - 2 VIEW COMPARISON:  June 18, 2016 FINDINGS: There is no edema or consolidation. The heart size and pulmonary vascularity are normal. No adenopathy. There is degenerative change in the thoracic spine. IMPRESSION: No edema or consolidation. Electronically Signed   By: Lowella Grip III M.D.   On: 07/22/2017 09:36    Procedures Procedures (including critical care time)  Medications Ordered in ED Medications  lidocaine (LIDODERM) 5 % 1 patch (1 patch Transdermal Patch Applied 07/22/17 1918)  ketorolac (TORADOL) injection 30 mg (30 mg Intramuscular Given 07/22/17 1918)  cyclobenzaprine (FLEXERIL) tablet 10 mg (10 mg Oral Given 07/22/17 1918)     Initial Impression / Assessment and Plan / ED Course  I have  reviewed the triage vital signs and the nursing notes.  Pertinent labs & imaging results that were available during my care of the patient were reviewed by me and considered in my medical  decision making (see chart for details).     Laketa Sandoz is a 62 year old female with hypertension, obesity who presents to the ED with left-sided chest wall pain and left bicep pain.  Patient with unremarkable vitals.  No fever.  Patient with musculoskeletal pain for the last several days that started after she was lifting up her grandchild.  She believes she pulled a muscle due to overuse picking up her child.  Patient had a heart catheterization done last year that showed 10% disease but no other cardiac disease.  Patient has no DVT or PE risk factors.  She does have a history of COPD but does not have any wheezing or shortness of breath.  Patient EKG performed that showed normal sinus rhythm.  Patient has chest wall tenderness on exam and tender in the left biceps but normal strength and sensation.  Suspect patient with musculoskeletal chest wall pain and will treat with IM Toradol, Flexeril, lidocaine patch.  Patient had chest x-ray earlier that showed no signs of pneumonia, pneumothorax, pleural effusion.  Patient otherwise with unremarkable labs with no significant anemia, leukocytosis, acute kidney injury.  Patient already with 2 troponins that were unremarkable.  No concern for ACS at this time.  Patient eloped from the ED prior to getting lidocaine patch and Flexeril prescriptions.  Final Clinical Impressions(s) / ED Diagnoses   Final diagnoses:  Chest wall pain    ED Discharge Orders    None      Lennice Sites, DO 07/22/17 2327  Tegeler, Gwenyth Allegra, MD 07/23/17 1026

## 2017-07-25 ENCOUNTER — Ambulatory Visit (HOSPITAL_COMMUNITY)
Admission: RE | Admit: 2017-07-25 | Discharge: 2017-07-25 | Disposition: A | Discharge status: Home / Self Care | Payer: Self-pay | Source: Ambulatory Visit | Attending: Orthopaedic Surgery | Admitting: Orthopaedic Surgery

## 2017-07-25 DIAGNOSIS — M47812 Spondylosis without myelopathy or radiculopathy, cervical region: Secondary | ICD-10-CM | POA: Insufficient documentation

## 2017-07-25 DIAGNOSIS — M4802 Spinal stenosis, cervical region: Secondary | ICD-10-CM | POA: Insufficient documentation

## 2017-07-25 DIAGNOSIS — M542 Cervicalgia: Secondary | ICD-10-CM | POA: Insufficient documentation

## 2017-07-25 DIAGNOSIS — R590 Localized enlarged lymph nodes: Secondary | ICD-10-CM | POA: Insufficient documentation

## 2017-07-25 MED FILL — METHOCARBAMOL 500 MG TABLET: 500 | 20 days supply | Qty: 60 | Fill #1

## 2017-07-28 ENCOUNTER — Other Ambulatory Visit: Payer: Self-pay | Admitting: Internal Medicine

## 2017-07-28 DIAGNOSIS — G8929 Other chronic pain: Secondary | ICD-10-CM

## 2017-07-28 DIAGNOSIS — M5441 Lumbago with sciatica, right side: Principal | ICD-10-CM

## 2017-07-28 DIAGNOSIS — M5442 Lumbago with sciatica, left side: Principal | ICD-10-CM

## 2017-07-29 MED FILL — AMLODIPINE BESYLATE 10 MG T: 10 | 30 days supply | Qty: 30 | Fill #6

## 2017-07-29 MED FILL — ?ATORVASTATIN 20 MG TABLET: 20 | 30 days supply | Qty: 30 | Fill #3

## 2017-07-29 NOTE — Telephone Encounter (Signed)
Left arm is still in pain 24 hrs a day she cannot take the pain anymore wants to have you call her back she said that the pain goes from. I informed her to go to the ER OR URGENT CARE

## 2017-07-30 ENCOUNTER — Ambulatory Visit (HOSPITAL_COMMUNITY)
Admission: EM | Admit: 2017-07-30 | Discharge: 2017-07-30 | Disposition: A | Discharge status: Home / Self Care | Payer: Self-pay | Attending: Radiology | Admitting: Radiology

## 2017-07-30 ENCOUNTER — Encounter (HOSPITAL_COMMUNITY): Payer: Self-pay | Admitting: Emergency Medicine

## 2017-07-30 DIAGNOSIS — M79602 Pain in left arm: Secondary | ICD-10-CM

## 2017-07-30 MED ORDER — CLONAZEPAM 0.5 MG PO TABS: 0.5000 mg | ORAL_TABLET | Freq: Two times a day (BID) | ORAL | 0 refills | Status: DC | PRN

## 2017-07-30 MED ORDER — DICLOFENAC SODIUM 75 MG PO TBEC: 75.0000 mg | DELAYED_RELEASE_TABLET | Freq: Two times a day (BID) | ORAL | 0 refills | Status: AC

## 2017-07-30 NOTE — ED Provider Notes (Addendum)
[facility]    CSN: [id number] Arrival date & time: 07/30/17  1201     History   Chief Complaint Chief Complaint  Patient presents with  . Arm Pain    HPI [facility] is a 62 y.o. female.   62 y.o. female presents with left arm pain since Easter Sunday. Patient denies any injuries. Condition is  persistent in nature. Describes as "throbbing"  with radiation from elbow down to hand and the posterior aspect of upper arm to back. Patient reports "chest pain" that she she describes as muscular skeletal and due to her arm pain. Per Epic patient has a clean heart cath 1 year ago. Patient states that she has back problems and recently had a MRI and "will probably need back surgery"   Condition is made better by nothing. Condition is made worse by keeping the affected area stationary at night. Patient denies any relief from motrin and prescription muslce relaxant. prior to there arrival at this facility. Patient deneis any diaphoresis, vomiting, shob, history of PE or DVT. Patient was seen in ED on 4.26.19 for chest pain       Past Medical History:  Diagnosis Date  . Hypertension Dx 2015  . Obesity   . PMB (postmenopausal bleeding)     Patient Active Problem List   Diagnosis Date Noted  . Increased endometrial stripe thickness 09/08/2016  . Chronic neck pain 08/16/2016  . Chronic right shoulder pain 07/22/2016  . Morbid obesity due to excess calories ([place]) 06/20/2016  . COPD GOLD 0 still smoking  06/18/2016  . HPV test positive 10/13/2015  . Precordial chest pain 09/09/2015  . Mild diastolic dysfunction 69/67/8938  . Chronic diarrhea 08/13/2015  . Cigarette smoker 08/13/2015  . Chronic back pain 03/15/2013  . Essential hypertension, benign 03/15/2013  . History of substance abuse 03/15/2013    Past Surgical History:  Procedure Laterality Date  . ABDOMINAL AORTOGRAM N/A 07/12/2016   Procedure: Abdominal Aortogram;  Surgeon: Jettie Booze, MD;  Location:  [facility];  Service: Cardiovascular;  Laterality: N/A;  . HAND SURGERY    . LEFT HEART CATH AND CORONARY ANGIOGRAPHY N/A 07/12/2016   Procedure: Left Heart Cath and Coronary Angiography;  Surgeon: Jettie Booze, MD;  Location: [facility];  Service: Cardiovascular;  Laterality: N/A;  . TUBAL LIGATION      OB History    Gravida  4   Para  3   Term  3   Preterm      AB  1   Living  3     SAB      TAB  1   Ectopic      Multiple      Live Births               Home Medications    Prior to Admission medications   Medication Sig Start Date End Date Taking? Authorizing Provider  acetaminophen-codeine (TYLENOL #4) 300-60 MG tablet Take 1 tablet by mouth every 6 (six) hours as needed for moderate pain. Fill on or after 06/03/2017 Patient not taking: Reported on 07/22/2017 05/30/17   Ladell Pier, MD  albuterol (PROVENTIL HFA;VENTOLIN HFA) 108 [phone] Base) MCG/ACT inhaler Inhale 2 puffs into the lungs every 6 (six) hours as needed for wheezing or shortness of breath. 04/26/17   Ladell Pier, MD  amLODipine (NORVASC) 10 MG tablet Take 1 tablet (10 mg total) by mouth daily. 12/14/16   Karle Plumber  B, MD  aspirin 81 MG tablet Take 1 tablet (81 mg total) by mouth daily. 09/09/15   Funches, Adriana Mccallum, MD  atorvastatin (LIPITOR) 20 MG tablet Take 1 tablet (20 mg total) by mouth daily. 12/14/16   Ladell Pier, MD  budesonide-formoterol (SYMBICORT) 80-4.5 MCG/ACT inhaler Take 2 puffs first thing in am and then another 2 puffs about 12 hours later. Patient not taking: Reported on 07/22/2017 06/18/16   Tanda Rockers, MD  hydrochlorothiazide (HYDRODIURIL) 25 MG tablet Take 1 tablet (25 mg total) by mouth daily. 07/21/17   Ladell Pier, MD  ibuprofen (ADVIL,MOTRIN) 200 MG tablet Take 400 mg by mouth daily as needed for moderate pain.    [provider]  losartan (COZAAR) 100 MG tablet Take 1 tablet (100 mg total) by mouth daily. 10/22/16    Funches, Adriana Mccallum, MD  methocarbamol (ROBAXIN) 500 MG tablet Take 1 tablet (500 mg total) by mouth every 8 (eight) hours as needed for muscle spasms. 04/26/17   Ladell Pier, MD  Multiple Vitamin (MULTIVITAMIN WITH MINERALS) TABS tablet Take 1 tablet by mouth daily.    [provider]  nicotine (NICODERM CQ - DOSED IN MG/24 HOURS) 14 mg/24hr patch Place 1 patch (14 mg total) onto the skin daily. 07/22/16   Funches, Adriana Mccallum, MD  nitroGLYCERIN (NITROSTAT) 0.4 MG SL tablet Place 1 tablet (0.4 mg total) under the tongue every 5 (five) minutes as needed for chest pain. If you need more than 2 doses call 911 09/09/15   Boykin Nearing, MD  pregabalin (LYRICA) 75 MG capsule Take 1 capsule (75 mg total) by mouth 3 (three) times daily. For PASS 07/21/17   Ladell Pier, MD  sodium chloride (OCEAN) 0.65 % SOLN nasal spray Place 1 spray into both nostrils as needed for congestion.    [provider]    Family History Family History  Problem Relation Age of Onset  . COPD Mother   . Stroke Mother   . Heart disease Mother   . Hypertension Mother   . Cancer Sister 25       cervical cancer   . Stroke Maternal Grandmother     Social History Social History   Tobacco Use  . Smoking status: Current Every Day Smoker    Packs/day: 1.00    Years: 40.00    Pack years: 40.00  . Smokeless tobacco: Never Used  Substance Use Topics  . Alcohol use: No    Alcohol/week: 0.0 oz  . Drug use: No     Allergies   Cymbalta [duloxetine hcl]   Review of Systems Review of Systems  Constitutional: Negative for chills and fever.  HENT: Negative for ear pain and sore throat.   Eyes: Negative for pain and visual disturbance.  Respiratory: Negative for cough and shortness of breath.   Cardiovascular: Negative for chest pain and palpitations.  Gastrointestinal: Negative for abdominal pain and vomiting.  Genitourinary: Negative for dysuria and hematuria.  Musculoskeletal: Positive for  myalgias ( left forearm, back of bicep and left shoulder). Negative for arthralgias and back pain.  Skin: Negative for color change and rash.  Neurological: Negative for seizures and syncope.  All other systems reviewed and are negative.    Physical Exam Triage Vital Signs ED Triage Vitals [07/30/17 1212]  Enc Vitals Group     BP 123/72     Pulse Rate 93     Resp 18     Temp 98.5 F (36.9 C)  Temp Source Oral     SpO2 95 %     Weight      Height      Head Circumference      Peak Flow      Pain Score      Pain Loc      Pain Edu?      Excl. in [place]?    No data found.  Updated Vital Signs BP 123/72 (BP Location: Right Arm)   Pulse 93   Temp 98.5 F (36.9 C) (Oral)   Resp 18   SpO2 95%   Visual Acuity Right Eye Distance:   Left Eye Distance:   Bilateral Distance:    Right Eye Near:   Left Eye Near:    Bilateral Near:     Physical Exam  Constitutional: She is oriented to person, place, and time. She appears well-developed and well-nourished.  HENT:  Head: Normocephalic and atraumatic.  Eyes: Conjunctivae are normal.  Neck: Normal range of motion.  Pulmonary/Chest: Effort normal.  Musculoskeletal: Normal range of motion. She exhibits no edema, tenderness or deformity.  Neurological: She is alert and oriented to person, place, and time. No sensory deficit. She exhibits normal muscle tone.  Skin: Skin is warm and dry. Unable to access to nail polish. No rash noted. No erythema.  Psychiatric: She has a normal mood and affect.  Nursing note and vitals reviewed.    UC Treatments / Results  Labs (all labs ordered are listed, but only abnormal results are displayed) Labs Reviewed - No data to display  EKG None  Radiology No results found.  Procedures Procedures (including critical care time)  Medications Ordered in UC Medications - No data to display  Initial Impression / Assessment and Plan / UC Course  I have reviewed the triage vital signs and the  nursing notes.  Pertinent labs & imaging results that were available during my care of the patient were reviewed by me and considered in my medical decision making (see chart for details).      Final Clinical Impressions(s) / UC Diagnoses   Final diagnoses:  None   Discharge Instructions   None    ED Prescriptions    None     Controlled Substance Prescriptions [place] Controlled Substance Registry consulted? Not Applicable   Jacqualine Mau, NP 07/30/17 1239    Jacqualine Mau, NP 07/30/17 1247

## 2017-07-30 NOTE — ED Triage Notes (Signed)
Pt c/o L arm pain since easter Sunday. Denies injury.

## 2017-08-01 ENCOUNTER — Telehealth: Payer: Self-pay | Admitting: Internal Medicine

## 2017-08-01 NOTE — Telephone Encounter (Signed)
Patient called to ask if it was okay to take diclofenac (VOLTAREN) 75 MG EC tablet clonazePAM (KLONOPIN) 0.5 MG tablet  Because they were prescribed when she went to urgent care. Please follow up.

## 2017-08-02 NOTE — Telephone Encounter (Signed)
Pt instructed not to take the medications together or before driving. Informed that medications may cause drowsiness when taken.  She denies history in chart of COPD. Denies having kidney problems.  Please advise

## 2017-08-02 NOTE — Telephone Encounter (Signed)
Unable to reach patient. Left message on voicemail to return call.  

## 2017-08-03 ENCOUNTER — Encounter (INDEPENDENT_AMBULATORY_CARE_PROVIDER_SITE_OTHER): Payer: Self-pay | Admitting: Orthopaedic Surgery

## 2017-08-03 ENCOUNTER — Ambulatory Visit (INDEPENDENT_AMBULATORY_CARE_PROVIDER_SITE_OTHER): Payer: Self-pay | Admitting: Orthopaedic Surgery

## 2017-08-03 VITALS — BP 150/88 | HR 79 | Ht 63.0 in | Wt 193.0 lb

## 2017-08-03 DIAGNOSIS — M4802 Spinal stenosis, cervical region: Secondary | ICD-10-CM

## 2017-08-03 NOTE — Progress Notes (Signed)
Office Visit Note   Patient: Charlene Allen           Date of Birth: 1955-06-29           MRN: [id number] Visit Date: 08/03/2017              Requested by: Ladell Pier, MD [address] [place], [place] [address] PCP: Ladell Pier, MD   Assessment & Plan: Visit Diagnoses:  1. Spinal stenosis of cervical region   2.     Probable cubital tunnel syndrome.  Plan: Patient already had a prescription for Tylenol No. 4.  Scheduled for nerve conduction velocities EMGs to make sure she does not have cubital tunnel syndrome on the left.  We reviewed the MRI scan I gave her a copy of the report.  She has severe stenosis at C4-5 C5-6.  She has slight progression of spondylosis at C6-7 and C7-T1.  Follow-up after electrical test for review to determine if she also needs left  cubital tunnel release.    Follow-Up Instructions: No follow-ups on file.   Orders:  Orders Placed This Encounter  Procedures  . Ambulatory referral to Physical Medicine Rehab   No orders of the defined types were placed in this encounter.     Procedures: No procedures performed   Clinical Data: No additional findings.   Subjective: Chief Complaint  Patient presents with  . Neck - Pain, Follow-up    MRI review  . Left Arm - Pain, Follow-up    HPI 62 year old female with neck and left arm pain since 07/30/2017.  She had a visit 426 to the emergency room with left side shoulder and chest wall pain and had normal cardiac catheterization 1 year ago.  She states she is had pain down her left arm radiates into the ring and small finger with numbness and tingling.  She points to the cubital tunnel is an area where she has pain.  She has pain when she turns her neck and states the pain is constant severe day and night.  No fever or chills.  Turns rotates her neck the pain is reproduced.  She also gets some reproduction of pain with her elbow in a flexed position she has to sleep with it extended and placed  on a pillow.  She denies chills or fever no shortness of breath no substernal pain.  Patient been treated with anti-inflammatories muscle relaxants heat and ice.  Klonopin, Tylenol, diclofenac.  Cervical MRI scan was performed on 4/29/ 2019 and is available for review.  Review of Systems cardiac catheterization 2008 with 55 to 65% ejection fraction no stenosis minimal nonobstructive coronary artery disease.  Severe cervical stenosis C4-5 C5-6 by MRI scan 07/25/2017.  History of substance abuse, COPD Gold 0.  Morbid obesity due to excess calories.,  Hypertension, back pain, chronic neck pain.  Patient previously had bilateral carpal tunnel releases.   Objective: Vital Signs: BP (!) 150/88   Pulse 79   Ht 5\' 3"  (1.6 m)   Wt 193 lb (87.5 kg)   BMI 34.19 kg/m   Physical Exam  Constitutional: She is oriented to person, place, and time. She appears well-developed and well-nourished. She appears distressed.  HENT:  Head: Normocephalic.  Right Ear: External ear normal.  Left Ear: External ear normal.  Eyes: Pupils are equal, round, and reactive to light.  Neck: No tracheal deviation present. No thyromegaly present.  Cardiovascular: Normal rate.  Pulmonary/Chest: Effort normal.  Abdominal: Soft.  Obese.  Neurological: She is alert and oriented to person, place, and time.  Skin: Skin is warm and dry. She is not diaphoretic.  Psychiatric: She has a normal mood and affect. Her behavior is normal.    Ortho Exam patient has increased pain with cervical compression severe left brachial plexus tenderness moderately severe on the right.  Positive Lhermitte.  No lower extremity hyperreflexia.  Upper extremity reflexes are 2+.  She has distention and some wrist flexion weakness.  Positive Tinel's over the  Left cubital tunnel.  Slight triceps weakness on the left normal on the right.  Increased sensation ulnar distribution on the left normal on the right.  No tenderness over left Guyon's canal.  Knee and  ankle jerk are 2+ no lower extremity clonus.  Normal heel toe gait.  Negative Tinel's over right cubital tunnel  Specialty Comments:  No specialty comments available.  Imaging:Study Result   CLINICAL DATA:  62 y/o F; chronic neck pain with pain radiating down bilateral arms to the elbows.  EXAM: MRI CERVICAL SPINE WITHOUT CONTRAST  TECHNIQUE: Multiplanar, multisequence MR imaging of the cervical spine was performed. No intravenous contrast was administered.  COMPARISON:  02/02/2016 MRI of the cervical spine.  FINDINGS: Alignment: Straightening of cervical lordosis. Grade 1 C3-4 anterolisthesis.  Vertebrae: Mild endplate edema at the C3 and C4 levels, likely degenerative. No abnormal intervertebral disc signal.  Cord: No abnormal cord signal.  Posterior Fossa, vertebral arteries, paraspinal tissues: Enlarged right posterior cervical chain lymph node at the C4-5 level measuring 13 x 11 mm.  Disc levels:  C2-3: No significant disc displacement, foraminal stenosis, or canal stenosis.  C3-4: Stable disc osteophyte complex with severe right and moderate left uncovertebral and facet hypertrophy. Severe right and moderate left foraminal stenosis. Mild canal stenosis.  C4-5: Stable disc osteophyte complex with left-greater-than-right uncovertebral and facet hypertrophy. Severe left and moderate right foraminal stenosis. Severe canal stenosis with cord impingement.  C5-6: Stable disc osteophyte complex eccentric to right subarticular zone with bilateral uncovertebral and facet hypertrophy. Moderate bilateral foraminal stenosis. Severe canal stenosis with right greater than left cord impingement.  C6-7: Disc osteophyte complex with left-greater-than-right uncovertebral and facet hypertrophy. Mild right and moderate left foraminal stenosis. No significant canal stenosis.  C7-T1: Disc osteophyte complex with uncovertebral and facet hypertrophy resulting in mild  bilateral foraminal stenosis. No canal stenosis.  IMPRESSION: 1. Advanced cervical spondylosis with mildly progressed at C6-7 and C7-T1. 2. 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 3. Stable severe right C3-4 and severe left C4-5 foraminal stenosis. Multilevel mild and moderate foraminal stenosis. 4. No acute osseous abnormality. 5. Enlarged right posterior cervical chain lymph node at C4-5 level, probably reactive lymphadenopathy, clinical correlation recommended.   Electronically Signed   By: Kristine Garbe M.D.   On: 07/25/2017 17:36        PMFS History: Patient Active Problem List   Diagnosis Date Noted  . Spinal stenosis of cervical region 08/05/2017  . Increased endometrial stripe thickness 09/08/2016  . Chronic neck pain 08/16/2016  . Chronic right shoulder pain 07/22/2016  . Morbid obesity due to excess calories ([place]) 06/20/2016  . COPD GOLD 0 still smoking  06/18/2016  . HPV test positive 10/13/2015  . Precordial chest pain 09/09/2015  . Mild diastolic dysfunction 62/13/0865  . Chronic diarrhea 08/13/2015  . Cigarette smoker 08/13/2015  . Chronic back pain 03/15/2013  . Essential hypertension, benign 03/15/2013  . History of substance abuse 03/15/2013   Past Medical History:  Diagnosis Date  . Hypertension Dx 2015  . Obesity   . PMB (postmenopausal bleeding)     Family History  Problem Relation Age of Onset  . COPD Mother   . Stroke Mother   . Heart disease Mother   . Hypertension Mother   . Cancer Sister 25       cervical cancer   . Stroke Maternal Grandmother     Past Surgical History:  Procedure Laterality Date  . ABDOMINAL AORTOGRAM N/A 07/12/2016   Procedure: Abdominal Aortogram;  Surgeon: Jettie Booze, MD;  Location: [facility];  Service: Cardiovascular;  Laterality: N/A;  . HAND SURGERY    . LEFT HEART CATH AND CORONARY ANGIOGRAPHY N/A 07/12/2016   Procedure: Left Heart  Cath and Coronary Angiography;  Surgeon: Jettie Booze, MD;  Location: [facility];  Service: Cardiovascular;  Laterality: N/A;  . TUBAL LIGATION     Social History   Occupational History  . Not on file  Tobacco Use  . Smoking status: Current Every Day Smoker    Packs/day: 1.00    Years: 40.00    Pack years: 40.00  . Smokeless tobacco: Never Used  Substance and Sexual Activity  . Alcohol use: No    Alcohol/week: 0.0 oz  . Drug use: No  . Sexual activity: Not Currently    Birth control/protection: None

## 2017-08-03 NOTE — Telephone Encounter (Signed)
08/12/17 - pt informed of message per Dr. Wynetta Emery.  Pt wanted to inform Dr. Wynetta Emery that she was going to have surgery on the 08/12/17 on her neck and shoulder.   Best wishes for full recovery was sent to patient. Instructed to call if we could be of assistance.

## 2017-08-05 ENCOUNTER — Encounter (INDEPENDENT_AMBULATORY_CARE_PROVIDER_SITE_OTHER): Payer: Self-pay | Admitting: Orthopaedic Surgery

## 2017-08-05 DIAGNOSIS — M4802 Spinal stenosis, cervical region: Secondary | ICD-10-CM | POA: Insufficient documentation

## 2017-08-10 ENCOUNTER — Ambulatory Visit (INDEPENDENT_AMBULATORY_CARE_PROVIDER_SITE_OTHER): Payer: Self-pay | Admitting: Orthopaedic Surgery

## 2017-08-10 MED FILL — METHOCARBAMOL 500 MG TABS: 500 | 20 days supply | Qty: 60 | Fill #1

## 2017-08-12 ENCOUNTER — Telehealth (INDEPENDENT_AMBULATORY_CARE_PROVIDER_SITE_OTHER): Payer: Self-pay | Admitting: *Deleted

## 2017-08-12 ENCOUNTER — Ambulatory Visit (INDEPENDENT_AMBULATORY_CARE_PROVIDER_SITE_OTHER): Payer: Self-pay | Admitting: Physical Medicine and Rehabilitation

## 2017-08-12 ENCOUNTER — Encounter (INDEPENDENT_AMBULATORY_CARE_PROVIDER_SITE_OTHER): Payer: Self-pay | Admitting: Physical Medicine and Rehabilitation

## 2017-08-12 DIAGNOSIS — R202 Paresthesia of skin: Secondary | ICD-10-CM

## 2017-08-12 NOTE — Telephone Encounter (Signed)
I called and advised patient of Dr. [name] message she states that she understands and she has appt on 08/17/17 to see Dr. [name]

## 2017-08-12 NOTE — Telephone Encounter (Signed)
Please advise 

## 2017-08-12 NOTE — Telephone Encounter (Signed)
Someone else gave her tylenol #4. With her Hx will have to wait until after surgery for stronger pain meds. ucall thanks. I tried to call her and no answer.

## 2017-08-12 NOTE — Progress Notes (Signed)
 .  Numeric Pain Rating Scale and Functional Assessment Average Pain 9   In the last MONTH (on 0-10 scale) has pain interfered with the following?  1. General activity like being  able to carry out your everyday physical activities such as walking, climbing stairs, carrying groceries, or moving a chair?  Rating(9)   +Driver, -BT, -Dye Allergies.  

## 2017-08-16 IMAGING — CR DG CHEST 2V
2 series · 2 of 2 positions shown · non-contrast
Comparison: 06/29/2005.

CLINICAL DATA: Hypertension.  Shortness of breath.

EXAM:
CHEST  2 VIEW

[w chest pa]
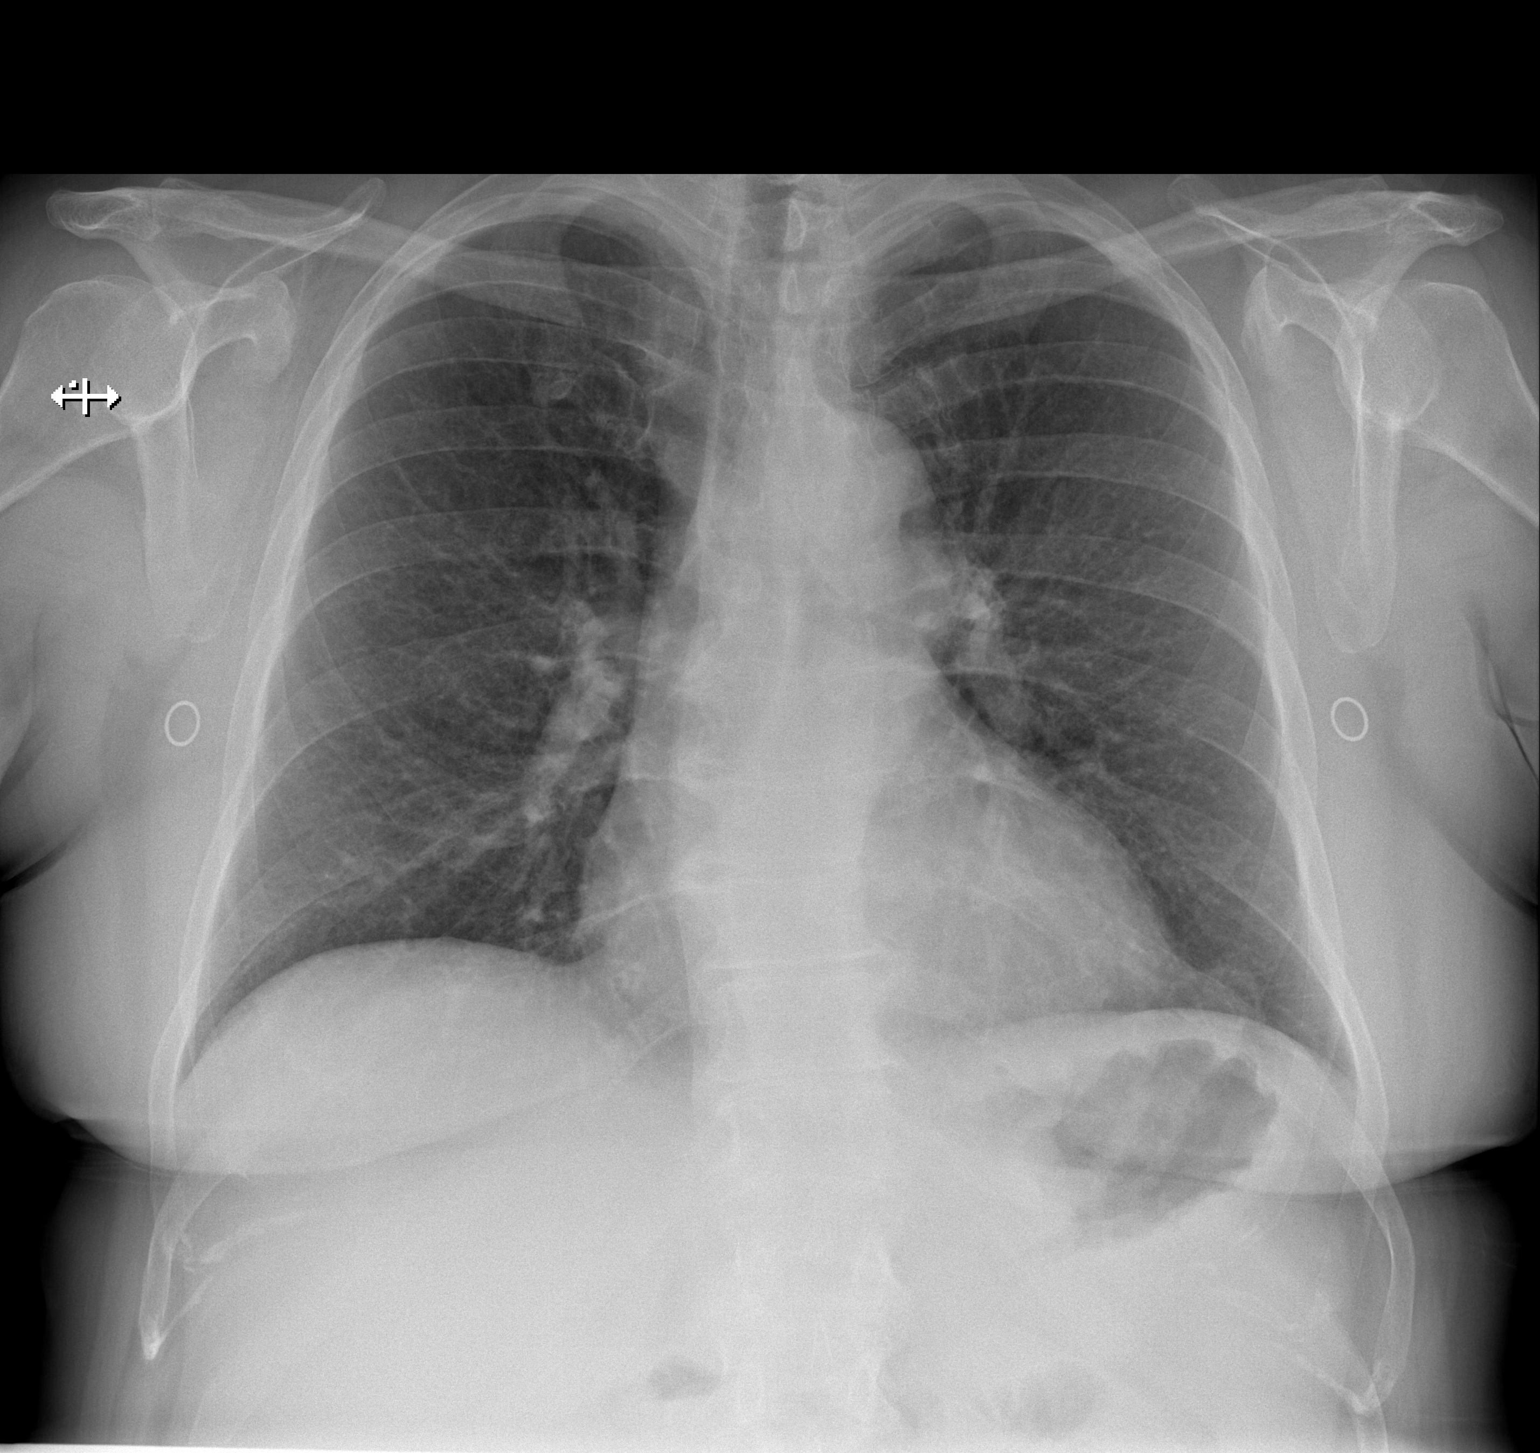

[w chest lat]
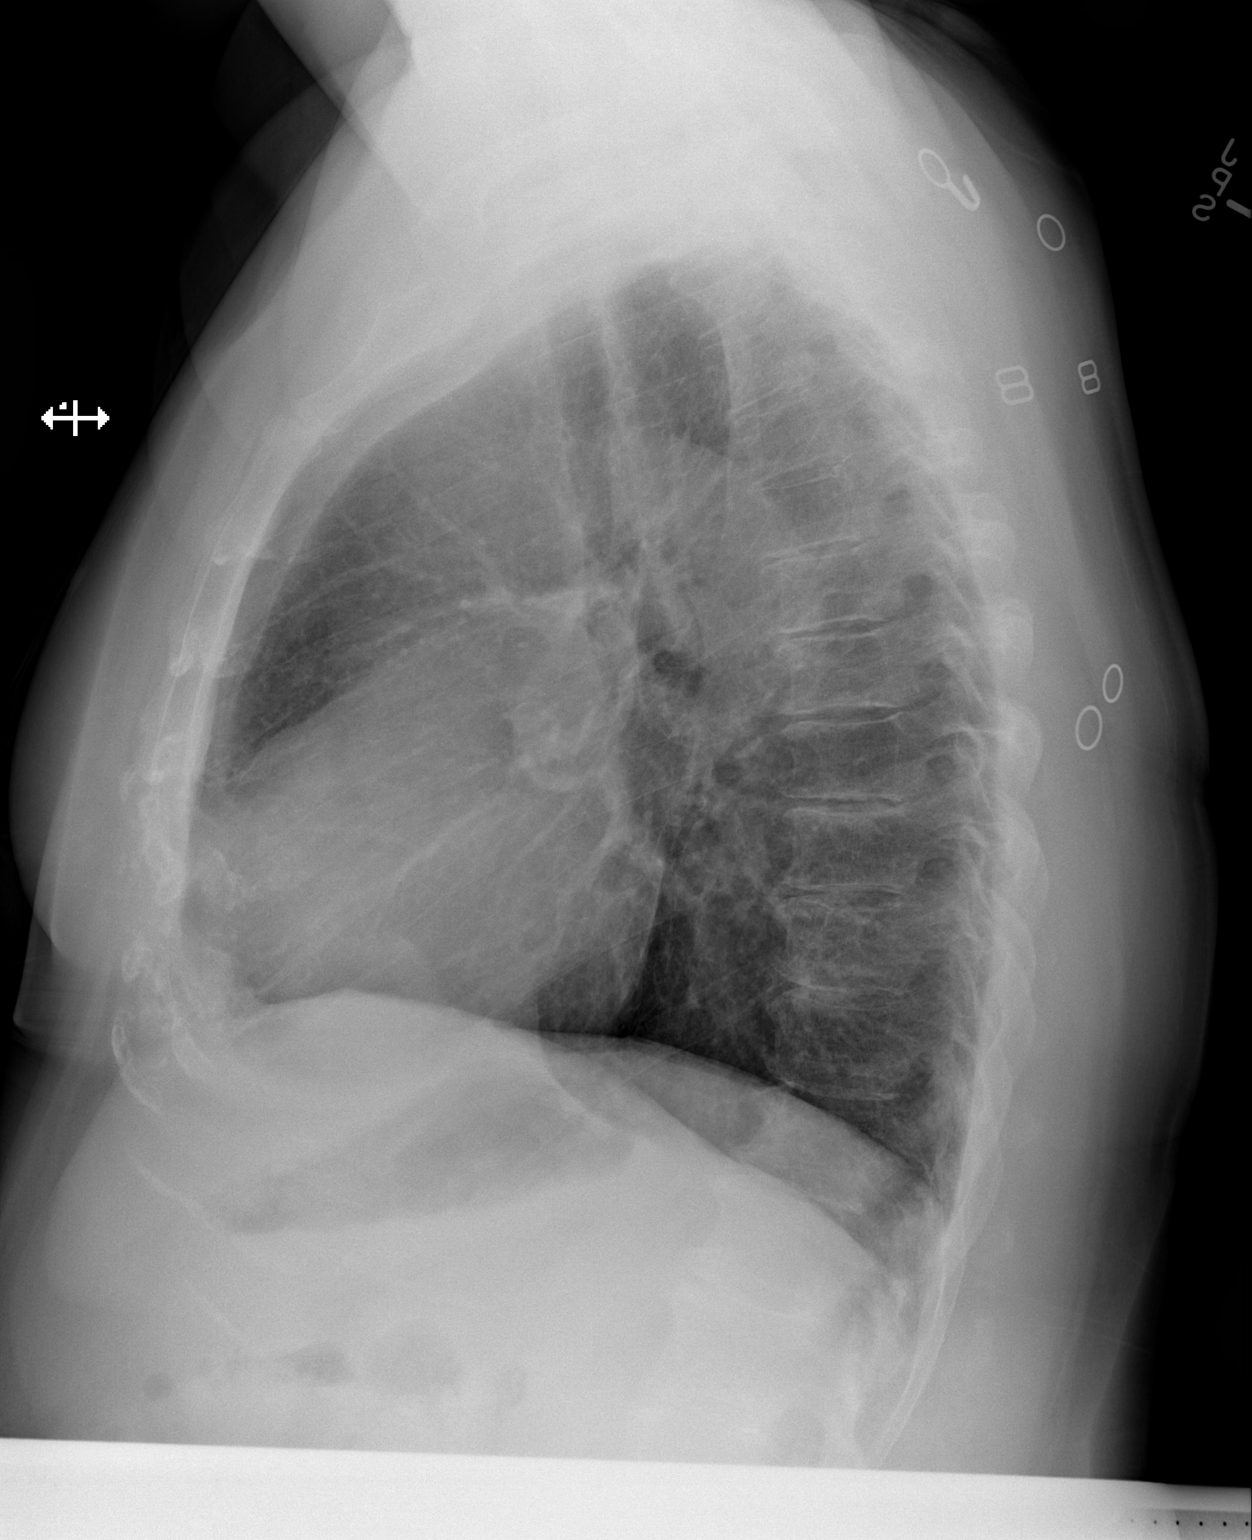

[2 of 2 positions shown; findings below may reference images not displayed]

FINDINGS: Cardiomegaly with very mild interstitial prominence. A very mild
component of congestive heart failure cannot be excluded. Mild
changes of interstitial pneumonitis cannot be excluded. No pleural
effusion or pneumothorax. Degenerative changes thoracic spine.
IMPRESSION: Cardiomegaly with very mild bilateral from interstitial prominence.
Mild congestive heart failure cannot be completely excluded . Mild
pneumonitis cannot be excluded.

## 2017-08-17 ENCOUNTER — Encounter (INDEPENDENT_AMBULATORY_CARE_PROVIDER_SITE_OTHER): Payer: Self-pay | Admitting: Orthopaedic Surgery

## 2017-08-17 ENCOUNTER — Ambulatory Visit (INDEPENDENT_AMBULATORY_CARE_PROVIDER_SITE_OTHER): Payer: Self-pay | Admitting: Orthopaedic Surgery

## 2017-08-17 VITALS — BP 149/88 | HR 69 | Ht 63.0 in | Wt 183.0 lb

## 2017-08-17 DIAGNOSIS — M4802 Spinal stenosis, cervical region: Secondary | ICD-10-CM

## 2017-08-17 NOTE — Progress Notes (Signed)
Office Visit Note   Patient: Charlene Allen           Date of Birth: Aug 01, 1955           MRN: [id number] Visit Date: 08/17/2017              Requested by: Ladell Pier, MD [address] [place], [place] [address] PCP: Ladell Pier, MD   Assessment & Plan: Visit Diagnoses:  1. Spinal stenosis of cervical region     Plan: 2 level cervical fusion discussed.  MRI scan is reviewed rationale for treatment was discussed questions were elicited and answered postoperative soft collar usage discussed.  She understands and requests we proceed.  Follow-Up Instructions: No follow-ups on file.   Orders:  No orders of the defined types were placed in this encounter.  No orders of the defined types were placed in this encounter.     Procedures: No procedures performed   Clinical Data: No additional findings.   Subjective: Chief Complaint  Patient presents with  . Neck - Pain, Follow-up    Post NCS    HPI 62-year-old female with persistent neck pain cervical spondylosis with severe stenosis at C4-5, C5-6.  Slight progression of spondylosis C7-T1 and also C6-7 by serial MRI scan.  Electrical test have been performed and cubital tunnel is been ruled out.  Her pain is severe and is been taking Tylenol No. 4.  No myelopathy.  No chills or fever no shortness of breath.  Review of Systems is systems updated unchanged from 08/03/2017 ejection fraction at 55 to 65%.  History of substance abuse COPD Gold 0.  Morbid obesity, hypertension chronic neck and back pain previous bilateral carpal tunnel releases.  Recent cubital tunnel test negative for ulnar nerve compression at the elbow or hand.   Objective: Vital Signs: BP (!) 149/88   Pulse 69   Ht 5\' 3"  (1.6 m)   Wt 183 lb (83 kg)   BMI 32.42 kg/m   Physical Exam  Constitutional: She is oriented to person, place, and time. She appears well-developed.  HENT:  Head: Normocephalic.  Right Ear: External ear normal.  Left  Ear: External ear normal.  Eyes: Pupils are equal, round, and reactive to light.  Neck: No tracheal deviation present. No thyromegaly present.  Cardiovascular: Normal rate.  Pulmonary/Chest: Effort normal.  Abdominal: Soft.  Neurological: She is alert and oriented to person, place, and time.  Skin: Skin is warm and dry.  Psychiatric: She has a normal mood and affect. Her behavior is normal.    Ortho Exam bilateral cervical brachial plexus tenderness.  Increased pain with compression.  More left than right positive Spurling.  No lower extremity hyperreflexia upper extremity reflexes are 2+ and symmetrical.  Mild wrist flexion weakness.  Still positive Tinel's over the cubital tunnel triceps weakness on the left versus normal on the right.  Decreased sensation long finger on the left.  Knee and ankle jerk are intact normal heel toe gait.  Specialty Comments:  No specialty comments available.  Imaging: No results found.   PMFS History: Patient Active Problem List   Diagnosis Date Noted  . Spinal stenosis of cervical region 08/05/2017  . Increased endometrial stripe thickness 09/08/2016  . Chronic neck pain 08/16/2016  . Chronic right shoulder pain 07/22/2016  . Morbid obesity due to excess calories ([place]) 06/20/2016  . COPD GOLD 0 still smoking  06/18/2016  . HPV test positive 10/13/2015  . Precordial chest pain 09/09/2015  .  Mild diastolic dysfunction 57/84/6962  . Chronic diarrhea 08/13/2015  . Cigarette smoker 08/13/2015  . Chronic back pain 03/15/2013  . Essential hypertension, benign 03/15/2013  . History of substance abuse 03/15/2013   Past Medical History:  Diagnosis Date  . Hypertension Dx 2015  . Obesity   . PMB (postmenopausal bleeding)     Family History  Problem Relation Age of Onset  . COPD Mother   . Stroke Mother   . Heart disease Mother   . Hypertension Mother   . Cancer Sister 25       cervical cancer   . Stroke Maternal Grandmother     Past Surgical  History:  Procedure Laterality Date  . ABDOMINAL AORTOGRAM N/A 07/12/2016   Procedure: Abdominal Aortogram;  Surgeon: Jettie Booze, MD;  Location: [facility];  Service: Cardiovascular;  Laterality: N/A;  . HAND SURGERY    . LEFT HEART CATH AND CORONARY ANGIOGRAPHY N/A 07/12/2016   Procedure: Left Heart Cath and Coronary Angiography;  Surgeon: Jettie Booze, MD;  Location: [facility];  Service: Cardiovascular;  Laterality: N/A;  . TUBAL LIGATION     Social History   Occupational History  . Not on file  Tobacco Use  . Smoking status: Current Every Day Smoker    Packs/day: 1.00    Years: 40.00    Pack years: 40.00  . Smokeless tobacco: Never Used  Substance and Sexual Activity  . Alcohol use: No    Alcohol/week: 0.0 oz  . Drug use: No  . Sexual activity: Not Currently    Birth control/protection: None      62 year old white female with history of C4-5 and C5-6 H&P left upper extremity radiculopathy is being seen for preop evaluation.  She was evaluated with Dr. [name] this morning and surgery scheduled for C4-5 and C5-6 ACDF.  I reviewed patient's history.  She had a history of COPD and was seen by pulmonologist Dr. Melvyn Novas March 2018 and was scheduled for follow-up visit 6 weeks after patient did not keep that appointment.  Patient states only inhaler that she is using now is albuterol.  Has not been using Symbicort.  She does admit to exertional dyspnea.  Continues to smoke.  Has been seen by cardiologist in 2018 and had heart catheterization.  I do not see documented follow-up after that study was done.  Patient was seen in the emergency room a month ago for left-sided chest pain and was diagnosed as muscular pain.  Reviewed this with Dr. [name] he is recommending medical clearance.  Patient was given form today.

## 2017-08-17 NOTE — Procedures (Signed)
EMG & NCV Findings: Evaluation of the left median motor nerve showed reduced amplitude (3.6 mV).  All remaining nerves (as indicated in the following tables) were within normal limits.    Needle evaluation of the left triceps and the left biceps muscles showed increased insertional activity and diminished recruitment.  All remaining muscles (as indicated in the following table) showed no evidence of electrical instability.    Impression: The above electrodiagnostic study is ABNORMAL and reveals evidence of mild chronic C6 radiculopathy on the left.  The lack of pathology seen on nerve conductions does point more to a cervical region for her symptoms.  Central nervous system lesions obviously are not going to be seen on this particular type of study.  There is no significant electrodiagnostic evidence of any other focal nerve entrapment or brachial plexopathy.   Recommendations: 1.  Follow-up with referring physician. 2.  Continue current management of symptoms. 3.  Suggest surgical evaluation due cervical spine MRI findings. 4.  Consider nerve membrane stabilizing medications such as Elavil, Neurontin, Lyrica or Cymbalta if not already tried.   Nerve Conduction Studies Anti Sensory Summary Table   Stim Site NR Peak (ms) Norm Peak (ms) P-T Amp (V) Norm P-T Amp Site1 Site2 Delta-P (ms) Dist (cm) Vel (m/s) Norm Vel (m/s)  Left Median Acr Palm Anti Sensory (2nd Digit)  33.7C  Wrist    3.4 <3.6 31.4 >10 Wrist Palm 2.2 0.0    Palm    1.2 <2.0 10.6         Left Radial Anti Sensory (Base 1st Digit)  35.1C  Wrist    1.9 <3.1 29.4  Wrist Base 1st Digit 1.9 0.0    Left Ulnar Anti Sensory (5th Digit)  34.7C  Wrist    2.9 <3.7 18.5 >15.0 Wrist 5th Digit 2.9 14.0 48 >38   Motor Summary Table   Stim Site NR Onset (ms) Norm Onset (ms) O-P Amp (mV) Norm O-P Amp Site1 Site2 Delta-0 (ms) Dist (cm) Vel (m/s) Norm Vel (m/s)  Left Median Motor (Abd Poll Brev)  35.1C  Wrist    3.8 <4.2 *3.6 >5 Elbow  Wrist 3.6 19.0 53 >50  Elbow    7.4  3.5         Left Ulnar Motor (Abd Dig Min)  34.9C  Wrist    2.7 <4.2 8.1 >3 B Elbow Wrist 2.7 18.5 69 >53  B Elbow    5.4  8.1  A Elbow B Elbow 1.4 9.5 68 >53  A Elbow    6.8  9.0          EMG   Side Muscle Nerve Root Ins Act Fibs Psw Amp Dur Poly Recrt Int Fraser Din Comment  Left 1stDorInt Ulnar C8-T1 Nml Nml Nml Nml Nml 0 Nml Nml   Left Abd Poll Brev Median C8-T1 Nml Nml Nml Nml Nml 0 Nml Nml   Left ExtDigCom   Nml Nml Nml Nml Nml 0 Nml Nml   Left Triceps Radial C6-7-8 *Incr Nml Nml Nml Nml 0 *Reduced Nml   Left Deltoid Axillary C5-6 Nml Nml Nml Nml Nml 0 Nml Nml   Left Biceps Musculocut C5-6 *Incr Nml Nml Nml Nml 0 *Reduced Nml     Nerve Conduction Studies Anti Sensory Left/Right Comparison   Stim Site L Lat (ms) R Lat (ms) L-R Lat (ms) L Amp (V) R Amp (V) L-R Amp (%) Site1 Site2 L Vel (m/s) R Vel (m/s) L-R Vel (m/s)  Median Acr Palm Anti Sensory (  2nd Digit)  33.7C  Wrist 3.4   31.4   Wrist Palm     Palm 1.2   10.6         Radial Anti Sensory (Base 1st Digit)  35.1C  Wrist 1.9   29.4   Wrist Base 1st Digit     Ulnar Anti Sensory (5th Digit)  34.7C  Wrist 2.9   18.5   Wrist 5th Digit 48     Motor Left/Right Comparison   Stim Site L Lat (ms) R Lat (ms) L-R Lat (ms) L Amp (mV) R Amp (mV) L-R Amp (%) Site1 Site2 L Vel (m/s) R Vel (m/s) L-R Vel (m/s)  Median Motor (Abd Poll Brev)  35.1C  Wrist 3.8   *3.6   Elbow Wrist 53    Elbow 7.4   3.5         Ulnar Motor (Abd Dig Min)  34.9C  Wrist 2.7   8.1   B Elbow Wrist 69    B Elbow 5.4   8.1   A Elbow B Elbow 68    A Elbow 6.8   9.0            Waveforms:

## 2017-08-17 NOTE — Progress Notes (Signed)
Charlene Allen - 62 y.o. female MRN [id number]  Date of birth: Feb 25, 1956  Office Visit Note: Visit Date: 08/12/2017 PCP: Ladell Pier, MD Referred by: Ladell Pier, MD  Subjective: Chief Complaint  Patient presents with  . Left Arm - Numbness, Pain  . Left Hand - Tingling   HPI: Charlene Allen is a 62 year old right-hand dominant female who comes in at the request of Dr. Rodell Perna for electrodiagnostic study of the left upper arm.  She reports pain and numbness in the left arm radiating down the arm with tingling in the left hand and more of the thumb and index finger more of a C6 distribution.  She does however get a lot of pain around the posterior aspect of the left elbow and really has a hard time laying her arm down or touching anything.  Somewhat of an allodynia.  She reports that any movement makes it worse and nothing is made it better including medications and Tylenol No. 4.  She really reports not being able to pick anything up with her left arm.  She reports this started about a month ago and has been very severe and she rates her pain as a 9 out of 10.  She seems distraught today with the amount of pain.  She has had cervical MRI which is reviewed below.  This shows severe multifactorial stenosis at C4-5 and C5-6.  Dr. [name] specifically was looking at possible concomitant cubital tunnel syndrome.   ROS Otherwise per HPI.  Assessment & Plan: Visit Diagnoses:  1. Paresthesia of skin     Plan: No additional findings.  Impression: The above electrodiagnostic study is ABNORMAL and reveals evidence of mild chronic C6 radiculopathy on the left.  The lack of pathology seen on nerve conductions does point more to a cervical region for her symptoms.  Central nervous system lesions obviously are not going to be seen on this particular type of study.  There is no significant electrodiagnostic evidence of any other focal nerve entrapment or brachial plexopathy.    Recommendations: 1.  Follow-up with referring physician. 2.  Continue current management of symptoms. 3.  Suggest surgical evaluation due cervical spine MRI findings. 4.  Consider nerve membrane stabilizing medications such as Elavil, Neurontin, Lyrica or Cymbalta if not already tried.   Meds & Orders: No orders of the defined types were placed in this encounter.   Orders Placed This Encounter  Procedures  . NCV with EMG (electromyography)    Follow-up: Return for Dr. Rodell Perna.   Procedures: No procedures performed  EMG & NCV Findings: Evaluation of the left median motor nerve showed reduced amplitude (3.6 mV).  All remaining nerves (as indicated in the following tables) were within normal limits.    Needle evaluation of the left triceps and the left biceps muscles showed increased insertional activity and diminished recruitment.  All remaining muscles (as indicated in the following table) showed no evidence of electrical instability.    Impression: The above electrodiagnostic study is ABNORMAL and reveals evidence of mild chronic C6 radiculopathy on the left.  The lack of pathology seen on nerve conductions does point more to a cervical region for her symptoms.  Central nervous system lesions obviously are not going to be seen on this particular type of study.  There is no significant electrodiagnostic evidence of any other focal nerve entrapment or brachial plexopathy.   Recommendations: 1.  Follow-up with referring physician. 2.  Continue current management of symptoms. 3.  Suggest surgical evaluation due cervical spine MRI findings. 4.  Consider nerve membrane stabilizing medications such as Elavil, Neurontin, Lyrica or Cymbalta if not already tried.   Nerve Conduction Studies Anti Sensory Summary Table   Stim Site NR Peak (ms) Norm Peak (ms) P-T Amp (V) Norm P-T Amp Site1 Site2 Delta-P (ms) Dist (cm) Vel (m/s) Norm Vel (m/s)  Left Median Acr Palm Anti Sensory (2nd  Digit)  33.7C  Wrist    3.4 <3.6 31.4 >10 Wrist Palm 2.2 0.0    Palm    1.2 <2.0 10.6         Left Radial Anti Sensory (Base 1st Digit)  35.1C  Wrist    1.9 <3.1 29.4  Wrist Base 1st Digit 1.9 0.0    Left Ulnar Anti Sensory (5th Digit)  34.7C  Wrist    2.9 <3.7 18.5 >15.0 Wrist 5th Digit 2.9 14.0 48 >38   Motor Summary Table   Stim Site NR Onset (ms) Norm Onset (ms) O-P Amp (mV) Norm O-P Amp Site1 Site2 Delta-0 (ms) Dist (cm) Vel (m/s) Norm Vel (m/s)  Left Median Motor (Abd Poll Brev)  35.1C  Wrist    3.8 <4.2 *3.6 >5 Elbow Wrist 3.6 19.0 53 >50  Elbow    7.4  3.5         Left Ulnar Motor (Abd Dig Min)  34.9C  Wrist    2.7 <4.2 8.1 >3 B Elbow Wrist 2.7 18.5 69 >53  B Elbow    5.4  8.1  A Elbow B Elbow 1.4 9.5 68 >53  A Elbow    6.8  9.0          EMG   Side Muscle Nerve Root Ins Act Fibs Psw Amp Dur Poly Recrt Int Fraser Din Comment  Left 1stDorInt Ulnar C8-T1 Nml Nml Nml Nml Nml 0 Nml Nml   Left Abd Poll Brev Median C8-T1 Nml Nml Nml Nml Nml 0 Nml Nml   Left ExtDigCom   Nml Nml Nml Nml Nml 0 Nml Nml   Left Triceps Radial C6-7-8 *Incr Nml Nml Nml Nml 0 *Reduced Nml   Left Deltoid Axillary C5-6 Nml Nml Nml Nml Nml 0 Nml Nml   Left Biceps Musculocut C5-6 *Incr Nml Nml Nml Nml 0 *Reduced Nml     Nerve Conduction Studies Anti Sensory Left/Right Comparison   Stim Site L Lat (ms) R Lat (ms) L-R Lat (ms) L Amp (V) R Amp (V) L-R Amp (%) Site1 Site2 L Vel (m/s) R Vel (m/s) L-R Vel (m/s)  Median Acr Palm Anti Sensory (2nd Digit)  33.7C  Wrist 3.4   31.4   Wrist Palm     Palm 1.2   10.6         Radial Anti Sensory (Base 1st Digit)  35.1C  Wrist 1.9   29.4   Wrist Base 1st Digit     Ulnar Anti Sensory (5th Digit)  34.7C  Wrist 2.9   18.5   Wrist 5th Digit 48     Motor Left/Right Comparison   Stim Site L Lat (ms) R Lat (ms) L-R Lat (ms) L Amp (mV) R Amp (mV) L-R Amp (%) Site1 Site2 L Vel (m/s) R Vel (m/s) L-R Vel (m/s)  Median Motor (Abd Poll Brev)  35.1C  Wrist 3.8   *3.6   Elbow  Wrist 53    Elbow 7.4   3.5         Ulnar Motor (Abd Dig Min)  34.9C  Wrist  2.7   8.1   B Elbow Wrist 69    B Elbow 5.4   8.1   A Elbow B Elbow 68    A Elbow 6.8   9.0            Waveforms:            Clinical History: MRI CERVICAL SPINE WITHOUT CONTRAST  TECHNIQUE: Multiplanar, multisequence MR imaging of the cervical spine was performed. No intravenous contrast was administered.  COMPARISON:  02/02/2016 MRI of the cervical spine.  FINDINGS: Alignment: Straightening of cervical lordosis. Grade 1 C3-4 anterolisthesis.  Vertebrae: Mild endplate edema at the C3 and C4 levels, likely degenerative. No abnormal intervertebral disc signal.  Cord: No abnormal cord signal.  Posterior Fossa, vertebral arteries, paraspinal tissues: Enlarged right posterior cervical chain lymph node at the C4-5 level measuring 13 x 11 mm.  Disc levels:  C2-3: No significant disc displacement, foraminal stenosis, or canal stenosis.  C3-4: Stable disc osteophyte complex with severe right and moderate left uncovertebral and facet hypertrophy. Severe right and moderate left foraminal stenosis. Mild canal stenosis.  C4-5: Stable disc osteophyte complex with left-greater-than-right uncovertebral and facet hypertrophy. Severe left and moderate right foraminal stenosis. Severe canal stenosis with cord impingement.  C5-6: Stable disc osteophyte complex eccentric to right subarticular zone with bilateral uncovertebral and facet hypertrophy. Moderate bilateral foraminal stenosis. Severe canal stenosis with right greater than left cord impingement.  C6-7: Disc osteophyte complex with left-greater-than-right uncovertebral and facet hypertrophy. Mild right and moderate left foraminal stenosis. No significant canal stenosis.  C7-T1: Disc osteophyte complex with uncovertebral and facet hypertrophy resulting in mild bilateral foraminal stenosis. No canal stenosis.  IMPRESSION: 1.  Advanced cervical spondylosis with mildly progressed at C6-7 and C7-T1. 2. 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 3. Stable severe right C3-4 and severe left C4-5 foraminal stenosis. Multilevel mild and moderate foraminal stenosis. 4. No acute osseous abnormality. 5. Enlarged right posterior cervical chain lymph node at C4-5 level, probably reactive lymphadenopathy, clinical correlation recommended.   Electronically Signed   By: Kristine Garbe M.D.   On: 07/25/2017 17:36   She reports that she has been smoking.  She has a 40.00 pack-year smoking history. She has never used smokeless tobacco. No results for input(s): HGBA1C, LABURIC in the last 8760 hours.  Objective:  VS:  HT:    WT:   BMI:     BP:   HR: bpm  TEMP: ( )  RESP:  Physical Exam  Musculoskeletal:  Patient does reproduce some of her shoulder pain with left-sided rotation and extension and an equivalent Spurling's test to the left.  She has quite a bit of pain with even gentle movement of the arm and palpation around the elbow.  Essentially positive allodynia.  She does not have any trophic changes or distinct swelling or color changes.  She does have good strength with wrist flexion and long finger flexion and abduction.  Sensory exam to light touch is very hard to do because the patient is having a lot of pain.  She has a negative Hoffman's test bilaterally.  Neurological: She exhibits normal muscle tone. Coordination normal.    Ortho Exam Imaging: No results found.  Past Medical/Family/Surgical/Social History: Medications & Allergies reviewed per EMR, new medications updated. Patient Active Problem List   Diagnosis Date Noted  . Spinal stenosis of cervical region 08/05/2017  . Increased endometrial stripe thickness 09/08/2016  . Chronic neck pain 08/16/2016  .  Chronic right shoulder pain 07/22/2016  . Morbid obesity due to excess calories ([place]) 06/20/2016  .  COPD GOLD 0 still smoking  06/18/2016  . HPV test positive 10/13/2015  . Precordial chest pain 09/09/2015  . Mild diastolic dysfunction 46/56/8127  . Chronic diarrhea 08/13/2015  . Cigarette smoker 08/13/2015  . Chronic back pain 03/15/2013  . Essential hypertension, benign 03/15/2013  . History of substance abuse 03/15/2013   Past Medical History:  Diagnosis Date  . Hypertension Dx 2015  . Obesity   . PMB (postmenopausal bleeding)    Family History  Problem Relation Age of Onset  . COPD Mother   . Stroke Mother   . Heart disease Mother   . Hypertension Mother   . Cancer Sister 25       cervical cancer   . Stroke Maternal Grandmother    Past Surgical History:  Procedure Laterality Date  . ABDOMINAL AORTOGRAM N/A 07/12/2016   Procedure: Abdominal Aortogram;  Surgeon: Jettie Booze, MD;  Location: [facility];  Service: Cardiovascular;  Laterality: N/A;  . HAND SURGERY    . LEFT HEART CATH AND CORONARY ANGIOGRAPHY N/A 07/12/2016   Procedure: Left Heart Cath and Coronary Angiography;  Surgeon: Jettie Booze, MD;  Location: [facility];  Service: Cardiovascular;  Laterality: N/A;  . TUBAL LIGATION     Social History   Occupational History  . Not on file  Tobacco Use  . Smoking status: Current Every Day Smoker    Packs/day: 1.00    Years: 40.00    Pack years: 40.00  . Smokeless tobacco: Never Used  Substance and Sexual Activity  . Alcohol use: No    Alcohol/week: 0.0 oz  . Drug use: No  . Sexual activity: Not Currently    Birth control/protection: None

## 2017-08-17 NOTE — H&P (Signed)
Charlene Allen is an 62 y.o. female.   Chief Complaint: Neck pain and left upper extremity radiculopathy HPI: Patient with history of C4-5 and C5-6 severe stenosis and the above complaint presents for preoperative evaluation.  Progressively worsening symptoms.  Patient's medical history reviewed.  She had heart catheterization last year.  History of COPD and was seen by Dr. Christinia Gully March 2018.  Patient states that she did not follow-up with him.  Past Medical History:  Diagnosis Date  . Hypertension Dx 2015  . Obesity   . PMB (postmenopausal bleeding)     Past Surgical History:  Procedure Laterality Date  . ABDOMINAL AORTOGRAM N/A 07/12/2016   Procedure: Abdominal Aortogram;  Surgeon: Jettie Booze, MD;  Location: [facility];  Service: Cardiovascular;  Laterality: N/A;  . HAND SURGERY    . LEFT HEART CATH AND CORONARY ANGIOGRAPHY N/A 07/12/2016   Procedure: Left Heart Cath and Coronary Angiography;  Surgeon: Jettie Booze, MD;  Location: [facility];  Service: Cardiovascular;  Laterality: N/A;  . TUBAL LIGATION      Family History  Problem Relation Age of Onset  . COPD Mother   . Stroke Mother   . Heart disease Mother   . Hypertension Mother   . Cancer Sister 25       cervical cancer   . Stroke Maternal Grandmother    Social History:  reports that she has been smoking.  She has a 40.00 pack-year smoking history. She has never used smokeless tobacco. She reports that she does not drink alcohol or use drugs.  Allergies:  Allergies  Allergen Reactions  . Cymbalta [Duloxetine Hcl]     PUT ME IN A DAZE    No medications prior to admission.    No results found for this or any previous visit (from the past 48 hour(s)). No results found.  Review of Systems  Constitutional: Negative.   HENT: Negative.   Respiratory: Positive for shortness of breath. Negative for cough and sputum production.   Cardiovascular: Negative for chest pain.   Gastrointestinal: Negative.   Genitourinary: Negative.   Musculoskeletal: Positive for joint pain and neck pain.  Skin: Negative.   Neurological: Positive for tingling.  Psychiatric/Behavioral: Negative.     There were no vitals taken for this visit. Physical Exam  Constitutional: She is oriented to person, place, and time. No distress.  HENT:  Head: Normocephalic and atraumatic.  Eyes: Pupils are equal, round, and reactive to light. EOM are normal.  Neck:  Some limitation in cervical spine range of motion due to discomfort.  Left greater than right brachial plexus and trapezius tenderness.  Cardiovascular: Normal rate.  Respiratory: No respiratory distress. She has no rales.  Decreased air exchange bilaterally.  GI: Soft. Bowel sounds are normal. She exhibits no distension. There is no tenderness.  Musculoskeletal:  Left shoulder good range of motion but with discomfort reaching overhead.  Positive impingement test.  Tender along the proximal biceps tendon.  No tendon defect.  Some pain in weakness with supraspinatus resistance.  Neurological: She is alert and oriented to person, place, and time.  Skin: Skin is warm and dry.  Psychiatric: She has a normal mood and affect.     Assessment/Plan C4-5 and C5-6 severe stenosis with neck pain and left upper extremity radiculopathy. COPD History of nicotine abuse  Patient was seen by Dr. [name] before visit with me this morning and he discussed best treatment option would be C4-5 and C5-6 ACDF.  I did discuss surgery procedure in detail with patient along with possible risks and complications including difficult to breathing, difficulty swallowing, pseudoarthrosis and need for repeat surgery.  All questions answered.  I did review patient's medical history and she was last seen by Dr. Melvyn Novas pulmonologist March 2018 but did not schedule follow-up appointment with him as recommended.  She is also not taking the long-acting inhalers that were  previously prescribed.  This information was discussed with Dr. [name] he is recommending preop medical clearance.  Smoking cessation discussed. Benjiman Core, PA-C 08/17/2017, 12:02 PM

## 2017-08-18 ENCOUNTER — Telehealth: Payer: Self-pay

## 2017-08-18 DIAGNOSIS — M5441 Lumbago with sciatica, right side: Principal | ICD-10-CM

## 2017-08-18 DIAGNOSIS — M5442 Lumbago with sciatica, left side: Principal | ICD-10-CM

## 2017-08-18 DIAGNOSIS — G8929 Other chronic pain: Secondary | ICD-10-CM

## 2017-08-19 MED ORDER — ACETAMINOPHEN-CODEINE #4 300-60 MG PO TABS: 1.0000 | ORAL_TABLET | Freq: Four times a day (QID) | ORAL | 0 refills | Status: DC | PRN

## 2017-08-19 NOTE — Telephone Encounter (Signed)
Pt requesting RF on Tyl#3.  NCCSRS reviewed and is appropriate.  Due for RF 08/28/2017.  Rxn written with fill date. Pt scheduled for neck surgery 08/30/2017.

## 2017-08-19 NOTE — Telephone Encounter (Signed)
JA 

## 2017-08-19 NOTE — Telephone Encounter (Signed)
Contacted pt and made aware rx is ready for pick up

## 2017-08-19 NOTE — Addendum Note (Signed)
Addended by: Karle Plumber B on: 08/19/2017 12:34 PM   Modules accepted: Orders

## 2017-08-23 ENCOUNTER — Encounter (INDEPENDENT_AMBULATORY_CARE_PROVIDER_SITE_OTHER): Payer: Self-pay | Admitting: Orthopaedic Surgery

## 2017-08-23 MED FILL — AMLODIPINE BESYLATE 10 MG T: 10 | 30 days supply | Qty: 30 | Fill #7

## 2017-08-23 MED FILL — ?ATORVASTATIN 20 MG TABLET: 20 | 30 days supply | Qty: 30 | Fill #4

## 2017-08-26 NOTE — Pre-Procedure Instructions (Signed)
Trulee Hamstra  08/26/2017      [place] [place] [address] Phone: [phone] Fax: [phone]    Your procedure is scheduled on Monday June 10.  Report to [facility] Admitting at 10:30 A.M.  Call this number if you have problems the morning of surgery:  [phone]   Remember:  No food or drink after midnight.     Take these medicines the morning of surgery with A SIP OF WATER:   Amlodipine (norvasc) Pregabalin (Lyrica) Tylenol if needed Albuterol if needed (please bring inhaler to hospital with you) Flonase if needed Robaxin if needed  7 days prior to surgery STOP taking any Aleve, Naproxen, Ibuprofen, Motrin, Advil, Goody's, BC's, all herbal medications, fish oil, and all vitamins  **Follow your surgeons instructions on stopping Aspirin. If no instructions were given, please call your surgeon's office**    Do not wear jewelry, make-up or nail polish.  Do not wear lotions, powders, or perfumes, or deodorant.  Do not shave 48 hours prior to surgery.  Men may shave face and neck.  Do not bring valuables to the hospital.  [facility] is not responsible for any belongings or valuables.  Contacts, dentures or bridgework may not be worn into surgery.  Leave your suitcase in the car.  After surgery it may be brought to your room.  For patients admitted to the hospital, discharge time will be determined by your treatment team.  Patients discharged the day of surgery will not be allowed to drive home.   Special instructions:     [facility]- Preparing For Surgery  Before surgery, you can play an important role. Because skin is not sterile, your skin needs to be as free of germs as possible. You can reduce the number of germs on your skin by washing with CHG (chlorahexidine gluconate) Soap before surgery.  CHG is an antiseptic cleaner which kills germs and bonds  with the skin to continue killing germs even after washing.    Oral Hygiene is also important to reduce your risk of infection.  Remember - BRUSH YOUR TEETH THE MORNING OF SURGERY WITH YOUR REGULAR TOOTHPASTE  Please do not use if you have an allergy to CHG or antibacterial soaps. If your skin becomes reddened/irritated stop using the CHG.  Do not shave (including legs and underarms) for at least 48 hours prior to first CHG shower. It is OK to shave your face.  Please follow these instructions carefully.   1. Shower the NIGHT BEFORE SURGERY and the MORNING OF SURGERY with CHG.   2. If you chose to wash your hair, wash your hair first as usual with your normal shampoo.  3. After you shampoo, rinse your hair and body thoroughly to remove the shampoo.  4. Use CHG as you would any other liquid soap. You can apply CHG directly to the skin and wash gently with a scrungie or a clean washcloth.   5. Apply the CHG Soap to your body ONLY FROM THE NECK DOWN.  Do not use on open wounds or open sores. Avoid contact with your eyes, ears, mouth and genitals (private parts). Wash Face and genitals (private parts)  with your normal soap.  6. Wash thoroughly, paying special attention to the area where your surgery will be performed.  7. Thoroughly rinse your body with warm water from the neck down.  8. DO NOT  shower/wash with your normal soap after using and rinsing off the CHG Soap.  9. Pat yourself dry with a CLEAN TOWEL.  10. Wear CLEAN PAJAMAS to bed the night before surgery, wear comfortable clothes the morning of surgery  11. Place CLEAN SHEETS on your bed the night of your first shower and DO NOT SLEEP WITH PETS.    Day of Surgery:  Do not apply any deodorants/lotions.  Please wear clean clothes to the hospital/surgery center.   Remember to brush your teeth WITH YOUR REGULAR TOOTHPASTE.     Please read over the following fact sheets that you were given. Coughing and Deep Breathing,  MRSA Information and Surgical Site Infection Prevention

## 2017-08-29 ENCOUNTER — Other Ambulatory Visit: Payer: Self-pay

## 2017-08-29 ENCOUNTER — Encounter (HOSPITAL_COMMUNITY): Payer: Self-pay

## 2017-08-29 ENCOUNTER — Telehealth (INDEPENDENT_AMBULATORY_CARE_PROVIDER_SITE_OTHER): Payer: Self-pay | Admitting: Orthopaedic Surgery

## 2017-08-29 ENCOUNTER — Encounter (HOSPITAL_COMMUNITY)
Admission: RE | Admit: 2017-08-29 | Discharge: 2017-08-29 | Disposition: A | Discharge status: Home / Self Care | Payer: Self-pay | Source: Ambulatory Visit | Attending: Orthopaedic Surgery | Admitting: Orthopaedic Surgery

## 2017-08-29 ENCOUNTER — Telehealth: Payer: Self-pay | Admitting: Internal Medicine

## 2017-08-29 DIAGNOSIS — E876 Hypokalemia: Secondary | ICD-10-CM

## 2017-08-29 DIAGNOSIS — Z6831 Body mass index (BMI) 31.0-31.9, adult: Secondary | ICD-10-CM | POA: Insufficient documentation

## 2017-08-29 DIAGNOSIS — Z7982 Long term (current) use of aspirin: Secondary | ICD-10-CM | POA: Insufficient documentation

## 2017-08-29 DIAGNOSIS — Z79899 Other long term (current) drug therapy: Secondary | ICD-10-CM | POA: Insufficient documentation

## 2017-08-29 DIAGNOSIS — E669 Obesity, unspecified: Secondary | ICD-10-CM | POA: Insufficient documentation

## 2017-08-29 DIAGNOSIS — Z01812 Encounter for preprocedural laboratory examination: Secondary | ICD-10-CM | POA: Insufficient documentation

## 2017-08-29 DIAGNOSIS — R7303 Prediabetes: Secondary | ICD-10-CM

## 2017-08-29 DIAGNOSIS — I10 Essential (primary) hypertension: Secondary | ICD-10-CM | POA: Insufficient documentation

## 2017-08-29 DIAGNOSIS — M4802 Spinal stenosis, cervical region: Secondary | ICD-10-CM | POA: Insufficient documentation

## 2017-08-29 LAB — CBC
HEMATOCRIT: 43.5 % (ref 36.0–46.0)
Hemoglobin: 14.5 g/dL (ref 12.0–15.0)
MCH: 28 pg (ref 26.0–34.0)
MCHC: 33.3 g/dL (ref 30.0–36.0)
MCV: 84.1 fL (ref 78.0–100.0)
Platelets: 407 10*3/uL — ABNORMAL HIGH (ref 150–400)
RBC: 5.17 MIL/uL — ABNORMAL HIGH (ref 3.87–5.11)
RDW: 12.7 % (ref 11.5–15.5)
WBC: 10.1 10*3/uL (ref 4.0–10.5)

## 2017-08-29 LAB — URINALYSIS, ROUTINE W REFLEX MICROSCOPIC
BILIRUBIN URINE: NEGATIVE
GLUCOSE, UA: NEGATIVE mg/dL
KETONES UR: NEGATIVE mg/dL
LEUKOCYTES UA: NEGATIVE
NITRITE: NEGATIVE
PROTEIN: NEGATIVE mg/dL
Specific Gravity, Urine: 1.012 (ref 1.005–1.030)
pH: 7 (ref 5.0–8.0)

## 2017-08-29 LAB — COMPREHENSIVE METABOLIC PANEL
ALBUMIN: 4.2 g/dL (ref 3.5–5.0)
ALT: 22 U/L (ref 14–54)
ANION GAP: 14 (ref 5–15)
AST: 22 U/L (ref 15–41)
Alkaline Phosphatase: 94 U/L (ref 38–126)
BILIRUBIN TOTAL: 0.5 mg/dL (ref 0.3–1.2)
BUN: 9 mg/dL (ref 6–20)
CO2: 27 mmol/L (ref 22–32)
Calcium: 9.6 mg/dL (ref 8.9–10.3)
Chloride: 98 mmol/L — ABNORMAL LOW (ref 101–111)
Creatinine, Ser: 0.75 mg/dL (ref 0.44–1.00)
GFR calc Af Amer: >60 mL/min (ref >60)
GFR calc non Af Amer: >60 mL/min (ref >60)
GLUCOSE: 162 mg/dL — AB (ref 65–99)
POTASSIUM: 2.8 mmol/L — AB (ref 3.5–5.1)
Sodium: 139 mmol/L (ref 135–145)
TOTAL PROTEIN: 7.6 g/dL (ref 6.5–8.1)

## 2017-08-29 LAB — SURGICAL PCR SCREEN
MRSA, PCR: NEGATIVE
Staphylococcus aureus: NEGATIVE

## 2017-08-29 NOTE — Telephone Encounter (Signed)
FYIAilene Ravel from [facility] called to inform that patients potassium was low with a reading at 2.8 and has informed the PCP. Patient is scheduled for C-spine Surgery 10th of June.

## 2017-08-29 NOTE — Telephone Encounter (Signed)
Charlene Allen from [facility] called to inform that patients potassium was low reading at 2.8 and wanted to inform PCP. Charlene Allen states patient has a surgery schedule on June the 10. Please follow up

## 2017-08-29 NOTE — Progress Notes (Addendum)
PCP - Karle Plumber Cardiologist - denies current cardiologist, had Cath done by Dr. Irish Lack in 2018  Chest x-ray - 07/22/17 EKG - 07/23/17 ECHO -  09/08/15 Cardiac Cath - 07/12/16  Aspirin Instructions: No instructions were given to patient. Message left for nurse at Dr. [name]' office to give patient a call. Pt also given instructions to call Dr. [name]' office regarding this.   Anesthesia review: EKG, pt denies having any recent chest pain, states that the only pain she has is coming from her neck.   Patient denies shortness of breath, fever, cough and chest pain at PAT appointment   Patient verbalized understanding of instructions that were given to them at the PAT appointment. Patient was also instructed that they will need to review over the PAT instructions again at home before surgery.

## 2017-08-29 NOTE — Telephone Encounter (Signed)
Please advise 

## 2017-08-29 NOTE — Progress Notes (Addendum)
Potassium 2.8. Called PCP at [facility] to address this. Message being relayed to [employer]. Notified Sherrie at Dr. [name]' office regarding this.

## 2017-08-30 ENCOUNTER — Ambulatory Visit (INDEPENDENT_AMBULATORY_CARE_PROVIDER_SITE_OTHER): Payer: Self-pay | Admitting: Orthopaedic Surgery

## 2017-08-30 MED ORDER — POTASSIUM CHLORIDE CRYS ER 20 MEQ PO TBCR: EXTENDED_RELEASE_TABLET | ORAL | 1 refills | Status: DC

## 2017-08-30 NOTE — Telephone Encounter (Signed)
PCP sent in KCL and she starts today. Getting recheck for increased glucose level on Thursday as well.

## 2017-08-30 NOTE — Telephone Encounter (Signed)
Phone call placed to patient last evening.  Patient informed of the low potassium level.  This is likely due to HCTZ.  She will need potassium supplement.  Prescription will be sent to her pharmacy.  I have asked her to return to the laboratory on 09/01/2017 for repeat potassium check.  I also note on her lab that blood sugar was slightly elevated.  She has a past history of prediabetes.  We will check an A1c when she comes to the lab in 2 days.  Patient expressed understanding of the plan.

## 2017-08-31 NOTE — Progress Notes (Signed)
Anesthesia Chart Review:   Case:  [id number] Date/Time:  09/05/17 1215   Procedure:  C4-5, C5-6 ANTERIOR CERVICAL DECOMPRESSION/DISCECTOMY FUSION, ALLOGRAFT, PLATE (N/A )   Anesthesia type:  General   Pre-op diagnosis:  C4-5, C5-6 SEVERE STENOSIS   Location:  [facility]OM 05 / [facility]   Surgeon:  Marybelle Killings, MD      DISCUSSION: - Pt is a 62 year old female with hx HTN  - K 2.8.  Pt started on potassium supplement.  Will recheck K day of surgery   VS: BP (!) 145/66   Pulse 67   Temp 36.8 C   Resp 20   Ht 5\' 3"  (1.6 m)   Wt 179 lb 4.8 oz (81.3 kg)   SpO2 97%   BMI 31.76 kg/m   PROVIDERS: Ladell Pier, MD   LABS:  - K 2.8.  PCP notified, pt started on potassium supplement.  Will recheck K day of surgery  (all labs ordered are listed, but only abnormal results are displayed)  Labs Reviewed  CBC - Abnormal; Notable for the following components:      Result Value   RBC 5.17 (*)    Platelets 407 (*)    All other components within normal limits  COMPREHENSIVE METABOLIC PANEL - Abnormal; Notable for the following components:   Potassium 2.8 (*)    Chloride 98 (*)    Glucose, Bld 162 (*)    All other components within normal limits  URINALYSIS, ROUTINE W REFLEX MICROSCOPIC - Abnormal; Notable for the following components:   Hgb urine dipstick SMALL (*)    Bacteria, UA RARE (*)    All other components within normal limits  SURGICAL PCR SCREEN     IMAGES:  CXR 07/22/17: No edema or consolidation.   EKG 07/22/17: NSR. Possible Anterior infarct, age undetermined   CV:  Cardiac cath, abdominal aortogram 07/12/16:   Minimal nonobstructive coronary artery disease.  The left ventricular systolic function is normal.  LV end diastolic pressure is normal.  The left ventricular ejection fraction is 55-65% by visual estimate.  There is no aortic valve stenosis.  No abdominal aortic aneurysm. No renal artery stenosis.  Echo 09/08/15:  - Left ventricle: The cavity  size was normal. There was mild concentric hypertrophy. Systolic function was normal. The estimated ejection fraction was in the range of 55% to 60%. Wall motion was normal; there were no regional wall motion abnormalities. There was an increased relative contribution of atrial contraction to ventricular filling. Doppler parameters are consistent with abnormal left ventricular relaxation (grade 1 diastolic dysfunction). Indeterminate mean left atrial filling pressure. - Left atrium: The atrium was mildly dilated. - Atrial septum: No defect or patent foramen ovale was identified.   Past Medical History:  Diagnosis Date  . Hypertension Dx 2015  . Obesity   . PMB (postmenopausal bleeding)     Past Surgical History:  Procedure Laterality Date  . ABDOMINAL AORTOGRAM N/A 07/12/2016   Procedure: Abdominal Aortogram;  Surgeon: Jettie Booze, MD;  Location: [facility];  Service: Cardiovascular;  Laterality: N/A;  . HAND SURGERY    . LEFT HEART CATH AND CORONARY ANGIOGRAPHY N/A 07/12/2016   Procedure: Left Heart Cath and Coronary Angiography;  Surgeon: Jettie Booze, MD;  Location: [facility];  Service: Cardiovascular;  Laterality: N/A;  . TUBAL LIGATION      MEDICATIONS: . acetaminophen-codeine (TYLENOL #4) 300-60 MG tablet  . albuterol (PROVENTIL HFA;VENTOLIN HFA) 108 (  90 Base) MCG/ACT inhaler  . amLODipine (NORVASC) 10 MG tablet  . aspirin 81 MG tablet  . atorvastatin (LIPITOR) 20 MG tablet  . budesonide-formoterol (SYMBICORT) 80-4.5 MCG/ACT inhaler  . clonazePAM (KLONOPIN) 0.5 MG tablet  . diphenhydrAMINE (BENADRYL) 25 MG tablet  . fluticasone (FLONASE) 50 MCG/ACT nasal spray  . hydrochlorothiazide (HYDRODIURIL) 25 MG tablet  . methocarbamol (ROBAXIN) 500 MG tablet  . naproxen sodium (ALEVE) 220 MG tablet  . nicotine (NICODERM CQ - DOSED IN MG/24 HOURS) 14 mg/24hr patch  . nitroGLYCERIN (NITROSTAT) 0.4 MG SL tablet  . potassium chloride SA (K-DUR,KLOR-CON) 20  MEQ tablet  . pregabalin (LYRICA) 75 MG capsule   No current facility-administered medications for this encounter.     If labs acceptable day of surgery, I anticipate pt can proceed with surgery as scheduled.   Willeen Cass, FNP-BC [facility]/Anesthesiology Phone: [phone] 08/31/2017 11:45 AM

## 2017-09-01 ENCOUNTER — Ambulatory Visit: Payer: Self-pay | Attending: Internal Medicine

## 2017-09-01 DIAGNOSIS — R7303 Prediabetes: Secondary | ICD-10-CM | POA: Insufficient documentation

## 2017-09-01 DIAGNOSIS — E876 Hypokalemia: Secondary | ICD-10-CM | POA: Insufficient documentation

## 2017-09-01 NOTE — Progress Notes (Signed)
Patient here for lab visit only 

## 2017-09-02 ENCOUNTER — Other Ambulatory Visit: Payer: Self-pay | Admitting: Internal Medicine

## 2017-09-02 ENCOUNTER — Telehealth: Payer: Self-pay | Admitting: Internal Medicine

## 2017-09-02 LAB — HEMOGLOBIN A1C
ESTIMATED AVERAGE GLUCOSE: 140 mg/dL
HEMOGLOBIN A1C: 6.5 % — AB (ref 4.8–5.6)

## 2017-09-02 LAB — POTASSIUM: Potassium: 3.6 mmol/L (ref 3.5–5.2)

## 2017-09-02 MED ORDER — METFORMIN HCL 500 MG PO TABS: 250.0000 mg | ORAL_TABLET | Freq: Every day | ORAL | 3 refills | Status: DC

## 2017-09-02 NOTE — Telephone Encounter (Signed)
Patient called for results and I sent her to [facility]

## 2017-09-05 ENCOUNTER — Observation Stay (HOSPITAL_COMMUNITY)
Admission: RE | Admit: 2017-09-05 | Discharge: 2017-09-06 | Disposition: A | Discharge status: Home / Self Care | Payer: Medicaid Other | Source: Ambulatory Visit | Attending: Orthopaedic Surgery | Admitting: Orthopaedic Surgery

## 2017-09-05 ENCOUNTER — Ambulatory Visit (HOSPITAL_COMMUNITY): Payer: Medicaid Other | Admitting: Emergency Medicine

## 2017-09-05 ENCOUNTER — Ambulatory Visit (HOSPITAL_COMMUNITY): Payer: Medicaid Other

## 2017-09-05 ENCOUNTER — Encounter (HOSPITAL_COMMUNITY)
Admission: RE | Disposition: A | Discharge status: Home / Self Care | Payer: Self-pay | Source: Ambulatory Visit | Attending: Orthopaedic Surgery

## 2017-09-05 ENCOUNTER — Encounter (HOSPITAL_COMMUNITY): Payer: Self-pay | Admitting: *Deleted

## 2017-09-05 DIAGNOSIS — J449 Chronic obstructive pulmonary disease, unspecified: Secondary | ICD-10-CM | POA: Insufficient documentation

## 2017-09-05 DIAGNOSIS — Z79899 Other long term (current) drug therapy: Secondary | ICD-10-CM | POA: Diagnosis not present

## 2017-09-05 DIAGNOSIS — M4802 Spinal stenosis, cervical region: Secondary | ICD-10-CM | POA: Insufficient documentation

## 2017-09-05 DIAGNOSIS — I10 Essential (primary) hypertension: Secondary | ICD-10-CM | POA: Insufficient documentation

## 2017-09-05 DIAGNOSIS — Z888 Allergy status to other drugs, medicaments and biological substances status: Secondary | ICD-10-CM | POA: Insufficient documentation

## 2017-09-05 DIAGNOSIS — E669 Obesity, unspecified: Secondary | ICD-10-CM | POA: Insufficient documentation

## 2017-09-05 DIAGNOSIS — M4722 Other spondylosis with radiculopathy, cervical region: Principal | ICD-10-CM | POA: Insufficient documentation

## 2017-09-05 DIAGNOSIS — Z419 Encounter for procedure for purposes other than remedying health state, unspecified: Secondary | ICD-10-CM

## 2017-09-05 DIAGNOSIS — N950 Postmenopausal bleeding: Secondary | ICD-10-CM | POA: Insufficient documentation

## 2017-09-05 DIAGNOSIS — E119 Type 2 diabetes mellitus without complications: Secondary | ICD-10-CM | POA: Diagnosis not present

## 2017-09-05 DIAGNOSIS — Z7982 Long term (current) use of aspirin: Secondary | ICD-10-CM | POA: Diagnosis not present

## 2017-09-05 DIAGNOSIS — F17210 Nicotine dependence, cigarettes, uncomplicated: Secondary | ICD-10-CM | POA: Insufficient documentation

## 2017-09-05 HISTORY — PX: ANTERIOR CERVICAL DECOMP/DISCECTOMY FUSION: SHX1161

## 2017-09-05 HISTORY — DX: Type 2 diabetes mellitus without complications: E11.9

## 2017-09-05 LAB — POCT I-STAT 4, (NA,K, GLUC, HGB,HCT)
Glucose, Bld: 120 mg/dL — ABNORMAL HIGH (ref 65–99)
HEMATOCRIT: 39 % (ref 36.0–46.0)
Hemoglobin: 13.3 g/dL (ref 12.0–15.0)
POTASSIUM: 3.1 mmol/L — AB (ref 3.5–5.1)
SODIUM: 141 mmol/L (ref 135–145)

## 2017-09-05 LAB — GLUCOSE, CAPILLARY
GLUCOSE-CAPILLARY: 171 mg/dL — AB (ref 65–99)
Glucose-Capillary: 267 mg/dL — ABNORMAL HIGH (ref 65–99)

## 2017-09-05 SURGERY — ANTERIOR CERVICAL DECOMPRESSION/DISCECTOMY FUSION 2 LEVELS
Anesthesia: General | Site: Neck

## 2017-09-05 MED ORDER — OXYCODONE HCL 5 MG PO TABS
5.0000 mg | ORAL_TABLET | ORAL | Status: DC | PRN
  Administered 2017-09-05 – 2017-09-06 (×4): 5 mg via ORAL
  Filled 2017-09-05 (×3): qty 1

## 2017-09-05 MED ORDER — SODIUM CHLORIDE 0.9% FLUSH: 3.0000 mL | INTRAVENOUS | Status: DC | PRN

## 2017-09-05 MED ORDER — DEXAMETHASONE SODIUM PHOSPHATE 10 MG/ML IJ SOLN
INTRAMUSCULAR | Status: AC
  Filled 2017-09-05: qty 1

## 2017-09-05 MED ORDER — ONDANSETRON HCL 4 MG/2ML IJ SOLN: 4.0000 mg | Freq: Four times a day (QID) | INTRAMUSCULAR | Status: DC | PRN

## 2017-09-05 MED ORDER — CHLORHEXIDINE GLUCONATE 4 % EX LIQD: 60.0000 mL | Freq: Once | CUTANEOUS | Status: DC

## 2017-09-05 MED ORDER — BUPIVACAINE-EPINEPHRINE (PF) 0.25% -1:200000 IJ SOLN
INTRAMUSCULAR | Status: AC
  Filled 2017-09-05: qty 30

## 2017-09-05 MED ORDER — FENTANYL CITRATE (PF) 250 MCG/5ML IJ SOLN
INTRAMUSCULAR | Status: AC
  Filled 2017-09-05: qty 5

## 2017-09-05 MED ORDER — ROCURONIUM BROMIDE 10 MG/ML (PF) SYRINGE
PREFILLED_SYRINGE | INTRAVENOUS | Status: AC
  Filled 2017-09-05: qty 5

## 2017-09-05 MED ORDER — SODIUM CHLORIDE 0.9 % IV SOLN
INTRAVENOUS | Status: DC
  Administered 2017-09-05: 18:00:00 via INTRAVENOUS

## 2017-09-05 MED ORDER — 0.9 % SODIUM CHLORIDE (POUR BTL) OPTIME
TOPICAL | Status: DC | PRN
  Administered 2017-09-05: 1000 mL

## 2017-09-05 MED ORDER — ROCURONIUM BROMIDE 100 MG/10ML IV SOLN
INTRAVENOUS | Status: DC | PRN
  Administered 2017-09-05: 10 mg via INTRAVENOUS
  Administered 2017-09-05: 50 mg via INTRAVENOUS

## 2017-09-05 MED ORDER — CEFAZOLIN SODIUM-DEXTROSE 2-4 GM/100ML-% IV SOLN
2.0000 g | INTRAVENOUS | Status: AC
  Administered 2017-09-05: 2 g via INTRAVENOUS
  Filled 2017-09-05: qty 100

## 2017-09-05 MED ORDER — PHENOL 1.4 % MT LIQD: 1.0000 | OROMUCOSAL | Status: DC | PRN

## 2017-09-05 MED ORDER — OXYCODONE-ACETAMINOPHEN 5-325 MG PO TABS: 1.0000 | ORAL_TABLET | Freq: Four times a day (QID) | ORAL | 0 refills | Status: DC | PRN

## 2017-09-05 MED ORDER — LACTATED RINGERS IV SOLN
INTRAVENOUS | Status: DC
  Administered 2017-09-05 (×2): via INTRAVENOUS

## 2017-09-05 MED ORDER — HYDROCHLOROTHIAZIDE 25 MG PO TABS: 25.0000 mg | ORAL_TABLET | Freq: Every day | ORAL | Status: DC

## 2017-09-05 MED ORDER — THROMBIN 5000 UNITS EX SOLR
CUTANEOUS | Status: AC
  Filled 2017-09-05: qty 5000

## 2017-09-05 MED ORDER — MIDAZOLAM HCL 2 MG/2ML IJ SOLN
INTRAMUSCULAR | Status: AC
  Filled 2017-09-05: qty 2

## 2017-09-05 MED ORDER — SUGAMMADEX SODIUM 200 MG/2ML IV SOLN
INTRAVENOUS | Status: DC | PRN
  Administered 2017-09-05: 200 mg via INTRAVENOUS

## 2017-09-05 MED ORDER — MIDAZOLAM HCL 5 MG/5ML IJ SOLN
INTRAMUSCULAR | Status: DC | PRN
  Administered 2017-09-05: 2 mg via INTRAVENOUS

## 2017-09-05 MED ORDER — PREGABALIN 75 MG PO CAPS: 75.0000 mg | ORAL_CAPSULE | Freq: Every day | ORAL | Status: DC

## 2017-09-05 MED ORDER — OXYCODONE HCL 5 MG/5ML PO SOLN: 5.0000 mg | Freq: Once | ORAL | Status: DC | PRN

## 2017-09-05 MED ORDER — FENTANYL CITRATE (PF) 100 MCG/2ML IJ SOLN
25.0000 ug | INTRAMUSCULAR | Status: DC | PRN
  Administered 2017-09-05 (×2): 50 ug via INTRAVENOUS

## 2017-09-05 MED ORDER — POLYETHYLENE GLYCOL 3350 17 G PO PACK: 17.0000 g | PACK | Freq: Every day | ORAL | Status: DC

## 2017-09-05 MED ORDER — BUPIVACAINE-EPINEPHRINE 0.25% -1:200000 IJ SOLN
INTRAMUSCULAR | Status: DC | PRN
  Administered 2017-09-05: 6 mL
  Administered 2017-09-05: 30 mL

## 2017-09-05 MED ORDER — ONDANSETRON HCL 4 MG/2ML IJ SOLN
INTRAMUSCULAR | Status: DC | PRN
  Administered 2017-09-05: 4 mg via INTRAVENOUS

## 2017-09-05 MED ORDER — HYDROMORPHONE HCL 1 MG/ML IJ SOLN
INTRAMUSCULAR | Status: AC
  Filled 2017-09-05: qty 0.5

## 2017-09-05 MED ORDER — ATORVASTATIN CALCIUM 20 MG PO TABS
20.0000 mg | ORAL_TABLET | Freq: Every day | ORAL | Status: DC
  Administered 2017-09-05: 20 mg via ORAL
  Filled 2017-09-05: qty 1

## 2017-09-05 MED ORDER — SODIUM CHLORIDE 0.9 % IV SOLN: 250.0000 mL | INTRAVENOUS | Status: DC

## 2017-09-05 MED ORDER — METHOCARBAMOL 1000 MG/10ML IJ SOLN
500.0000 mg | Freq: Four times a day (QID) | INTRAVENOUS | Status: DC | PRN
  Filled 2017-09-05: qty 5

## 2017-09-05 MED ORDER — LIDOCAINE HCL (CARDIAC) PF 100 MG/5ML IV SOSY
PREFILLED_SYRINGE | INTRAVENOUS | Status: DC | PRN
  Administered 2017-09-05: 50 mg via INTRAVENOUS

## 2017-09-05 MED ORDER — AMLODIPINE BESYLATE 5 MG PO TABS: 10.0000 mg | ORAL_TABLET | Freq: Every day | ORAL | Status: DC

## 2017-09-05 MED ORDER — METHOCARBAMOL 500 MG PO TABS: 500.0000 mg | ORAL_TABLET | Freq: Four times a day (QID) | ORAL | 0 refills | Status: DC | PRN

## 2017-09-05 MED ORDER — NITROGLYCERIN 0.4 MG SL SUBL: 0.4000 mg | SUBLINGUAL_TABLET | SUBLINGUAL | Status: DC | PRN

## 2017-09-05 MED ORDER — THROMBIN 5000 UNITS EX SOLR: CUTANEOUS | Status: DC | PRN

## 2017-09-05 MED ORDER — DOCUSATE SODIUM 100 MG PO CAPS
100.0000 mg | ORAL_CAPSULE | Freq: Two times a day (BID) | ORAL | Status: DC
  Administered 2017-09-05 – 2017-09-06 (×2): 100 mg via ORAL
  Filled 2017-09-05: qty 1

## 2017-09-05 MED ORDER — ONDANSETRON HCL 4 MG PO TABS: 4.0000 mg | ORAL_TABLET | Freq: Four times a day (QID) | ORAL | Status: DC | PRN

## 2017-09-05 MED ORDER — CEFAZOLIN SODIUM-DEXTROSE 1-4 GM/50ML-% IV SOLN
1.0000 g | Freq: Three times a day (TID) | INTRAVENOUS | Status: AC
  Administered 2017-09-05 – 2017-09-06 (×2): 1 g via INTRAVENOUS
  Filled 2017-09-05 (×2): qty 50

## 2017-09-05 MED ORDER — OXYCODONE HCL 5 MG PO TABS: 5.0000 mg | ORAL_TABLET | Freq: Once | ORAL | Status: DC | PRN

## 2017-09-05 MED ORDER — PROPOFOL 10 MG/ML IV BOLUS
INTRAVENOUS | Status: AC
  Filled 2017-09-05: qty 20

## 2017-09-05 MED ORDER — FLUTICASONE PROPIONATE 50 MCG/ACT NA SUSP: 1.0000 | Freq: Every day | NASAL | Status: DC | PRN

## 2017-09-05 MED ORDER — ONDANSETRON HCL 4 MG/2ML IJ SOLN: 4.0000 mg | Freq: Once | INTRAMUSCULAR | Status: DC | PRN

## 2017-09-05 MED ORDER — FENTANYL CITRATE (PF) 100 MCG/2ML IJ SOLN
INTRAMUSCULAR | Status: AC
  Filled 2017-09-05: qty 2

## 2017-09-05 MED ORDER — HEPARIN SODIUM (PORCINE) 1000 UNIT/ML IJ SOLN
INTRAMUSCULAR | Status: AC
  Filled 2017-09-05: qty 1

## 2017-09-05 MED ORDER — POTASSIUM CHLORIDE CRYS ER 20 MEQ PO TBCR: 20.0000 meq | EXTENDED_RELEASE_TABLET | Freq: Every day | ORAL | Status: DC

## 2017-09-05 MED ORDER — FENTANYL CITRATE (PF) 100 MCG/2ML IJ SOLN
INTRAMUSCULAR | Status: DC | PRN
  Administered 2017-09-05 (×2): 100 ug via INTRAVENOUS
  Administered 2017-09-05 (×4): 50 ug via INTRAVENOUS
  Administered 2017-09-05: 100 ug via INTRAVENOUS

## 2017-09-05 MED ORDER — LIDOCAINE 2% (20 MG/ML) 5 ML SYRINGE
INTRAMUSCULAR | Status: AC
  Filled 2017-09-05: qty 5

## 2017-09-05 MED ORDER — ACETAMINOPHEN 650 MG RE SUPP: 650.0000 mg | RECTAL | Status: DC | PRN

## 2017-09-05 MED ORDER — THROMBIN 5000 UNITS EX SOLR
CUTANEOUS | Status: DC | PRN
  Administered 2017-09-05: 5000 [IU] via TOPICAL

## 2017-09-05 MED ORDER — DEXMEDETOMIDINE HCL 200 MCG/2ML IV SOLN
INTRAVENOUS | Status: DC | PRN
  Administered 2017-09-05: 8 ug via INTRAVENOUS
  Administered 2017-09-05: 12 ug via INTRAVENOUS

## 2017-09-05 MED ORDER — ONDANSETRON HCL 4 MG/2ML IJ SOLN
INTRAMUSCULAR | Status: AC
  Filled 2017-09-05: qty 2

## 2017-09-05 MED ORDER — DEXAMETHASONE SODIUM PHOSPHATE 10 MG/ML IJ SOLN
INTRAMUSCULAR | Status: DC | PRN
  Administered 2017-09-05: 10 mg via INTRAVENOUS

## 2017-09-05 MED ORDER — HYDROMORPHONE HCL 1 MG/ML IJ SOLN
INTRAMUSCULAR | Status: DC | PRN
  Administered 2017-09-05: 0.5 mg via INTRAVENOUS

## 2017-09-05 MED ORDER — HYDROMORPHONE HCL 1 MG/ML IJ SOLN
0.5000 mg | INTRAMUSCULAR | Status: DC | PRN
  Administered 2017-09-05 (×2): 0.5 mg via INTRAVENOUS
  Filled 2017-09-05 (×2): qty 0.5

## 2017-09-05 MED ORDER — ALBUTEROL SULFATE (2.5 MG/3ML) 0.083% IN NEBU: 3.0000 mL | INHALATION_SOLUTION | Freq: Four times a day (QID) | RESPIRATORY_TRACT | Status: DC | PRN

## 2017-09-05 MED ORDER — PROPOFOL 10 MG/ML IV BOLUS
INTRAVENOUS | Status: DC | PRN
  Administered 2017-09-05: 150 mg via INTRAVENOUS
  Administered 2017-09-05: 50 mg via INTRAVENOUS

## 2017-09-05 MED ORDER — METHOCARBAMOL 500 MG PO TABS
500.0000 mg | ORAL_TABLET | Freq: Four times a day (QID) | ORAL | Status: DC | PRN
  Administered 2017-09-05 – 2017-09-06 (×2): 500 mg via ORAL
  Filled 2017-09-05 (×2): qty 1

## 2017-09-05 MED ORDER — METFORMIN HCL 500 MG PO TABS
250.0000 mg | ORAL_TABLET | Freq: Every day | ORAL | Status: DC
  Administered 2017-09-06: 250 mg via ORAL
  Filled 2017-09-05: qty 1

## 2017-09-05 MED ORDER — ACETAMINOPHEN 325 MG PO TABS
650.0000 mg | ORAL_TABLET | ORAL | Status: DC | PRN
  Administered 2017-09-06: 650 mg via ORAL
  Filled 2017-09-05: qty 2

## 2017-09-05 MED ORDER — NICOTINE 14 MG/24HR TD PT24
14.0000 mg | MEDICATED_PATCH | Freq: Every day | TRANSDERMAL | Status: DC
  Administered 2017-09-05: 14 mg via TRANSDERMAL
  Filled 2017-09-05: qty 1

## 2017-09-05 MED ORDER — SUGAMMADEX SODIUM 200 MG/2ML IV SOLN
INTRAVENOUS | Status: AC
  Filled 2017-09-05: qty 2

## 2017-09-05 MED ORDER — INSULIN ASPART 100 UNIT/ML ~~LOC~~ SOLN
0.0000 [IU] | Freq: Three times a day (TID) | SUBCUTANEOUS | Status: DC
  Administered 2017-09-06: 3 [IU] via SUBCUTANEOUS

## 2017-09-05 MED ORDER — MENTHOL 3 MG MT LOZG: 1.0000 | LOZENGE | OROMUCOSAL | Status: DC | PRN

## 2017-09-05 MED ORDER — SODIUM CHLORIDE 0.9% FLUSH: 3.0000 mL | Freq: Two times a day (BID) | INTRAVENOUS | Status: DC

## 2017-09-05 SURGICAL SUPPLY — 62 items
APL SKNCLS STERI-STRIP NONHPOA (GAUZE/BANDAGES/DRESSINGS) ×1
BENZOIN TINCTURE PRP APPL 2/3 (GAUZE/BANDAGES/DRESSINGS) ×3 IMPLANT
BIT DRILL SKYLINE 12MM (BIT) IMPLANT
BLADE CLIPPER SURG (BLADE) IMPLANT
BONE CERV LORDOTIC 14.5X12X7 (Bone Implant) ×3 IMPLANT
BONE CERV LORDOTIC 14.5X12X8 (Bone Implant) ×3 IMPLANT
BUR ROUND FLUTED 4 SOFT TCH (BURR) IMPLANT
BUR ROUND FLUTED 4MM SOFT TCH (BURR)
CLOSURE STERI-STRIP 1/2X4 (GAUZE/BANDAGES/DRESSINGS) ×1
CLOSURE WOUND 1/2 X4 (GAUZE/BANDAGES/DRESSINGS) ×1
CLSR STERI-STRIP ANTIMIC 1/2X4 (GAUZE/BANDAGES/DRESSINGS) ×1 IMPLANT
COLLAR CERV LO CONTOUR FIRM DE (SOFTGOODS) IMPLANT
CORD BIPOLAR FORCEPS 12FT (ELECTRODE) ×3 IMPLANT
COVER SURGICAL LIGHT HANDLE (MISCELLANEOUS) ×3 IMPLANT
CRADLE DONUT ADULT HEAD (MISCELLANEOUS) ×3 IMPLANT
DRAPE C-ARM 42X72 X-RAY (DRAPES) ×3 IMPLANT
DRAPE HALF SHEET 40X57 (DRAPES) ×3 IMPLANT
DRAPE MICROSCOPE LEICA (MISCELLANEOUS) ×3 IMPLANT
DRILL BIT SKYLINE 12MM (BIT) ×3
DURAPREP 6ML APPLICATOR 50/CS (WOUND CARE) ×3 IMPLANT
DURASEAL SPINE SEALANT 3ML (MISCELLANEOUS) ×2 IMPLANT
ELECT COATED BLADE 2.86 ST (ELECTRODE) ×3 IMPLANT
ELECT REM PT RETURN 9FT ADLT (ELECTROSURGICAL) ×3
ELECTRODE REM PT RTRN 9FT ADLT (ELECTROSURGICAL) ×1 IMPLANT
EVACUATOR 1/8 PVC DRAIN (DRAIN) ×3 IMPLANT
GAUZE SPONGE 4X4 12PLY STRL (GAUZE/BANDAGES/DRESSINGS) ×3 IMPLANT
GLOVE BIOGEL PI IND STRL 8 (GLOVE) ×2 IMPLANT
GLOVE BIOGEL PI INDICATOR 8 (GLOVE) ×4
GLOVE ORTHO TXT STRL SZ7.5 (GLOVE) ×6 IMPLANT
GOWN STRL REUS W/ TWL LRG LVL3 (GOWN DISPOSABLE) ×1 IMPLANT
GOWN STRL REUS W/ TWL XL LVL3 (GOWN DISPOSABLE) ×1 IMPLANT
GOWN STRL REUS W/TWL 2XL LVL3 (GOWN DISPOSABLE) ×3 IMPLANT
GOWN STRL REUS W/TWL LRG LVL3 (GOWN DISPOSABLE) ×3
GOWN STRL REUS W/TWL XL LVL3 (GOWN DISPOSABLE) ×3
GRAFT BNE SPCR VG2 14.5X12X7 (Bone Implant) IMPLANT
GRAFT BNE SPCR VG2 14.5X12X8 (Bone Implant) IMPLANT
HEAD HALTER (SOFTGOODS) ×3 IMPLANT
HEMOSTAT SURGICEL 2X14 (HEMOSTASIS) IMPLANT
KIT BASIN OR (CUSTOM PROCEDURE TRAY) ×3 IMPLANT
KIT TURNOVER KIT B (KITS) ×3 IMPLANT
MANIFOLD NEPTUNE II (INSTRUMENTS) IMPLANT
MATRIX HEMOSTAT SURGIFLO (HEMOSTASIS) ×4 IMPLANT
NDL 25GX 5/8IN NON SAFETY (NEEDLE) ×1 IMPLANT
NEEDLE 25GX 5/8IN NON SAFETY (NEEDLE) ×3 IMPLANT
NS IRRIG 1000ML POUR BTL (IV SOLUTION) ×3 IMPLANT
PACK ORTHO CERVICAL (CUSTOM PROCEDURE TRAY) ×3 IMPLANT
PAD ARMBOARD 7.5X6 YLW CONV (MISCELLANEOUS) ×6 IMPLANT
PATTIES SURGICAL .5 X.5 (GAUZE/BANDAGES/DRESSINGS) IMPLANT
PIN TEMP SKYLINE THREADED (PIN) ×2 IMPLANT
PLATE SKYLINE TWO LEVEL 28MM (Plate) ×2 IMPLANT
RESTRAINT LIMB HOLDER UNIV (RESTRAINTS) IMPLANT
SCREW VARIABLE SELF TAP 12MM (Screw) ×16 IMPLANT
STRIP CLOSURE SKIN 1/2X4 (GAUZE/BANDAGES/DRESSINGS) ×2 IMPLANT
SURGIFLO W/THROMBIN 8M KIT (HEMOSTASIS) IMPLANT
SUT BONE WAX W31G (SUTURE) ×3 IMPLANT
SUT VIC AB 3-0 X1 27 (SUTURE) ×3 IMPLANT
SUT VICRYL 4-0 PS2 18IN ABS (SUTURE) ×6 IMPLANT
SYR CONTROL 10ML LL (SYRINGE) ×2 IMPLANT
TAPE CLOTH SURG 4X10 WHT LF (GAUZE/BANDAGES/DRESSINGS) ×2 IMPLANT
TOWEL OR 17X24 6PK STRL BLUE (TOWEL DISPOSABLE) ×3 IMPLANT
TOWEL OR 17X26 10 PK STRL BLUE (TOWEL DISPOSABLE) ×3 IMPLANT
TRAY FOLEY CATH SILVER 16FR (SET/KITS/TRAYS/PACK) IMPLANT

## 2017-09-05 NOTE — Anesthesia Procedure Notes (Signed)
Procedure Name: Intubation Date/Time: 09/05/2017 1:13 PM Performed by: Babs Bertin, CRNA Pre-anesthesia Checklist: Patient identified, Emergency Drugs available, Suction available and Patient being monitored Patient Re-evaluated:Patient Re-evaluated prior to induction Oxygen Delivery Method: Circle System Utilized Preoxygenation: Pre-oxygenation with 100% oxygen Induction Type: IV induction Ventilation: Mask ventilation without difficulty Laryngoscope Size: Mac and 3 Grade View: Grade I Tube type: Oral Tube size: 7.0 mm Number of attempts: 1 Airway Equipment and Method: Stylet and Oral airway Placement Confirmation: ETT inserted through vocal cords under direct vision,  positive ETCO2 and breath sounds checked- equal and bilateral Secured at: 21 cm Tube secured with: Tape Dental Injury: Teeth and Oropharynx as per pre-operative assessment

## 2017-09-05 NOTE — Progress Notes (Signed)
Dr. [name] made aware that patient did not stop aspirin (81 mg)- he stated was okay.

## 2017-09-05 NOTE — Anesthesia Preprocedure Evaluation (Addendum)
Anesthesia Evaluation  Patient identified by MRN, date of birth, ID band Patient awake    Reviewed: Allergy & Precautions, NPO status , Patient's Chart, lab work & pertinent test results  Airway Mallampati: II  TM Distance: >3 FB Neck ROM: Full    Dental  (+) Partial Upper, Dental Advisory Given, Teeth Intact   Pulmonary Current Smoker,    breath sounds clear to auscultation       Cardiovascular hypertension,  Rhythm:Regular Rate:Normal     Neuro/Psych    GI/Hepatic   Endo/Other  diabetes  Renal/GU      Musculoskeletal   Abdominal   Peds  Hematology   Anesthesia Other Findings   Reproductive/Obstetrics                            Anesthesia Physical Anesthesia Plan  ASA: III  Anesthesia Plan: General   Post-op Pain Management:    Induction:   PONV Risk Score and Plan: Ondansetron and Dexamethasone  Airway Management Planned: Oral ETT  Additional Equipment:   Intra-op Plan:   Post-operative Plan: Extubation in OR  Informed Consent: I have reviewed the patients History and Physical, chart, labs and discussed the procedure including the risks, benefits and alternatives for the proposed anesthesia with the patient or authorized representative who has indicated his/her understanding and acceptance.   Dental advisory given  Plan Discussed with: CRNA and Anesthesiologist  Anesthesia Plan Comments:         Anesthesia Quick Evaluation

## 2017-09-05 NOTE — Transfer of Care (Signed)
Immediate Anesthesia Transfer of Care Note  Patient: Charlene Allen  Procedure(s) Performed: C4-5, C5-6 ANTERIOR CERVICAL DECOMPRESSION/DISCECTOMY FUSION, ALLOGRAFT, PLATE (N/A Neck)  Patient Location: PACU  Anesthesia Type:General  Level of Consciousness: awake, alert  and oriented  Airway & Oxygen Therapy: Patient Spontanous Breathing  Post-op Assessment: Report given to RN and Post -op Vital signs reviewed and stable  Post vital signs: Reviewed and stable  Last Vitals:  Vitals Value Taken Time  BP 137/89 09/05/2017  4:23 PM  Temp    Pulse 93 09/05/2017  4:25 PM  Resp 10 09/05/2017  4:25 PM  SpO2 94 % 09/05/2017  4:25 PM  Vitals shown include unvalidated device data.  Last Pain:  Vitals:   09/05/17 1053  TempSrc:   PainSc: 6       Patients Stated Pain Goal: 3 (40/98/11 9147)  Complications: No apparent anesthesia complications

## 2017-09-05 NOTE — Op Note (Signed)
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  Postop diagnosis: Same  Procedure: C4-5, C5-6 anterior cervical discectomy and fusion, allograft and plate.  Surgeon: Rodell Perna, MD  Assistant: Benjiman Core, PA-C medically necessary and present for the entire procedure.  Anesthesia: General +6 cc Marcaine skin local  EBL: 450 cc  Has cervical spondylosis with severe stenosis at C4-5 and C5-6 with severe canal stenosis on the right side of the cord at C5-6 symptomatic with radicular symptoms.  She had failed conservative treatment.  Procedure: After induction general anesthesia orotracheal intubation arms tucked at the side with yellow pads wrist restraints applied for traction as needed during fluoroscopic imaging intermittently neck was prepped with DuraPrep there is grade with towel sterile skin marker Betadine Steri-Drape sterile female standard the head and thyroid sheets and drapes were applied.  Timeout procedure was completed Ancef was given prophylactically MRI images and plain radiographs were posted on the image screen.  Incision was made based on palpable landmarks of midline extending to the left platysma was divided in line with the fibers.  Blunt dissection above the omohyoid down the longus Coley with prominent spur at C4-5 with short 25 needle placed in the disc space which was difficult to find due to calcification overhanging spurs.  Straight clamp was applied and a crosstable C arm that is been sterilely draped confirmed that this was the C4-5 operative level.  Self-retaining Cloward retractors were placed 2 plates right left with plate cephalad caudad.  Anterior spurs were removed with combination of the bur and they are unsure.  Operative microscope was draped and brought in.  There was significant disc space narrowing and collapse with extrusion of most of the disc material posteriorly.  Initially the disc space was about 3 to 4 mm and had to continue to be  enlarged in order to fit a 7 mm graft to give enough visualization to get around the spur from the C4 vertebral body that was overhanging posteriorly across the disc space.  Behind the calcified spur was large amount extruded disc material and hypertrophic ligamentum.  It was meticulously removed decompressing the dura uncovertebral joints were stripped.  Using operative microscope and a 2 mm Kerrison rongeurs were used to remove the spurs.  Trial sizer showed a 7 mm graft gave good fit.  Is marked the midline with a purple marker head halter traction was pulled by the CRNA the graft was countersunk 1 to 2 mm and was tight secure.  Identical procedure was repeated at the C5-6 level.  There is extreme compression on the right side of the cord from large spurs and thinning of the dura.  Smallest Carling curettes were used to gradually thin down the spurs as well as 4 mm bur and using 59mm Kerrison portion of the spurs were removed.  There is thinning of the dura 98mm durotomy occurred.  The remaining portion of the spur was used to help decompress the dura to remove the cord compression.  There was extruded disc fragments on the left side and on the right side the disc material had calcified and was adherent to the dura.  Continued enlargement of the gap so that an 8 mm graft would fit was performed in order to get around the cephalad portion of the spur on C5 on the right side.  Once the spur was finally rounded he was able be gently peeled off the dura.  CSF leak at that point was not visualized there was a  small epidural vein was present but not bleeding.  Some DuraSeal was mixed in light coating placed on the right side and therefore initially there was concern for CSF leak.  Care was taken to make sure that the layer was thin and then trial sizer showed an 8 mm graft fit appropriately.  A graft was marked anteriorly again inserted with CRNA pulling traction to enlarge the disc space.  Grafts placed flush with the  anterior cortex.  28 mm skyline Depew plate was selected and 12 mm screws x6 with C arm used to confirm good position of the graft as well as screws and plate.  Patient had been on aspirin preoperatively including taking some the day of her surgery 81 mg.   Surgical site was dry.  Lungs Lenon Curt was inspected.  Some areas were spurs been removed bone wax was applied.  Repeat irrigation,.  Hemovac placed within and out technique in line with skin incision on the left side.  Platysma closed with 3-0 Vicryl interrupted sutures 4-0 Vicryl subarticular closure.  Tincture benzoin Steri-Strips 4 x 4's soft cervical collar was applied.  Instrument count needle count was correct patient tolerated procedure well.

## 2017-09-05 NOTE — Anesthesia Postprocedure Evaluation (Signed)
Anesthesia Post Note  Patient: Charlene Allen  Procedure(s) Performed: C4-5, C5-6 ANTERIOR CERVICAL DECOMPRESSION/DISCECTOMY FUSION, ALLOGRAFT, PLATE (N/A Neck)     Patient location during evaluation: PACU Anesthesia Type: General Level of consciousness: awake and alert Pain management: pain level controlled Vital Signs Assessment: post-procedure vital signs reviewed and stable Respiratory status: spontaneous breathing, nonlabored ventilation, respiratory function stable and patient connected to nasal cannula oxygen Cardiovascular status: blood pressure returned to baseline and stable Postop Assessment: no apparent nausea or vomiting Anesthetic complications: no    Last Vitals:  Vitals:   09/05/17 1730 09/05/17 1746  BP:  132/75  Pulse:  93  Resp:  16  Temp: 37.3 C 36.9 C  SpO2:  95%    Last Pain:  Vitals:   09/05/17 1825  TempSrc:   PainSc: 4                  Mayleigh Tetrault COKER

## 2017-09-05 NOTE — Progress Notes (Signed)
Orthopedic Tech Progress Note Patient Details:  Charlene Allen October 31, 1955 [id number]  Ortho Devices Type of Ortho Device: Soft collar Ortho Device/Splint Location: at bedside Ortho Device/Splint Interventions: Criss Alvine 09/05/2017, 5:49 PM

## 2017-09-05 NOTE — Interval H&P Note (Signed)
History and Physical Interval Note:  09/05/2017 12:12 PM  Charlene Allen  has presented today for surgery, with the diagnosis of C4-5, C5-6 SEVERE STENOSIS  The various methods of treatment have been discussed with the patient and family. After consideration of risks, benefits and other options for treatment, the patient has consented to  Procedure(s): C4-5, C5-6 ANTERIOR CERVICAL DECOMPRESSION/DISCECTOMY FUSION, ALLOGRAFT, PLATE (N/A) as a surgical intervention .  The patient's history has been reviewed, patient examined, no change in status, stable for surgery.  I have reviewed the patient's chart and labs.  Questions were answered to the patient's satisfaction.     Marybelle Killings

## 2017-09-06 ENCOUNTER — Telehealth: Payer: Self-pay | Admitting: *Deleted

## 2017-09-06 DIAGNOSIS — M4722 Other spondylosis with radiculopathy, cervical region: Secondary | ICD-10-CM | POA: Diagnosis not present

## 2017-09-06 LAB — GLUCOSE, CAPILLARY: Glucose-Capillary: 167 mg/dL — ABNORMAL HIGH (ref 65–99)

## 2017-09-06 MED ORDER — METHOCARBAMOL 500 MG PO TABS: 500.0000 mg | ORAL_TABLET | Freq: Four times a day (QID) | ORAL | Status: DC | PRN

## 2017-09-06 MED ORDER — HYDROCHLOROTHIAZIDE 25 MG PO TABS: 25.0000 mg | ORAL_TABLET | Freq: Every day | ORAL | Status: DC

## 2017-09-06 MED ORDER — NITROGLYCERIN 0.4 MG SL SUBL: 0.4000 mg | SUBLINGUAL_TABLET | SUBLINGUAL | 12 refills | Status: AC | PRN

## 2017-09-06 MED ORDER — LIVING WELL WITH DIABETES BOOK
Freq: Once | Status: AC
  Administered 2017-09-06: 10:00:00
  Filled 2017-09-06: qty 1

## 2017-09-06 MED ORDER — ATORVASTATIN CALCIUM 20 MG PO TABS: 20.0000 mg | ORAL_TABLET | Freq: Every day | ORAL | Status: DC

## 2017-09-06 MED ORDER — AMLODIPINE BESYLATE 10 MG PO TABS
10.0000 mg | ORAL_TABLET | Freq: Every day | ORAL | Status: DC
Start: 2017-09-06 — End: 2017-11-15

## 2017-09-06 MED FILL — OXYCODONE-ACETAMINOPHEN 5-3: 5-325 | 6 days supply | Qty: 50 | Fill #0

## 2017-09-06 NOTE — Telephone Encounter (Signed)
Patient verified DOB. Patient states she will be released today from surgery and is requesting the DM medication her PCP stated would be available.

## 2017-09-06 NOTE — Progress Notes (Signed)
   Subjective: 1 Day Post-Op Procedure(s) (LRB): C4-5, C5-6 ANTERIOR CERVICAL DECOMPRESSION/DISCECTOMY FUSION, ALLOGRAFT, PLATE (N/A) Patient reports pain as mild and moderate.    Objective: Vital signs in last 24 hours: Temp:  [97.5 F (36.4 C)-99.1 F (37.3 C)] 97.9 F (36.6 C) (06/11 0715) Pulse Rate:  [66-97] 78 (06/11 0715) Resp:  [8-20] 16 (06/11 0715) BP: (123-174)/(51-89) 145/51 (06/11 0715) SpO2:  [91 %-97 %] 97 % (06/11 0715)  Intake/Output from previous day: 06/10 0701 - 06/11 0700 In: 1800 [I.V.:1800] Out: 500 [Drains:50; Blood:450] Intake/Output this shift: Total I/O In: -  Out: 60 [Drains:60]  Recent Labs    09/05/17 1119  HGB 13.3   Recent Labs    09/05/17 1119  HCT 39.0   Recent Labs    09/05/17 1119  NA 141  K 3.1*  GLUCOSE 120*   No results for input(s): LABPT, INR in the last 72 hours.  UE sensation improved from pre-op with relief of right hand and left arm and hand numbness. good strength. ambulatory to BR.  Dg Cervical Spine 2-3 Views  Result Date: 09/05/2017 CLINICAL DATA:  C4-6 ACDF. EXAM: CERVICAL SPINE - 2-3 VIEW; DG C-ARM 61-120 MIN COMPARISON:  07/19/2017 FINDINGS: 2 intraoperative spot fluoro films are submitted. Initial image at 1556 hours is portable cross-table lateral view of the cervical spine demonstrating anterior cervical discectomy at C4-5 and C5-6 with anterior plate extending from C4 down to C6. 2nd film is a frontal projection of the cervical anatomy demonstrating the anterior cervical plate. Endotracheal tube is evident. IMPRESSION: Intraoperative assessment during ACDF and plate from O9-G2. No evidence for immediate hardware complications. Electronically Signed   By: Misty Stanley M.D.   On: 09/05/2017 16:05   Dg C-arm 1-60 Min  Result Date: 09/05/2017 CLINICAL DATA:  C4-6 ACDF. EXAM: CERVICAL SPINE - 2-3 VIEW; DG C-ARM 61-120 MIN COMPARISON:  07/19/2017 FINDINGS: 2 intraoperative spot fluoro films are submitted. Initial  image at 1556 hours is portable cross-table lateral view of the cervical spine demonstrating anterior cervical discectomy at C4-5 and C5-6 with anterior plate extending from C4 down to C6. 2nd film is a frontal projection of the cervical anatomy demonstrating the anterior cervical plate. Endotracheal tube is evident. IMPRESSION: Intraoperative assessment during ACDF and plate from X5-M8. No evidence for immediate hardware complications. Electronically Signed   By: Misty Stanley M.D.   On: 09/05/2017 16:05    Assessment/Plan: 1 Day Post-Op Procedure(s) (LRB): C4-5, C5-6 ANTERIOR CERVICAL DECOMPRESSION/DISCECTOMY FUSION, ALLOGRAFT, PLATE (N/A) Plan: discharge home , office one week. Rx on chart.   Marybelle Killings 09/06/2017, 7:46 AM

## 2017-09-06 NOTE — Progress Notes (Signed)
Patient alert and oriented, mae's well, voiding adequate amount of urine, swallowing without difficulty, no c/o pain at time of discharge. Patient discharged home with family. Script and discharged instructions given to patient. Patient and family stated understanding of instructions given. Patient has an appointment with Dr. Yates  

## 2017-09-06 NOTE — Discharge Instructions (Signed)
You are on the same medications you came in to the hospital with plus percocet for pain and robaxin for muscle spasms. Walk daily, no lifting, keep collar on at all times except when you switch for the collar you use for shower. Dry off then remove shower collar and reapply dry collar. See Dr. [name] in one week. Use soft foods which will be easier to swallow.

## 2017-09-07 ENCOUNTER — Telehealth: Payer: Self-pay

## 2017-09-07 ENCOUNTER — Encounter (HOSPITAL_COMMUNITY): Payer: Self-pay | Admitting: Orthopaedic Surgery

## 2017-09-07 NOTE — Telephone Encounter (Signed)
Pt returned call and I went over lab results with pt and she doesn't have any questions or concerns

## 2017-09-07 NOTE — Telephone Encounter (Signed)
Contacted pt to go over lab results pt didn't answer lvm asking pt to give me a call at her earliest convenience  

## 2017-09-08 ENCOUNTER — Telehealth: Payer: Self-pay | Admitting: Internal Medicine

## 2017-09-08 ENCOUNTER — Other Ambulatory Visit: Payer: Self-pay

## 2017-09-08 DIAGNOSIS — F17200 Nicotine dependence, unspecified, uncomplicated: Secondary | ICD-10-CM

## 2017-09-08 DIAGNOSIS — R059 Cough, unspecified: Secondary | ICD-10-CM

## 2017-09-08 DIAGNOSIS — R05 Cough: Secondary | ICD-10-CM

## 2017-09-08 NOTE — Telephone Encounter (Signed)
Patient wants a glucometer sent to [facility] please call patient once its done.

## 2017-09-08 NOTE — Telephone Encounter (Signed)
Pt doesn't need a glucometer. Pt is not diabetic

## 2017-09-09 ENCOUNTER — Other Ambulatory Visit: Payer: Self-pay

## 2017-09-09 ENCOUNTER — Other Ambulatory Visit: Payer: Self-pay | Admitting: *Deleted

## 2017-09-09 MED ORDER — GLUCOSE BLOOD VI STRP: ORAL_STRIP | 12 refills | Status: AC

## 2017-09-09 MED ORDER — TRUEPLUS LANCETS 28G MISC: 12 refills | Status: AC

## 2017-09-09 MED ORDER — TRUE METRIX METER W/DEVICE KIT: PACK | 0 refills | Status: DC

## 2017-09-09 MED FILL — TRUEplus LANCETS 28G MISC: 25 days supply | Qty: 100 | Fill #0

## 2017-09-09 MED FILL — TRUE METRIX TEST STRIP: 25 days supply | Qty: 100 | Fill #0

## 2017-09-09 MED FILL — !TRUE METRIX BLOOD GLUCOSE: 365 days supply | Qty: 1 | Fill #0

## 2017-09-09 NOTE — Telephone Encounter (Signed)
MA completed the DM supply request

## 2017-09-12 ENCOUNTER — Telehealth (INDEPENDENT_AMBULATORY_CARE_PROVIDER_SITE_OTHER): Payer: Self-pay

## 2017-09-12 NOTE — Telephone Encounter (Signed)
Patient called concerning bandages being wet and wanted to know if she could change the bandage and apply a new one.  Patient had surgery on 09/05/17.  Talked with Gwinda Passe and advised her of message above, per Collier Bullock. Patient is to pat to dry, then apply a clean gauze over incision.  Talked with patient and advised.   Patient has postop appt.on 09/13/17 with Dr. [name].

## 2017-09-13 ENCOUNTER — Ambulatory Visit (INDEPENDENT_AMBULATORY_CARE_PROVIDER_SITE_OTHER): Payer: Self-pay

## 2017-09-13 ENCOUNTER — Encounter (INDEPENDENT_AMBULATORY_CARE_PROVIDER_SITE_OTHER): Payer: Self-pay | Admitting: Orthopaedic Surgery

## 2017-09-13 ENCOUNTER — Ambulatory Visit (INDEPENDENT_AMBULATORY_CARE_PROVIDER_SITE_OTHER): Payer: Self-pay | Admitting: Orthopaedic Surgery

## 2017-09-13 VITALS — BP 175/84 | HR 72 | Ht 63.0 in | Wt 183.0 lb

## 2017-09-13 DIAGNOSIS — Z981 Arthrodesis status: Secondary | ICD-10-CM

## 2017-09-13 NOTE — Progress Notes (Signed)
Post-Op Visit Note   Patient: Charlene Allen           Date of Birth: [birth year]           MRN: [id number] Visit Date: 09/13/2017 PCP: Ladell Pier, MD   Assessment & Plan: Patient returns post 2 level cervical fusion x-rays show good position of graft plate and screws.  She is gotten improvement in her left arm which is more symptomatic side but she still has some symptoms in her right arm.  Biceps triceps are strong.  She has some tenderness over the right greater trochanter.  Lower extremity reflexes are 2+ and symmetrical normal lower extremity leg strength.  Chief Complaint:  Chief Complaint  Patient presents with  . Neck - Routine Post Op   Visit Diagnoses:  1. Status post cervical spinal fusion     Plan: Recheck 2 weeks.  No x-ray needed on return.  She is changing the dressing as needed.  We discussed smoking sensation.  Continue collar.  Follow-Up Instructions: Return in about 2 weeks (around 09/27/2017).   Orders:  Orders Placed This Encounter  Procedures  . XR Cervical Spine 2 or 3 views   No orders of the defined types were placed in this encounter.   Imaging: No results found.  PMFS History: Patient Active Problem List   Diagnosis Date Noted  . Cervical spinal stenosis 09/05/2017  . Spinal stenosis of cervical region 08/05/2017  . Increased endometrial stripe thickness 09/08/2016  . Chronic neck pain 08/16/2016  . Chronic right shoulder pain 07/22/2016  . Morbid obesity due to excess calories ([place]) 06/20/2016  . COPD GOLD 0 still smoking  06/18/2016  . HPV test positive 10/13/2015  . Precordial chest pain 09/09/2015  . Mild diastolic dysfunction 55/97/4163  . Chronic diarrhea 08/13/2015  . Cigarette smoker 08/13/2015  . Chronic back pain 03/15/2013  . Essential hypertension, benign 03/15/2013  . History of substance abuse 03/15/2013   Past Medical History:  Diagnosis Date  . Diabetes mellitus without complication ([place])   . Hypertension Dx  2015  . Obesity   . PMB (postmenopausal bleeding)     Family History  Problem Relation Age of Onset  . COPD Mother   . Stroke Mother   . Heart disease Mother   . Hypertension Mother   . Cancer Sister 25       cervical cancer   . Stroke Maternal Grandmother     Past Surgical History:  Procedure Laterality Date  . ABDOMINAL AORTOGRAM N/A 07/12/2016   Procedure: Abdominal Aortogram;  Surgeon: Jettie Booze, MD;  Location: [place] [facility];  Service: Cardiovascular;  Laterality: N/A;  . ANTERIOR CERVICAL DECOMP/DISCECTOMY FUSION N/A 09/05/2017   Procedure: C4-5, C5-6 ANTERIOR CERVICAL DECOMPRESSION/DISCECTOMY FUSION, ALLOGRAFT, PLATE;  Surgeon: Marybelle Killings, MD;  Location: [place];  Service: Orthopedics;  Laterality: N/A;  . HAND SURGERY    . LEFT HEART CATH AND CORONARY ANGIOGRAPHY N/A 07/12/2016   Procedure: Left Heart Cath and Coronary Angiography;  Surgeon: Jettie Booze, MD;  Location: [facility];  Service: Cardiovascular;  Laterality: N/A;  . TUBAL LIGATION     Social History   Occupational History  . Not on file  Tobacco Use  . Smoking status: Current Every Day Smoker    Packs/day: 0.50    Years: 40.00    Pack years: 20.00    Types: Cigarettes  . Smokeless tobacco: Never Used  Substance and Sexual Activity  . Alcohol  use: No    Alcohol/week: 0.0 oz  . Drug use: No  . Sexual activity: Not Currently    Birth control/protection: None

## 2017-09-14 NOTE — Discharge Summary (Signed)
Patient ID: Charlene Allen MRN: [id number] DOB/AGE: 1955-05-17 62 y.o.  Admit date: 09/05/2017 Discharge date: 09/14/2017  Admission Diagnoses:  Active Problems:   Cervical spinal stenosis   Discharge Diagnoses:  Active Problems:   Cervical spinal stenosis  status post Procedure(s): C4-5, C5-6 ANTERIOR CERVICAL DECOMPRESSION/DISCECTOMY FUSION, ALLOGRAFT, PLATE  Past Medical History:  Diagnosis Date  . Diabetes mellitus without complication ([place])   . Hypertension Dx 2015  . Obesity   . PMB (postmenopausal bleeding)     Surgeries: Procedure(s): C4-5, C5-6 ANTERIOR CERVICAL DECOMPRESSION/DISCECTOMY FUSION, ALLOGRAFT, PLATE on 7/82/9562   Consultants:   Discharged Condition: Improved  Hospital Course: Charlene Allen is an 62 y.o. female who was admitted 09/05/2017 for operative treatment of cervical stenosis. Patient failed conservative treatments (please see the history and physical for the specifics) and had severe unremitting pain that affects sleep, daily activities and work/hobbies. After pre-op clearance, the patient was taken to the operating room on 09/05/2017 and underwent  Procedure(s): C4-5, C5-6 ANTERIOR CERVICAL DECOMPRESSION/DISCECTOMY FUSION, ALLOGRAFT, PLATE.    Patient was given perioperative antibiotics:  Anti-infectives (From admission, onward)   Start     Dose/Rate Route Frequency Ordered Stop   09/05/17 2200  ceFAZolin (ANCEF) IVPB 1 g/50 mL premix     1 g 100 mL/hr over 30 Minutes Intravenous Every 8 hours 09/05/17 1742 09/06/17 0626   09/05/17 1045  ceFAZolin (ANCEF) IVPB 2g/100 mL premix     2 g 200 mL/hr over 30 Minutes Intravenous On call to O.R. 09/05/17 1031 09/05/17 1345       Patient was given sequential compression devices and early ambulation to prevent DVT.   Patient benefited maximally from hospital stay and there were no complications. At the time of discharge, the patient was urinating/moving their bowels without difficulty,  tolerating a regular diet, pain is controlled with oral pain medications and they have been cleared by PT/OT.   Recent vital signs: No data found.   Recent laboratory studies: No results for input(s): WBC, HGB, HCT, PLT, NA, K, CL, CO2, BUN, CREATININE, GLUCOSE, INR, CALCIUM in the last 72 hours.  Invalid input(s): PT, 2   Discharge Medications:   Allergies as of 09/06/2017   No Known Allergies     Medication List    STOP taking these medications   acetaminophen-codeine 300-60 MG tablet Commonly known as:  TYLENOL #4   aspirin 81 MG tablet   diphenhydrAMINE 25 MG tablet Commonly known as:  BENADRYL   naproxen sodium 220 MG tablet Commonly known as:  ALEVE     TAKE these medications   albuterol 108 (90 Base) MCG/ACT inhaler Commonly known as:  PROVENTIL HFA;VENTOLIN HFA Inhale 2 puffs into the lungs every 6 (six) hours as needed for wheezing or shortness of breath.   amLODipine 10 MG tablet Commonly known as:  NORVASC Take 1 tablet (10 mg total) by mouth daily. What changed:  Another medication with the same name was added. Make sure you understand how and when to take each.   amLODipine 10 MG tablet Commonly known as:  NORVASC Take 1 tablet (10 mg total) by mouth daily. What changed:  You were already taking a medication with the same name, and this prescription was added. Make sure you understand how and when to take each.   atorvastatin 20 MG tablet Commonly known as:  LIPITOR Take 1 tablet (20 mg total) by mouth daily. What changed:  Another medication with the same name was added. Make sure you understand how  and when to take each.   atorvastatin 20 MG tablet Commonly known as:  LIPITOR Take 1 tablet (20 mg total) by mouth daily at 6 PM. What changed:  You were already taking a medication with the same name, and this prescription was added. Make sure you understand how and when to take each.   budesonide-formoterol 80-4.5 MCG/ACT inhaler Commonly known as:   SYMBICORT Take 2 puffs first thing in am and then another 2 puffs about 12 hours later.   clonazePAM 0.5 MG tablet Commonly known as:  KLONOPIN Take 1 tablet (0.5 mg total) by mouth 2 (two) times daily as needed for up to 14 days for anxiety.   fluticasone 50 MCG/ACT nasal spray Commonly known as:  FLONASE Place 1 spray into both nostrils daily as needed for allergies or rhinitis.   hydrochlorothiazide 25 MG tablet Commonly known as:  HYDRODIURIL Take 1 tablet (25 mg total) by mouth daily. What changed:  Another medication with the same name was added. Make sure you understand how and when to take each.   hydrochlorothiazide 25 MG tablet Commonly known as:  HYDRODIURIL Take 1 tablet (25 mg total) by mouth daily. What changed:  You were already taking a medication with the same name, and this prescription was added. Make sure you understand how and when to take each.   metFORMIN 500 MG tablet Commonly known as:  GLUCOPHAGE Take 0.5 tablets (250 mg total) by mouth daily with breakfast.   methocarbamol 500 MG tablet Commonly known as:  ROBAXIN Take 1 tablet (500 mg total) by mouth every 6 (six) hours as needed for muscle spasms. What changed:  when to take this   methocarbamol 500 MG tablet Commonly known as:  ROBAXIN Take 1 tablet (500 mg total) by mouth every 6 (six) hours as needed for muscle spasms. What changed:  You were already taking a medication with the same name, and this prescription was added. Make sure you understand how and when to take each.   nicotine 14 mg/24hr patch Commonly known as:  NICODERM CQ - dosed in mg/24 hours Place 1 patch (14 mg total) onto the skin daily.   nitroGLYCERIN 0.4 MG SL tablet Commonly known as:  NITROSTAT Place 1 tablet (0.4 mg total) under the tongue every 5 (five) minutes as needed for chest pain. If you need more than 2 doses call 911 What changed:  Another medication with the same name was added. Make sure you understand how and  when to take each.   nitroGLYCERIN 0.4 MG SL tablet Commonly known as:  NITROSTAT Place 1 tablet (0.4 mg total) under the tongue every 5 (five) minutes as needed for chest pain. What changed:  You were already taking a medication with the same name, and this prescription was added. Make sure you understand how and when to take each.   oxyCODONE-acetaminophen 5-325 MG tablet Commonly known as:  PERCOCET/ROXICET Take 1-2 tablets by mouth every 6 (six) hours as needed for severe pain.   potassium chloride SA 20 MEQ tablet Commonly known as:  K-DUR,KLOR-CON 2 tabs PO x1 dose then 1 tab PO daily.   pregabalin 75 MG capsule Commonly known as:  LYRICA Take 1 capsule (75 mg total) by mouth 3 (three) times daily. For PASS What changed:    when to take this  additional instructions       Diagnostic Studies: Dg Cervical Spine 2-3 Views  Result Date: 09/05/2017 CLINICAL DATA:  C4-6 ACDF. EXAM: CERVICAL SPINE -  2-3 VIEW; DG C-ARM 61-120 MIN COMPARISON:  07/19/2017 FINDINGS: 2 intraoperative spot fluoro films are submitted. Initial image at 1556 hours is portable cross-table lateral view of the cervical spine demonstrating anterior cervical discectomy at C4-5 and C5-6 with anterior plate extending from C4 down to C6. 2nd film is a frontal projection of the cervical anatomy demonstrating the anterior cervical plate. Endotracheal tube is evident. IMPRESSION: Intraoperative assessment during ACDF and plate from W3-U8. No evidence for immediate hardware complications. Electronically Signed   By: Misty Stanley M.D.   On: 09/05/2017 16:05   Dg C-arm 1-60 Min  Result Date: 09/05/2017 CLINICAL DATA:  C4-6 ACDF. EXAM: CERVICAL SPINE - 2-3 VIEW; DG C-ARM 61-120 MIN COMPARISON:  07/19/2017 FINDINGS: 2 intraoperative spot fluoro films are submitted. Initial image at 1556 hours is portable cross-table lateral view of the cervical spine demonstrating anterior cervical discectomy at C4-5 and C5-6 with anterior  plate extending from C4 down to C6. 2nd film is a frontal projection of the cervical anatomy demonstrating the anterior cervical plate. Endotracheal tube is evident. IMPRESSION: Intraoperative assessment during ACDF and plate from E2-C0. No evidence for immediate hardware complications. Electronically Signed   By: Misty Stanley M.D.   On: 09/05/2017 16:05      Follow-up Information    Marybelle Killings, MD Follow up.   Specialty:  Orthopedic Surgery Contact information: [place] [address] [phone]           Discharge Plan:  discharge to home  Disposition:     Signed: Benjiman Core 09/14/2017, 4:09 PM

## 2017-09-15 ENCOUNTER — Ambulatory Visit: Payer: Self-pay | Attending: Internal Medicine | Admitting: Physician Assistant

## 2017-09-15 VITALS — BP 122/75 | HR 74 | Temp 98.6°F | Resp 18 | Ht 63.0 in | Wt 173.0 lb

## 2017-09-15 DIAGNOSIS — E669 Obesity, unspecified: Secondary | ICD-10-CM | POA: Insufficient documentation

## 2017-09-15 DIAGNOSIS — I10 Essential (primary) hypertension: Secondary | ICD-10-CM | POA: Insufficient documentation

## 2017-09-15 DIAGNOSIS — Z79899 Other long term (current) drug therapy: Secondary | ICD-10-CM | POA: Insufficient documentation

## 2017-09-15 DIAGNOSIS — Z981 Arthrodesis status: Secondary | ICD-10-CM | POA: Insufficient documentation

## 2017-09-15 DIAGNOSIS — E1165 Type 2 diabetes mellitus with hyperglycemia: Secondary | ICD-10-CM | POA: Insufficient documentation

## 2017-09-15 LAB — GLUCOSE, POCT (MANUAL RESULT ENTRY): POC GLUCOSE: 127 mg/dL — AB (ref 70–99)

## 2017-09-15 MED ORDER — METFORMIN HCL 500 MG PO TABS: 500.0000 mg | ORAL_TABLET | Freq: Two times a day (BID) | ORAL | 3 refills | Status: DC

## 2017-09-15 NOTE — Patient Instructions (Signed)
Increase your metformin to one in the morning, then after 1 week, 1 tab twice daily   Diabetes Mellitus and Nutrition When you have diabetes (diabetes mellitus), it is very important to have healthy eating habits because your blood sugar (glucose) levels are greatly affected by what you eat and drink. Eating healthy foods in the appropriate amounts, at about the same times every day, can help you:  Control your blood glucose.  Lower your risk of heart disease.  Improve your blood pressure.  Reach or maintain a healthy weight.  Every person with diabetes is different, and each person has different needs for a meal plan. Your health care provider may recommend that you work with a diet and nutrition specialist (dietitian) to make a meal plan that is best for you. Your meal plan may vary depending on factors such as:  The calories you need.  The medicines you take.  Your weight.  Your blood glucose, blood pressure, and cholesterol levels.  Your activity level.  Other health conditions you have, such as heart or kidney disease.  How do carbohydrates affect me? Carbohydrates affect your blood glucose level more than any other type of food. Eating carbohydrates naturally increases the amount of glucose in your blood. Carbohydrate counting is a method for keeping track of how many carbohydrates you eat. Counting carbohydrates is important to keep your blood glucose at a healthy level, especially if you use insulin or take certain oral diabetes medicines. It is important to know how many carbohydrates you can safely have in each meal. This is different for every person. Your dietitian can help you calculate how many carbohydrates you should have at each meal and for snack. Foods that contain carbohydrates include:  Bread, cereal, rice, pasta, and crackers.  Potatoes and corn.  Peas, beans, and lentils.  Milk and yogurt.  Fruit and juice.  Desserts, such as cakes, cookies, ice  cream, and candy.  How does alcohol affect me? Alcohol can cause a sudden decrease in blood glucose (hypoglycemia), especially if you use insulin or take certain oral diabetes medicines. Hypoglycemia can be a life-threatening condition. Symptoms of hypoglycemia (sleepiness, dizziness, and confusion) are similar to symptoms of having too much alcohol. If your health care provider says that alcohol is safe for you, follow these guidelines:  Limit alcohol intake to no more than 1 drink per day for nonpregnant women and 2 drinks per day for men. One drink equals 12 oz of beer, 5 oz of wine, or 1 oz of hard liquor.  Do not drink on an empty stomach.  Keep yourself hydrated with water, diet soda, or unsweetened iced tea.  Keep in mind that regular soda, juice, and other mixers may contain a lot of sugar and must be counted as carbohydrates.  What are tips for following this plan? Reading food labels  Start by checking the serving size on the label. The amount of calories, carbohydrates, fats, and other nutrients listed on the label are based on one serving of the food. Many foods contain more than one serving per package.  Check the total grams (g) of carbohydrates in one serving. You can calculate the number of servings of carbohydrates in one serving by dividing the total carbohydrates by 15. For example, if a food has 30 g of total carbohydrates, it would be equal to 2 servings of carbohydrates.  Check the number of grams (g) of saturated and trans fats in one serving. Choose foods that have low or no  amount of these fats.  Check the number of milligrams (mg) of sodium in one serving. Most people should limit total sodium intake to less than 2,300 mg per day.  Always check the nutrition information of foods labeled as "low-fat" or "nonfat". These foods may be higher in added sugar or refined carbohydrates and should be avoided.  Talk to your dietitian to identify your daily goals for  nutrients listed on the label. Shopping  Avoid buying canned, premade, or processed foods. These foods tend to be high in fat, sodium, and added sugar.  Shop around the outside edge of the grocery store. This includes fresh fruits and vegetables, bulk grains, fresh meats, and fresh dairy. Cooking  Use low-heat cooking methods, such as baking, instead of high-heat cooking methods like deep frying.  Cook using healthy oils, such as olive, canola, or sunflower oil.  Avoid cooking with butter, cream, or high-fat meats. Meal planning  Eat meals and snacks regularly, preferably at the same times every day. Avoid going long periods of time without eating.  Eat foods high in fiber, such as fresh fruits, vegetables, beans, and whole grains. Talk to your dietitian about how many servings of carbohydrates you can eat at each meal.  Eat 4-6 ounces of lean protein each day, such as lean meat, chicken, fish, eggs, or tofu. 1 ounce is equal to 1 ounce of meat, chicken, or fish, 1 egg, or 1/4 cup of tofu.  Eat some foods each day that contain healthy fats, such as avocado, nuts, seeds, and fish. Lifestyle   Check your blood glucose regularly.  Exercise at least 30 minutes 5 or more days each week, or as told by your health care provider.  Take medicines as told by your health care provider.  Do not use any products that contain nicotine or tobacco, such as cigarettes and e-cigarettes. If you need help quitting, ask your health care provider.  Work with a Social worker or diabetes educator to identify strategies to manage stress and any emotional and social challenges. What are some questions to ask my health care provider?  Do I need to meet with a diabetes educator?  Do I need to meet with a dietitian?  What number can I call if I have questions?  When are the best times to check my blood glucose? Where to find more information:  American Diabetes Association:  diabetes.org/food-and-fitness/food  Academy of Nutrition and Dietetics: PokerClues.dk  [employer] of Diabetes and Digestive and Kidney Diseases (NIH): ContactWire.be Summary  A healthy meal plan will help you control your blood glucose and maintain a healthy lifestyle.  Working with a diet and nutrition specialist (dietitian) can help you make a meal plan that is best for you.  Keep in mind that carbohydrates and alcohol have immediate effects on your blood glucose levels. It is important to count carbohydrates and to use alcohol carefully. This information is not intended to replace advice given to you by your health care provider. Make sure you discuss any questions you have with your health care provider. Document Released: 12/10/2004 Document Revised: 04/19/2016 Document Reviewed: 04/19/2016 Elsevier Interactive Patient Education  [employer].

## 2017-09-15 NOTE — Progress Notes (Signed)
Patient ID: Charlene Allen, female   DOB: March 22, 1956, 62 y.o.   MRN: [id number]     Charlene Allen, is a 62 y.o. female  OQH:[id number]  [id number]:[id number]  DOB - 1956-03-06  Subjective:  Chief Complaint and HPI: Charlene Allen is a 62 y.o. female here today C-spine fusion June 10th, 2019.  Newly diagnosed with diabetes and wants education on it.  Has been started on 27m metformin in the mornings and is tolerating that well.  Doing well since surgery.  Willing to make dietary changes.  No f/c.  Checking blood sugars at home-120s to 170s.     ROS:   Constitutional:  No f/c, No night sweats, No unexplained weight loss. EENT:  No vision changes, No blurry vision, No hearing changes. No mouth, throat, or ear problems.  Respiratory: No cough, No SOB Cardiac: No CP, no palpitations GI:  No abd pain, No N/V/D. GU: No Urinary s/sx Musculoskeletal: No joint pain Neuro: No headache, no dizziness, no motor weakness.  Skin: No rash Endocrine:  No polydipsia. No polyuria.  Psych: Denies SI/HI  No problems updated.  ALLERGIES: No Known Allergies  PAST MEDICAL HISTORY: Past Medical History:  Diagnosis Date  . Diabetes mellitus without complication ([place]   . Hypertension Dx 2015  . Obesity   . PMB (postmenopausal bleeding)     MEDICATIONS AT HOME: Prior to Admission medications   Medication Sig Start Date End Date Taking? Authorizing Provider  albuterol (PROVENTIL HFA;VENTOLIN HFA) 108 (90 Base) MCG/ACT inhaler Inhale 2 puffs into the lungs every 6 (six) hours as needed for wheezing or shortness of breath. 04/26/17   JLadell Pier MD  amLODipine (NORVASC) 10 MG tablet Take 1 tablet (10 mg total) by mouth daily. 12/14/16   JLadell Pier MD  amLODipine (NORVASC) 10 MG tablet Take 1 tablet (10 mg total) by mouth daily. 09/06/17   YMarybelle Killings MD  atorvastatin (LIPITOR) 20 MG tablet Take 1 tablet (20 mg total) by mouth daily. 12/14/16   JLadell Pier MD  atorvastatin  (LIPITOR) 20 MG tablet Take 1 tablet (20 mg total) by mouth daily at 6 PM. 09/06/17   YMarybelle Killings MD  Blood Glucose Monitoring Suppl (TRUE METRIX METER) w/Device KIT Use as directed 09/09/17   JLadell Pier MD  budesonide-formoterol (SYMBICORT) 80-4.5 MCG/ACT inhaler Take 2 puffs first thing in am and then another 2 puffs about 12 hours later. Patient not taking: Reported on 08/25/2017 06/18/16   WTanda Rockers MD  clonazePAM (KLONOPIN) 0.5 MG tablet Take 1 tablet (0.5 mg total) by mouth 2 (two) times daily as needed for up to 14 days for anxiety. Patient not taking: Reported on 08/25/2017 07/30/17 08/25/17  OJacqualine Mau NP  fluticasone ([facility] 50 MCG/ACT nasal spray Place 1 spray into both nostrils daily as needed for allergies or rhinitis.    [provider]  glucose blood (TRUE METRIX BLOOD GLUCOSE TEST) test strip Use as instructed 09/09/17   JLadell Pier MD  hydrochlorothiazide (HYDRODIURIL) 25 MG tablet Take 1 tablet (25 mg total) by mouth daily. 07/21/17   JLadell Pier MD  hydrochlorothiazide (HYDRODIURIL) 25 MG tablet Take 1 tablet (25 mg total) by mouth daily. 09/06/17   YMarybelle Killings MD  metFORMIN (GLUCOPHAGE) 500 MG tablet Take 1 tablet (500 mg total) by mouth 2 (two) times daily with a meal. 09/15/17   Ronnita Paz, ADionne Bucy PA-C  methocarbamol (ROBAXIN) 500 MG tablet Take 1 tablet (500 mg total)  by mouth every 6 (six) hours as needed for muscle spasms. 09/05/17   Lanae Crumbly, PA-C  methocarbamol (ROBAXIN) 500 MG tablet Take 1 tablet (500 mg total) by mouth every 6 (six) hours as needed for muscle spasms. 09/06/17   Marybelle Killings, MD  nicotine (NICODERM CQ - DOSED IN MG/24 HOURS) 14 mg/24hr patch Place 1 patch (14 mg total) onto the skin daily. 07/22/16   Funches, Adriana Mccallum, MD  nitroGLYCERIN (NITROSTAT) 0.4 MG SL tablet Place 1 tablet (0.4 mg total) under the tongue every 5 (five) minutes as needed for chest pain. If you need more than 2 doses call 911 09/09/15    Funches, Adriana Mccallum, MD  nitroGLYCERIN (NITROSTAT) 0.4 MG SL tablet Place 1 tablet (0.4 mg total) under the tongue every 5 (five) minutes as needed for chest pain. 09/06/17   Marybelle Killings, MD  oxyCODONE-acetaminophen (PERCOCET/ROXICET) 5-325 MG tablet Take 1-2 tablets by mouth every 6 (six) hours as needed for severe pain. Patient not taking: Reported on 09/13/2017 09/05/17   Lanae Crumbly, PA-C  potassium chloride SA (K-DUR,KLOR-CON) 20 MEQ tablet 2 tabs PO x1 dose then 1 tab PO daily. 08/30/17   Ladell Pier, MD  pregabalin (LYRICA) 75 MG capsule Take 1 capsule (75 mg total) by mouth 3 (three) times daily. For PASS Patient taking differently: Take 75 mg by mouth daily. For PASS 07/21/17   Ladell Pier, MD  TRUEPLUS LANCETS 28G MISC Use as directed 09/09/17   Ladell Pier, MD     Objective:  EXAM:   Vitals:   09/15/17 0947  BP: 122/75  Pulse: 74  Resp: 18  Temp: 98.6 F (37 C)  TempSrc: Oral  SpO2: 98%  Weight: 173 lb (78.5 kg)  Height: _0  (1.6 m)    General appearance : A&OX3. NAD. Non-toxic-appearing, wearing c-spine collar HEENT: Atraumatic and Normocephalic.  PERRLA. EOM intact.   Neck: supple, no JVD. No cervical lymphadenopathy. No thyromegaly Chest/Lungs:  Breathing-non-labored, Good air entry bilaterally, breath sounds normal without rales, rhonchi, or wheezing  CVS: S1 S2 regular, no murmurs, gallops, rubs  Extremities: Bilateral Lower Ext shows no edema, both legs are warm to touch with = pulse throughout Neurology:  CN II-XII grossly intact, Non focal.   Psych:  TP linear. J/I WNL. Normal speech. Appropriate eye contact and affect.  Skin:  No Rash  Data Review Lab Results  Component Value Date   HGBA1C 6.5 (H) 09/01/2017   HGBA1C 6.2 (H) 08/12/2015   HGBA1C 6.0 05/14/2015     Assessment & Plan   1. Type 2 diabetes mellitus with hyperglycemia, without long-term current use of insulin (HCC) Increase metformin to 586m bid with titration from  2551m- Glucose (CBG) Counseled >3047m on medications, compliance and adherence to diabetic diet and even possibilty of this being reversible with aggressive changes.  She seems ready for changes.    2. Essential hypertension, benign At goal-continue current regimen     Patient have been counseled extensively about nutrition and exercise  Return in about 2 months (around 11/15/2017) for Dr JohWynetta Emeryr Dm f/up.  The patient was given clear instructions to go to ER or return to medical center if symptoms don't improve, worsen or new problems develop. The patient verbalized understanding. The patient was told to call to get lab results if they haven't heard anything in the next week.     AngFreeman CaldronA-C [facility] [place]  09/15/2017, 10:42 AM

## 2017-09-20 ENCOUNTER — Telehealth: Payer: Self-pay | Admitting: Internal Medicine

## 2017-09-20 NOTE — Telephone Encounter (Signed)
Patient called asking for a refill on tylenol 4. She stated sh needed to have them so they can be filled by Saturday.

## 2017-09-21 NOTE — Telephone Encounter (Signed)
Pt states she can't take the oxy and Dr. [name] knows that  Because it makes her throw up. Pt states she was only able to the oxy for 2 days after surgery. Pt states she has been doubling up on the tylenol 4. I informed pt that since she was unable to take the oxy she should have informed us and informed us on her doubling up on the tylenol 4. I informed pt that she is not due for a refill on the tylenol 4 till the end of next week. Pt states she is not completely out she just wanted to get the refill before she rans out and her pharmacy is not opening on sundays. I informed pt that I will have to let Dr. Wynetta Emery know and when I get a response I will give her a call.

## 2017-09-22 MED ORDER — ACETAMINOPHEN-CODEINE #4 300-60 MG PO TABS: 1.0000 | ORAL_TABLET | Freq: Four times a day (QID) | ORAL | 0 refills | Status: DC | PRN

## 2017-09-22 NOTE — Telephone Encounter (Signed)
Contacted pt and left a detailed vm making aware rx is ready and fill date is July 3

## 2017-09-26 ENCOUNTER — Telehealth: Payer: Self-pay | Admitting: Internal Medicine

## 2017-09-26 ENCOUNTER — Other Ambulatory Visit: Payer: Self-pay | Admitting: Internal Medicine

## 2017-09-26 DIAGNOSIS — E876 Hypokalemia: Secondary | ICD-10-CM

## 2017-09-26 NOTE — Telephone Encounter (Signed)
Patient called for a refill on tylenol #4

## 2017-09-26 NOTE — Telephone Encounter (Signed)
Prescription is up front.

## 2017-09-26 NOTE — Telephone Encounter (Signed)
I called and informed patient that her that the medication was ready for pick up.

## 2017-09-27 ENCOUNTER — Ambulatory Visit (INDEPENDENT_AMBULATORY_CARE_PROVIDER_SITE_OTHER): Payer: Self-pay | Admitting: Orthopaedic Surgery

## 2017-09-27 ENCOUNTER — Encounter (INDEPENDENT_AMBULATORY_CARE_PROVIDER_SITE_OTHER): Payer: Self-pay | Admitting: Orthopaedic Surgery

## 2017-09-27 VITALS — BP 134/87 | HR 78 | Ht 63.0 in | Wt 173.0 lb

## 2017-09-27 DIAGNOSIS — M7061 Trochanteric bursitis, right hip: Secondary | ICD-10-CM

## 2017-09-27 DIAGNOSIS — M4316 Spondylolisthesis, lumbar region: Secondary | ICD-10-CM

## 2017-09-27 DIAGNOSIS — M4726 Other spondylosis with radiculopathy, lumbar region: Secondary | ICD-10-CM

## 2017-09-27 DIAGNOSIS — Z981 Arthrodesis status: Secondary | ICD-10-CM

## 2017-09-27 NOTE — Progress Notes (Signed)
Office Visit Note   Patient: Charlene Allen           Date of Birth: 12-21-1955           MRN: [id number] Visit Date: 09/27/2017              Requested by: Ladell Pier, MD [address] [place], [place] [address] PCP: Ladell Pier, MD   Assessment & Plan: Visit Diagnoses:  1. Status post cervical spinal fusion   2. Other spondylosis with radiculopathy, lumbar region   3. Spondylolisthesis, lumbar region   4. Greater trochanteric bursitis of right hip     Plan: Today in clinic patient's fingerstick glucose 162.  I was planning to try diagnostic/therapeutic right hip greater trochanter bursa Marcaine/Depo-Medrol injection but I advised patient that this cannot be done due to her blood sugar being high.  I will have her follow-up with me in 1 week and she will see if she can get this down.  We will recheck blood sugar when she returns and that I may consider injection at that time.  She can use ice off and on as needed.  Again regards to her neck she will continue collar until she sees Dr. [name] in 3 weeks.  At that visit with him we will plan to get repeat x-rays with flexion-extension views.  Again smoking cessation discussed.  We may consider also getting NCV/EMG study left upper extremity if she has not had this done recently to better assess the numbness that she is describing in the ulnar aspect of her left lower arm.  Follow-Up Instructions: Return in about 1 week (around 10/04/2017) for with Bianka Liberati recheck right hip and blood sugar.   Orders:  No orders of the defined types were placed in this encounter.  No orders of the defined types were placed in this encounter.     Procedures: No procedures performed   Clinical Data: No additional findings.   Subjective: Chief Complaint  Patient presents with  . Neck - Follow-up, Routine Post Op    HPI 62 year-old white female who is about 3 weeks status post C4-5 and C5-6 ACDF returns.  Her surgical incision is  healed.  States that her preop left arm pain is gone but continues to have numbness and tingling in the lower ulnar aspect to her hand.  No complaints of weakness.  Patient is complaining of worsening low back pain that radiates down her right leg.  Complaining of a fair amount of right lateral hip pain as well.  Dr. [name] advised patient last visit that she had greater trochanteric bursitis.  Patient had lumbar spine MRI February 02, 2016 and that report showed advanced facet arthropathy at L3-4 and L4-5 with chronic grade 1 anterolisthesis at L4-5.  Patient states that she was recently diagnosed with diabetes and is not consistent with checking blood sugars. Review of Systems No current cardiac pulmonary GI GU issues  Objective: Vital Signs: BP 134/87   Pulse 78   Ht 5\' 3"  (1.6 m)   Wt 173 lb (78.5 kg)   BMI 30.65 kg/m   Physical Exam Examination of patient's neck her surgical incision is well-healed.  No redness.  No drainage.  Gait is slightly antalgic.  She has marked markedly tender over the right hip greater trochanter bursa.  Decreased sensation light touch ulnar aspect of the left forearm. Ortho Exam  Specialty Comments:  No specialty comments available.  Imaging: No results found.   [place]  History: Patient Active Problem List   Diagnosis Date Noted  . Cervical spinal stenosis 09/05/2017  . Spinal stenosis of cervical region 08/05/2017  . Increased endometrial stripe thickness 09/08/2016  . Chronic neck pain 08/16/2016  . Chronic right shoulder pain 07/22/2016  . Morbid obesity due to excess calories ([place]) 06/20/2016  . COPD GOLD 0 still smoking  06/18/2016  . HPV test positive 10/13/2015  . Precordial chest pain 09/09/2015  . Mild diastolic dysfunction 03/50/0938  . Chronic diarrhea 08/13/2015  . Cigarette smoker 08/13/2015  . Chronic back pain 03/15/2013  . Essential hypertension, benign 03/15/2013  . History of substance abuse 03/15/2013   Past Medical History:    Diagnosis Date  . Diabetes mellitus without complication ([place])   . Hypertension Dx 2015  . Obesity   . PMB (postmenopausal bleeding)     Family History  Problem Relation Age of Onset  . COPD Mother   . Stroke Mother   . Heart disease Mother   . Hypertension Mother   . Cancer Sister 25       cervical cancer   . Stroke Maternal Grandmother     Past Surgical History:  Procedure Laterality Date  . ABDOMINAL AORTOGRAM N/A 07/12/2016   Procedure: Abdominal Aortogram;  Surgeon: Jettie Booze, MD;  Location: [facility];  Service: Cardiovascular;  Laterality: N/A;  . ANTERIOR CERVICAL DECOMP/DISCECTOMY FUSION N/A 09/05/2017   Procedure: C4-5, C5-6 ANTERIOR CERVICAL DECOMPRESSION/DISCECTOMY FUSION, ALLOGRAFT, PLATE;  Surgeon: Marybelle Killings, MD;  Location: [facility];  Service: Orthopedics;  Laterality: N/A;  . HAND SURGERY    . LEFT HEART CATH AND CORONARY ANGIOGRAPHY N/A 07/12/2016   Procedure: Left Heart Cath and Coronary Angiography;  Surgeon: Jettie Booze, MD;  Location: [facility];  Service: Cardiovascular;  Laterality: N/A;  . TUBAL LIGATION     Social History   Occupational History  . Not on file  Tobacco Use  . Smoking status: Current Every Day Smoker    Packs/day: 0.50    Years: 40.00    Pack years: 20.00    Types: Cigarettes  . Smokeless tobacco: Never Used  Substance and Sexual Activity  . Alcohol use: No    Alcohol/week: 0.0 oz  . Drug use: No  . Sexual activity: Not Currently    Birth control/protection: None

## 2017-10-06 ENCOUNTER — Ambulatory Visit (INDEPENDENT_AMBULATORY_CARE_PROVIDER_SITE_OTHER): Payer: Self-pay | Admitting: Surgery

## 2017-10-10 MED FILL — metFORMIN HCL 500 MG TABS: 500 | 30 days supply | Qty: 15 | Fill #0

## 2017-10-10 MED FILL — AMLODIPINE BESYLATE 10 MG T: 10 | 30 days supply | Qty: 30 | Fill #8

## 2017-10-10 MED FILL — POTASSIUM CL ER 20 MEQ TAB: 20 | 30 days supply | Qty: 31 | Fill #0

## 2017-10-12 ENCOUNTER — Ambulatory Visit (INDEPENDENT_AMBULATORY_CARE_PROVIDER_SITE_OTHER): Payer: Self-pay | Admitting: Surgery

## 2017-10-12 ENCOUNTER — Encounter (INDEPENDENT_AMBULATORY_CARE_PROVIDER_SITE_OTHER): Payer: Self-pay | Admitting: Surgery

## 2017-10-12 VITALS — BP 145/84 | HR 65 | Ht 63.0 in | Wt 173.0 lb

## 2017-10-12 DIAGNOSIS — M7061 Trochanteric bursitis, right hip: Secondary | ICD-10-CM

## 2017-10-18 NOTE — Telephone Encounter (Signed)
Patient called for asking for pink eye medication. Please let patient know if you can fill this.

## 2017-10-18 NOTE — Telephone Encounter (Signed)
Called patient 3 times to inform the patient that the medication could not be ordered without a visit

## 2017-10-18 NOTE — Telephone Encounter (Signed)
Pt must be seen to get meds for possible pink eye, she should make appt or be seen @ urgent care

## 2017-10-19 ENCOUNTER — Ambulatory Visit (INDEPENDENT_AMBULATORY_CARE_PROVIDER_SITE_OTHER): Payer: Self-pay | Admitting: Orthopaedic Surgery

## 2017-10-19 ENCOUNTER — Encounter (INDEPENDENT_AMBULATORY_CARE_PROVIDER_SITE_OTHER): Payer: Self-pay | Admitting: Orthopaedic Surgery

## 2017-10-19 ENCOUNTER — Ambulatory Visit (INDEPENDENT_AMBULATORY_CARE_PROVIDER_SITE_OTHER): Payer: Self-pay

## 2017-10-19 VITALS — BP 145/85 | HR 76 | Ht 63.0 in | Wt 173.0 lb

## 2017-10-19 DIAGNOSIS — Z981 Arthrodesis status: Secondary | ICD-10-CM

## 2017-10-19 NOTE — Progress Notes (Addendum)
Office Visit Note   Patient: Charlene Allen           Date of Birth: 13-Jan-1956           MRN: [id number] Visit Date: 10/19/2017              Requested by: Ladell Pier, MD [address] [place], [place] [address] PCP: Ladell Pier, MD   Assessment & Plan: Visit Diagnoses:  1. Status post cervical spinal fusion     Plan: X-rays demonstrate no motion cervical fusion incision looks good she can discontinue the collar continue a walking program.  She is had problems with her back in the past and plain radiographs showed some anterolisthesis at L4-5.  She had some numbness laterally in her right thigh.  Follow-Up Instructions: No follow-ups on file.   Orders:  Orders Placed This Encounter  Procedures  . XR Cervical Spine 2 or 3 views   No orders of the defined types were placed in this encounter.     Procedures: No procedures performed   Clinical Data: No additional findings.   Subjective: Chief Complaint  Patient presents with  . Neck - Routine Post Op    HPI follow-up 6 weeks post 2 level cervical fusion.  She had some residual numbness in her left medial forearm that extends from the the medial epicondyle stops at the wrist.  Does not extend to the fingers.  She is also had some problems with back pain some numbness laterally on her right thigh.  Review of Systems updated and unchanged.   Objective: Vital Signs: BP (!) 145/85   Pulse 76   Ht 5\' 3"  (1.6 m)   Wt 173 lb (78.5 kg)   BMI 30.65 kg/m   Physical Exam  Constitutional: She is oriented to person, place, and time. She appears well-developed.  HENT:  Head: Normocephalic.  Right Ear: External ear normal.  Left Ear: External ear normal.  Eyes: Pupils are equal, round, and reactive to light.  Neck: No tracheal deviation present. No thyromegaly present.  Cardiovascular: Normal rate.  Pulmonary/Chest: Effort normal.  Abdominal: Soft.  Genitourinary: No vaginal discharge found.    Neurological: She is alert and oriented to person, place, and time.  Skin: Skin is warm and dry.  Psychiatric: She has a normal mood and affect. Her behavior is normal.    Ortho Exam well-healed anterior cervical incision.  Specialty Comments:  No specialty comments available.  Imaging: Xr Cervical Spine 2 Or 3 Views  Result Date: 10/19/2017 AP lateral cervical spine x-rays with flexion-extension x-rays obtained.  This shows no motion at C4-5 C5-6 post anterior cervical discectomy and fusion.  There is some disc space narrowing at C6-7. Impression: Satisfactory 2 level cervical fusion without motion    PMFS History: Patient Active Problem List   Diagnosis Date Noted  . Cervical spinal stenosis 09/05/2017  . Spinal stenosis of cervical region 08/05/2017  . Increased endometrial stripe thickness 09/08/2016  . Chronic neck pain 08/16/2016  . Chronic right shoulder pain 07/22/2016  . Morbid obesity due to excess calories ([place]) 06/20/2016  . COPD GOLD 0 still smoking  06/18/2016  . HPV test positive 10/13/2015  . Precordial chest pain 09/09/2015  . Mild diastolic dysfunction 84/13/2440  . Chronic diarrhea 08/13/2015  . Cigarette smoker 08/13/2015  . Chronic back pain 03/15/2013  . Essential hypertension, benign 03/15/2013  . History of substance abuse 03/15/2013   Past Medical History:  Diagnosis Date  . Diabetes  mellitus without complication ([facility])   . Hypertension Dx 2015  . Obesity   . PMB (postmenopausal bleeding)     Family History  Problem Relation Age of Onset  . COPD Mother   . Stroke Mother   . Heart disease Mother   . Hypertension Mother   . Cancer Sister 25       cervical cancer   . Stroke Maternal Grandmother     Past Surgical History:  Procedure Laterality Date  . ABDOMINAL AORTOGRAM N/A 07/12/2016   Procedure: Abdominal Aortogram;  Surgeon: Jettie Booze, MD;  Location: [facility];  Service: Cardiovascular;  Laterality: N/A;  . ANTERIOR  CERVICAL DECOMP/DISCECTOMY FUSION N/A 09/05/2017   Procedure: C4-5, C5-6 ANTERIOR CERVICAL DECOMPRESSION/DISCECTOMY FUSION, ALLOGRAFT, PLATE;  Surgeon: Marybelle Killings, MD;  Location: [facility];  Service: Orthopedics;  Laterality: N/A;  . HAND SURGERY    . LEFT HEART CATH AND CORONARY ANGIOGRAPHY N/A 07/12/2016   Procedure: Left Heart Cath and Coronary Angiography;  Surgeon: Jettie Booze, MD;  Location: [facility];  Service: Cardiovascular;  Laterality: N/A;  . TUBAL LIGATION     Social History   Occupational History  . Not on file  Tobacco Use  . Smoking status: Current Every Day Smoker    Packs/day: 0.50    Years: 40.00    Pack years: 20.00    Types: Cigarettes  . Smokeless tobacco: Never Used  Substance and Sexual Activity  . Alcohol use: No    Alcohol/week: 0.0 oz  . Drug use: No  . Sexual activity: Not Currently    Birth control/protection: None

## 2017-10-20 ENCOUNTER — Ambulatory Visit: Payer: Self-pay

## 2017-10-20 ENCOUNTER — Other Ambulatory Visit: Payer: Self-pay | Admitting: Physician Assistant

## 2017-10-20 MED ORDER — METFORMIN HCL 500 MG PO TABS: 500.0000 mg | ORAL_TABLET | Freq: Two times a day (BID) | ORAL | 3 refills | Status: DC

## 2017-10-20 MED FILL — ?METFORMIN HCL 500MG TABS: 500 | 30 days supply | Qty: 60 | Fill #0

## 2017-10-24 ENCOUNTER — Telehealth: Payer: Self-pay | Admitting: Internal Medicine

## 2017-10-24 NOTE — Telephone Encounter (Signed)
Pt called to request a medication refill -acetaminophen-codeine (TYLENOL #4) 300-60 MG  Please call when ready for pickup

## 2017-10-26 NOTE — Telephone Encounter (Signed)
[facility] reviewed.  Last Ty#4 filled 09/28/2017.  Will give RF to fill on or after 10/28/2017.

## 2017-10-26 NOTE — Telephone Encounter (Signed)
Contacted pt and made aware that se can pick up her rx for tylenol #4 tomorrow

## 2017-10-27 ENCOUNTER — Other Ambulatory Visit: Payer: Self-pay | Admitting: Internal Medicine

## 2017-10-27 MED ORDER — ACETAMINOPHEN-CODEINE #4 300-60 MG PO TABS: 1.0000 | ORAL_TABLET | Freq: Four times a day (QID) | ORAL | 0 refills | Status: DC | PRN

## 2017-11-14 ENCOUNTER — Other Ambulatory Visit: Payer: Self-pay | Admitting: Internal Medicine

## 2017-11-14 DIAGNOSIS — I10 Essential (primary) hypertension: Secondary | ICD-10-CM

## 2017-11-15 ENCOUNTER — Ambulatory Visit: Payer: Self-pay | Attending: Internal Medicine | Admitting: Internal Medicine

## 2017-11-15 ENCOUNTER — Encounter: Payer: Self-pay | Admitting: Internal Medicine

## 2017-11-15 VITALS — BP 166/94 | HR 75 | Temp 98.2°F | Resp 16 | Wt 169.8 lb

## 2017-11-15 DIAGNOSIS — Z808 Family history of malignant neoplasm of other organs or systems: Secondary | ICD-10-CM | POA: Insufficient documentation

## 2017-11-15 DIAGNOSIS — F17200 Nicotine dependence, unspecified, uncomplicated: Secondary | ICD-10-CM

## 2017-11-15 DIAGNOSIS — Z7951 Long term (current) use of inhaled steroids: Secondary | ICD-10-CM | POA: Insufficient documentation

## 2017-11-15 DIAGNOSIS — E119 Type 2 diabetes mellitus without complications: Secondary | ICD-10-CM | POA: Diagnosis not present

## 2017-11-15 DIAGNOSIS — Z79899 Other long term (current) drug therapy: Secondary | ICD-10-CM | POA: Insufficient documentation

## 2017-11-15 DIAGNOSIS — G8929 Other chronic pain: Secondary | ICD-10-CM | POA: Insufficient documentation

## 2017-11-15 DIAGNOSIS — Z6830 Body mass index (BMI) 30.0-30.9, adult: Secondary | ICD-10-CM | POA: Insufficient documentation

## 2017-11-15 DIAGNOSIS — K029 Dental caries, unspecified: Secondary | ICD-10-CM | POA: Insufficient documentation

## 2017-11-15 DIAGNOSIS — I10 Essential (primary) hypertension: Secondary | ICD-10-CM | POA: Insufficient documentation

## 2017-11-15 DIAGNOSIS — M545 Low back pain: Secondary | ICD-10-CM | POA: Insufficient documentation

## 2017-11-15 DIAGNOSIS — M5416 Radiculopathy, lumbar region: Secondary | ICD-10-CM | POA: Diagnosis not present

## 2017-11-15 DIAGNOSIS — D229 Melanocytic nevi, unspecified: Secondary | ICD-10-CM | POA: Insufficient documentation

## 2017-11-15 DIAGNOSIS — E669 Obesity, unspecified: Secondary | ICD-10-CM | POA: Insufficient documentation

## 2017-11-15 DIAGNOSIS — J449 Chronic obstructive pulmonary disease, unspecified: Secondary | ICD-10-CM | POA: Insufficient documentation

## 2017-11-15 DIAGNOSIS — F17210 Nicotine dependence, cigarettes, uncomplicated: Secondary | ICD-10-CM | POA: Insufficient documentation

## 2017-11-15 DIAGNOSIS — Z23 Encounter for immunization: Secondary | ICD-10-CM | POA: Insufficient documentation

## 2017-11-15 DIAGNOSIS — F329 Major depressive disorder, single episode, unspecified: Secondary | ICD-10-CM | POA: Insufficient documentation

## 2017-11-15 DIAGNOSIS — Z7984 Long term (current) use of oral hypoglycemic drugs: Secondary | ICD-10-CM | POA: Insufficient documentation

## 2017-11-15 DIAGNOSIS — E785 Hyperlipidemia, unspecified: Secondary | ICD-10-CM | POA: Insufficient documentation

## 2017-11-15 DIAGNOSIS — M542 Cervicalgia: Secondary | ICD-10-CM | POA: Insufficient documentation

## 2017-11-15 LAB — GLUCOSE, POCT (MANUAL RESULT ENTRY): POC Glucose: 128 mg/dl — AB (ref 70–99)

## 2017-11-15 MED ORDER — ACETAMINOPHEN-CODEINE #4 300-60 MG PO TABS: 1.0000 | ORAL_TABLET | Freq: Four times a day (QID) | ORAL | 0 refills | Status: DC | PRN

## 2017-11-15 MED ORDER — LISINOPRIL 5 MG PO TABS: 5.0000 mg | ORAL_TABLET | Freq: Every day | ORAL | 3 refills | Status: DC

## 2017-11-15 MED FILL — AMLODIPINE BESYLATE 10 MG T: 10 | 30 days supply | Qty: 30 | Fill #0

## 2017-11-15 MED FILL — LISINOPRIL 5 MG TAB: 5 | 30 days supply | Qty: 30 | Fill #0

## 2017-11-15 NOTE — Progress Notes (Signed)
Patient ID: Charlene Allen, female    DOB: 1955/06/05  MRN: [id number]  CC: Diabetes; Hypertension; and Dental Pain   Subjective: Charlene Allen is a 62 y.o. female who presents for chronic disease management. Her concerns today include:  Hx of chronic neck/lower back pain on Tylenol #4, Lyrica), HTN, HL, tob, obesity, COPD, depression  Noticed a itchy mole on lower back on LT side x 1 mth fhx of skin cancer in brother and mother.   Chronic neck pain:  Had c-spine fusion surgery.  Complicated by spinal fluid leakage which is resolved. Since surgery has pain and numbness lateral Rt thigh that radiates across to the lower anterior thigh. + Aching in legs.   She still has a lot of chronic pain in her lower back.  She is wanting to have an updated MRI.  She reports that Dr. [name] is willing to work with her with her lower back but has requested updated imaging Takes Ibuprofen about 6x  A day.  She is also on Tylenol No. 4's.  DM:   Checking BS once a day at nights.  Range is 140 Doing better with eating habits.  Eats very little.  Stopped all sugary drinks. Loss wgh  HTN:  Checks BP once a day.  Wrist cuff SBP 150s Limits salt Reports compliance with amlodipine and HCTZ  Tob: smoking about 4 cig/day.  She smokes more when she feels frustrated.  Ordered nicotine patches.   Patient Active Problem List   Diagnosis Date Noted  . Increased endometrial stripe thickness 09/08/2016  . Chronic neck pain 08/16/2016  . Chronic right shoulder pain 07/22/2016  . Morbid obesity due to excess calories ([place]) 06/20/2016  . COPD GOLD 0 still smoking  06/18/2016  . HPV test positive 10/13/2015  . Precordial chest pain 09/09/2015  . Mild diastolic dysfunction 16/55/3748  . Chronic diarrhea 08/13/2015  . Cigarette smoker 08/13/2015  . Chronic back pain 03/15/2013  . Essential hypertension, benign 03/15/2013  . History of substance abuse 03/15/2013     Current Outpatient Medications on  File Prior to Visit  Medication Sig Dispense Refill  . acetaminophen-codeine (TYLENOL #4) 300-60 MG tablet Take 1 tablet by mouth every 6 (six) hours as needed for moderate pain. To be filled on or after 09/28/2017. 120 tablet 0  . albuterol (PROVENTIL HFA;VENTOLIN HFA) 108 (90 Base) MCG/ACT inhaler Inhale 2 puffs into the lungs every 6 (six) hours as needed for wheezing or shortness of breath. 1 Inhaler 3  . amLODipine (NORVASC) 10 MG tablet Take 1 tablet (10 mg total) by mouth daily. 90 tablet 2  . atorvastatin (LIPITOR) 20 MG tablet Take 1 tablet (20 mg total) by mouth daily. 90 tablet 1  . Blood Glucose Monitoring Suppl (TRUE METRIX METER) w/Device KIT Use as directed 1 kit 0  . budesonide-formoterol (SYMBICORT) 80-4.5 MCG/ACT inhaler Take 2 puffs first thing in am and then another 2 puffs about 12 hours later. (Patient not taking: Reported on 08/25/2017) 1 Inhaler 11  . fluticasone (FLONASE) 50 MCG/ACT nasal spray Place 1 spray into both nostrils daily as needed for allergies or rhinitis.    Marland Kitchen glucose blood (TRUE METRIX BLOOD GLUCOSE TEST) test strip Use as instructed 100 each 12  . hydrochlorothiazide (HYDRODIURIL) 25 MG tablet Take 1 tablet (25 mg total) by mouth daily. 30 tablet 6  . metFORMIN (GLUCOPHAGE) 500 MG tablet Take 1 tablet (500 mg total) by mouth 2 (two) times daily with a meal. 60 tablet 3  .  methocarbamol (ROBAXIN) 500 MG tablet Take 1 tablet (500 mg total) by mouth every 6 (six) hours as needed for muscle spasms. 60 tablet 0  . nicotine (NICODERM CQ - DOSED IN MG/24 HOURS) 14 mg/24hr patch Place 1 patch (14 mg total) onto the skin daily. 28 patch 0  . nitroGLYCERIN (NITROSTAT) 0.4 MG SL tablet Place 1 tablet (0.4 mg total) under the tongue every 5 (five) minutes as needed for chest pain. If you need more than 2 doses call 911 20 tablet 1  . nitroGLYCERIN (NITROSTAT) 0.4 MG SL tablet Place 1 tablet (0.4 mg total) under the tongue every 5 (five) minutes as needed for chest pain.  12    . potassium chloride SA (K-DUR,KLOR-CON) 20 MEQ tablet TAKE ONE TABLET BY MOUTH EVERY DAY 30 tablet 6  . pregabalin (LYRICA) 75 MG capsule Take 1 capsule (75 mg total) by mouth 3 (three) times daily. For PASS (Patient taking differently: Take 75 mg by mouth daily. For PASS) 270 capsule 2  . TRUEPLUS LANCETS 28G MISC Use as directed 100 each 12   No current facility-administered medications on file prior to visit.     No Known Allergies  Social History   Socioeconomic History  . Marital status: Married    Spouse name: Not on file  . Number of children: Not on file  . Years of education: Not on file  . Highest education level: Not on file  Occupational History  . Not on file  Social Needs  . Financial resource strain: Not on file  . Food insecurity:    Worry: Not on file    Inability: Not on file  . Transportation needs:    Medical: Not on file    Non-medical: Not on file  Tobacco Use  . Smoking status: Current Every Day Smoker    Packs/day: 0.50    Years: 40.00    Pack years: 20.00    Types: Cigarettes  . Smokeless tobacco: Never Used  Substance and Sexual Activity  . Alcohol use: No    Alcohol/week: 0.0 standard drinks  . Drug use: No  . Sexual activity: Not Currently    Birth control/protection: None  Lifestyle  . Physical activity:    Days per week: Not on file    Minutes per session: Not on file  . Stress: Not on file  Relationships  . Social connections:    Talks on phone: Not on file    Gets together: Not on file    Attends religious service: Not on file    Active member of club or organization: Not on file    Attends meetings of clubs or organizations: Not on file    Relationship status: Not on file  . Intimate partner violence:    Fear of current or ex partner: Not on file    Emotionally abused: Not on file    Physically abused: Not on file    Forced sexual activity: Not on file  Other Topics Concern  . Not on file  Social History Narrative  . Not  on file    Family History  Problem Relation Age of Onset  . COPD Mother   . Stroke Mother   . Heart disease Mother   . Hypertension Mother   . Cancer Sister 25       cervical cancer   . Stroke Maternal Grandmother     Past Surgical History:  Procedure Laterality Date  . ABDOMINAL AORTOGRAM N/A 07/12/2016   Procedure:  Abdominal Aortogram;  Surgeon: Jettie Booze, MD;  Location: [facility];  Service: Cardiovascular;  Laterality: N/A;  . ANTERIOR CERVICAL DECOMP/DISCECTOMY FUSION N/A 09/05/2017   Procedure: C4-5, C5-6 ANTERIOR CERVICAL DECOMPRESSION/DISCECTOMY FUSION, ALLOGRAFT, PLATE;  Surgeon: Marybelle Killings, MD;  Location: [facility];  Service: Orthopedics;  Laterality: N/A;  . HAND SURGERY    . LEFT HEART CATH AND CORONARY ANGIOGRAPHY N/A 07/12/2016   Procedure: Left Heart Cath and Coronary Angiography;  Surgeon: Jettie Booze, MD;  Location: [facility];  Service: Cardiovascular;  Laterality: N/A;  . TUBAL LIGATION      ROS: Review of Systems Complain of toothache in the left lower jaw that started a few days ago.  She has decayed teeth that are broken off in the jaw on that side.  She has spoken with her dentist this morning and they are willing to see her today  PHYSICAL EXAM: BP (!) 166/94   Pulse 75   Temp 98.2 F (36.8 C) (Oral)   Resp 16   Wt 169 lb 12.8 oz (77 kg)   SpO2 97%   BMI 30.08 kg/m   Physical Exam  General appearance - alert, well appearing older Caucasian female.  She appears uncomfortable due to dental pain Mental status -anxious mood.   Mouth -mild swelling along the left lower jawline.  She has second and third molar on this side that are broken off in the gum.  Mild swelling of the gum.   Neck -no cervical lymphadenopathy.  No thyromegaly. Chest - clear to auscultation, no wheezes, rales or rhonchi, symmetric air entry Heart - normal rate, regular rhythm, normal S1, S2, no murmurs, rubs, clicks or gallops Musculoskeletal -no  tenderness on palpation of the lumbar spine.  Gross sensation in the right thigh anterior and laterally are decreased compared to the left.  Power right leg 4/5 proximal and distal.  Left leg 5/5 proximal and distal.  She walks unassisted. Extremities -no lower extremity edema Skin: About a 2 cm raised clear mole noted on the lower back left side close to the midline  ASSESSMENT AND PLAN: 1. Essential hypertension Not at goal.  Add lisinopril.  Encouraged to limit salt in the foods. - lisinopril (PRINIVIL,ZESTRIL) 5 MG tablet; Take 1 tablet (5 mg total) by mouth daily.  Dispense: 90 tablet; Refill: 3  2. Tobacco use disorder Advised to quit.  She is aware of the health risks associated with continued smoking.  She plans to start using the patches when she is mentally ready  3. Need for immunization against influenza - Flu Vaccine QUAD 36+ mos IM  4. Lumbar radiculopathy, chronic We will check with radiology to see whether we can order an MRI of the lumbar spine given the surgery she had on her neck recently with placement of plates.  If we cannot do MRI we will do a CAT scan Controlled substance prescribing agreement was updated 06/2017. - [id number] 11+Oxyco+Alc+Crt-Bund - acetaminophen-codeine (TYLENOL #4) 300-60 MG tablet; Take 1 tablet by mouth every 6 (six) hours as needed for moderate pain (to fill on 11/27/2017).  Dispense: 120 tablet; Refill: 0  5. Controlled type 2 diabetes mellitus without complication, without long-term current use of insulin ([place]) Controlled based on reported blood sugars.  Advised to continue healthy eating habits.  Continue metformin. - POCT glucose (manual entry) - Microalbumin / creatinine urine ratio  6. Fleshy skin mole- Ambulatory referral to Dermatology  7. Pain due to dental caries Patient to  keep dental appointment today   Patient was given the opportunity to ask questions.  Patient verbalized understanding of the plan and was able to repeat key  elements of the plan.   Orders Placed This Encounter  Procedures  . Flu Vaccine QUAD 36+ mos IM  . Microalbumin / creatinine urine ratio  . [id number] 11+Oxyco+Alc+Crt-Bund  . Ambulatory referral to Dermatology  . POCT glucose (manual entry)     Requested Prescriptions   Signed Prescriptions Disp Refills  . lisinopril (PRINIVIL,ZESTRIL) 5 MG tablet 90 tablet 3    Sig: Take 1 tablet (5 mg total) by mouth daily.  Marland Kitchen acetaminophen-codeine (TYLENOL #4) 300-60 MG tablet 120 tablet 0    Sig: Take 1 tablet by mouth every 6 (six) hours as needed for moderate pain (to fill on 11/27/2017).    Return in about 3 months (around 02/15/2018).  Karle Plumber, MD, FACP

## 2017-11-15 NOTE — Patient Instructions (Addendum)
Your blood pressure is not well controlled.  We have added a blood pressure medication called lisinopril 5 mg daily.  Cut back on taking the ibuprofen as this can increase blood pressure.    Influenza Virus Vaccine injection (Fluarix) What is this medicine? INFLUENZA VIRUS VACCINE (in floo EN zuh VAHY ruhs vak SEEN) helps to reduce the risk of getting influenza also known as the flu. This medicine may be used for other purposes; ask your health care provider or pharmacist if you have questions. COMMON BRAND NAME(S): Fluarix, Fluzone What should I tell my health care provider before I take this medicine? They need to know if you have any of these conditions: -bleeding disorder like hemophilia -fever or infection -Guillain-Barre syndrome or other neurological problems -immune system problems -infection with the human immunodeficiency virus (HIV) or AIDS -low blood platelet counts -multiple sclerosis -an unusual or allergic reaction to influenza virus vaccine, eggs, chicken proteins, latex, gentamicin, other medicines, foods, dyes or preservatives -pregnant or trying to get pregnant -breast-feeding How should I use this medicine? This vaccine is for injection into a muscle. It is given by a health care professional. A copy of Vaccine Information Statements will be given before each vaccination. Read this sheet carefully each time. The sheet may change frequently. Talk to your pediatrician regarding the use of this medicine in children. Special care may be needed. Overdosage: If you think you have taken too much of this medicine contact a poison control center or emergency room at once. NOTE: This medicine is only for you. Do not share this medicine with others. What if I miss a dose? This does not apply. What may interact with this medicine? -chemotherapy or radiation therapy -medicines that lower your immune system like etanercept, anakinra, infliximab, and adalimumab -medicines that  treat or prevent blood clots like warfarin -phenytoin -steroid medicines like prednisone or cortisone -theophylline -vaccines This list may not describe all possible interactions. Give your health care provider a list of all the medicines, herbs, non-prescription drugs, or dietary supplements you use. Also tell them if you smoke, drink alcohol, or use illegal drugs. Some items may interact with your medicine. What should I watch for while using this medicine? Report any side effects that do not go away within 3 days to your doctor or health care professional. Call your health care provider if any unusual symptoms occur within 6 weeks of receiving this vaccine. You may still catch the flu, but the illness is not usually as bad. You cannot get the flu from the vaccine. The vaccine will not protect against colds or other illnesses that may cause fever. The vaccine is needed every year. What side effects may I notice from receiving this medicine? Side effects that you should report to your doctor or health care professional as soon as possible: -allergic reactions like skin rash, itching or hives, swelling of the face, lips, or tongue Side effects that usually do not require medical attention (report to your doctor or health care professional if they continue or are bothersome): -fever -headache -muscle aches and pains -pain, tenderness, redness, or swelling at site where injected -weak or tired This list may not describe all possible side effects. Call your doctor for medical advice about side effects. You may report side effects to FDA at 1-800-FDA-1088. Where should I keep my medicine? This vaccine is only given in a clinic, pharmacy, doctor's office, or other health care setting and will not be stored at home. NOTE: This sheet is  a summary. It may not cover all possible information. If you have questions about this medicine, talk to your doctor, pharmacist, or health care provider.  2018  Elsevier/Gold Standard (2007-10-11 09:30:40)

## 2017-11-15 NOTE — Progress Notes (Signed)
cbg 128 

## 2017-11-16 ENCOUNTER — Telehealth: Payer: Self-pay

## 2017-11-16 LAB — MICROALBUMIN / CREATININE URINE RATIO
CREATININE, UR: 22.1 mg/dL
Microalb/Creat Ratio: 52.5 mg/g creat — ABNORMAL HIGH (ref 0.0–30.0)
Microalbumin, Urine: 11.6 ug/mL

## 2017-11-16 MED FILL — POTASSIUM CL ER 20 MEQ TAB: 20 | 30 days supply | Qty: 30 | Fill #0

## 2017-11-16 NOTE — Telephone Encounter (Signed)
Contacted pt to go over urine results pt didn't answer left a detailed vm informing pt of results and if she has any questions or concerns to give me a call   If pt calls back please give results: has small amount of protein in the urine which can be an indication of diabetes affecting the kidneys if it persists. The new BP med Lisinopril will help to protect the kidneys.

## 2017-11-20 LAB — DRUG SCREEN 764883 11+OXYCO+ALC+CRT-BUND
AMPHETAMINES, URINE: NEGATIVE ng/mL
BARBITURATE: NEGATIVE ng/mL
BENZODIAZ UR QL: NEGATIVE ng/mL
COCAINE (METABOLITE): NEGATIVE ng/mL
Cannabinoid Quant, Ur: NEGATIVE ng/mL
Creatinine: 21.6 mg/dL (ref 20.0–300.0)
ETHANOL: NEGATIVE %
Meperidine: NEGATIVE ng/mL
Methadone Screen, Urine: NEGATIVE ng/mL
Oxycodone/Oxymorphone, Urine: NEGATIVE ng/mL
PH OF URINE: 6.4 (ref 4.5–8.9)
Phencyclidine: NEGATIVE ng/mL
Propoxyphene: NEGATIVE ng/mL
TRAMADOL: NEGATIVE ng/mL

## 2017-11-20 LAB — OPIATES CONFIRMATION, URINE
CODEINE: POSITIVE — AB
Codeine Confirm: >3000 ng/mL
HYDROMORPHONE: NEGATIVE
Hydrocodone: NEGATIVE
Morphine Confirm: 1908 ng/mL
Morphine: POSITIVE — AB
Opiates: POSITIVE ng/mL — AB

## 2017-11-21 ENCOUNTER — Telehealth: Payer: Self-pay | Admitting: Internal Medicine

## 2017-11-21 NOTE — Telephone Encounter (Signed)
Returned pt call and pt states she is having nausea, some dizzy spells. Pt states the symptoms started when she started the lisinopril.   Pt states her neck has started hurting for 3 days now and she is needing a refill on the robaxin. Pt states she has made an appointment with ortho

## 2017-11-21 NOTE — Telephone Encounter (Signed)
Patient called requesting for provider to reach out to her because she believes lisinopril medication is making her sick. Patient also requesting refills of methocarbamol (ROBAXIN) 500 MG tablet [[id number]]  Please follow up with patient.

## 2017-11-22 MED ORDER — LOSARTAN POTASSIUM 25 MG PO TABS: 25.0000 mg | ORAL_TABLET | Freq: Every day | ORAL | 6 refills | Status: DC

## 2017-11-22 MED ORDER — METHOCARBAMOL 500 MG PO TABS: 500.0000 mg | ORAL_TABLET | Freq: Three times a day (TID) | ORAL | 0 refills | Status: DC | PRN

## 2017-11-22 MED FILL — METHOCARBAMOL 500 MG TABS: 500 | 13 days supply | Qty: 40 | Fill #0

## 2017-11-22 NOTE — Telephone Encounter (Signed)
RX was sent earlier this am by Dr. Wynetta Emery

## 2017-11-22 NOTE — Telephone Encounter (Signed)
Contacted pt to go over Dr. Wynetta Emery response pt didn't answer left a detailed vm informing pt of this information and if she has any questions or concerns to give me a call

## 2017-11-22 NOTE — Telephone Encounter (Signed)
Patient called requesting for the following meds be sent to [facility] pharmacy  losartan (COZAAR) 25 MG tablet methocarbamol (ROBAXIN) 500 MG tablet

## 2017-11-23 ENCOUNTER — Telehealth (INDEPENDENT_AMBULATORY_CARE_PROVIDER_SITE_OTHER): Payer: Self-pay | Admitting: Radiology

## 2017-11-23 ENCOUNTER — Encounter (INDEPENDENT_AMBULATORY_CARE_PROVIDER_SITE_OTHER): Payer: Self-pay | Admitting: Orthopaedic Surgery

## 2017-11-23 ENCOUNTER — Ambulatory Visit (INDEPENDENT_AMBULATORY_CARE_PROVIDER_SITE_OTHER): Payer: Self-pay | Admitting: Orthopaedic Surgery

## 2017-11-23 ENCOUNTER — Ambulatory Visit (INDEPENDENT_AMBULATORY_CARE_PROVIDER_SITE_OTHER): Payer: Self-pay

## 2017-11-23 VITALS — BP 166/85 | HR 72 | Ht 63.0 in | Wt 160.0 lb

## 2017-11-23 DIAGNOSIS — M542 Cervicalgia: Secondary | ICD-10-CM

## 2017-11-23 MED ORDER — PREDNISONE 5 MG PO TABS: 5.0000 mg | ORAL_TABLET | Freq: Every day | ORAL | 0 refills | Status: DC

## 2017-11-23 NOTE — Telephone Encounter (Signed)
Patient called back appt made

## 2017-11-23 NOTE — Progress Notes (Signed)
Post-Op Visit Note   Patient: Charlene Allen           Date of Birth: 11-07-1955           MRN: [id number] Visit Date: 11/23/2017 PCP: Ladell Pier, MD   Assessment & Plan: Post 2 level cervical fusion C4-5 C5-6.  Patient last week suddenly had significant pain in her neck that radiates into her right shoulder.  She states the pain is been severe.  She had Tylenol No. 4 and also ibuprofen.  X-rays today demonstrate some motion at C5-6 suggesting delayed incorporation of the graft at the inferior aspect.  She appears to have some slight lucency around the screws in C6.  We will place her back in a new soft cervical collar, collar coverage given.  Prednisone 5 mg number 10 tablets 1 p.o. daily prescribed.  Last A1c was 6.5.  She has had some CBG's in the 120s.  We will check her back again in 4 weeks.  We discussed that if the graft does not finish up incorporating and she still symptomatic she might require posterior cervical fusion.  Recheck 1 month with lateral flexion-extension x-ray cervical spine.  Chief Complaint:  Chief Complaint  Patient presents with  . Neck - Pain   Visit Diagnoses:  1. Neck pain     Plan: Recheck 1 month x-rays as above.  Follow-Up Instructions: No follow-ups on file.   Orders:  Orders Placed This Encounter  Procedures  . XR Cervical Spine 2 or 3 views   No orders of the defined types were placed in this encounter.   Imaging: Xr Cervical Spine 2 Or 3 Views  Result Date: 11/23/2017 AP lateral and lateral flexion-extension C-spine x-rays obtained.  This shows previous cervical fusion C4-5 C5-6.  There is trace motion at C5-6 with flexion and extension and slight loosening around the screws in C6. Impression: Post 2 level cervical fusion with delayed incorporation of the graft at C5-6.   PMFS History: Patient Active Problem List   Diagnosis Date Noted  . Controlled type 2 diabetes mellitus without complication, without long-term current use  of insulin ([place]) 11/15/2017  . Lumbar radiculopathy, chronic 11/15/2017  . Increased endometrial stripe thickness 09/08/2016  . Chronic neck pain 08/16/2016  . Chronic right shoulder pain 07/22/2016  . Morbid obesity due to excess calories ([place]) 06/20/2016  . COPD GOLD 0 still smoking  06/18/2016  . HPV test positive 10/13/2015  . Mild diastolic dysfunction 73/22/0254  . Chronic diarrhea 08/13/2015  . Cigarette smoker 08/13/2015  . Chronic back pain 03/15/2013  . Essential hypertension, benign 03/15/2013  . History of substance abuse 03/15/2013   Past Medical History:  Diagnosis Date  . Diabetes mellitus without complication ([place])   . Hypertension Dx 2015  . Obesity   . PMB (postmenopausal bleeding)     Family History  Problem Relation Age of Onset  . COPD Mother   . Stroke Mother   . Heart disease Mother   . Hypertension Mother   . Cancer Sister 25       cervical cancer   . Stroke Maternal Grandmother     Past Surgical History:  Procedure Laterality Date  . ABDOMINAL AORTOGRAM N/A 07/12/2016   Procedure: Abdominal Aortogram;  Surgeon: Jettie Booze, MD;  Location: [facility];  Service: Cardiovascular;  Laterality: N/A;  . ANTERIOR CERVICAL DECOMP/DISCECTOMY FUSION N/A 09/05/2017   Procedure: C4-5, C5-6 ANTERIOR CERVICAL DECOMPRESSION/DISCECTOMY FUSION, ALLOGRAFT, PLATE;  Surgeon: Rodell Perna  C, MD;  Location: [facility];  Service: Orthopedics;  Laterality: N/A;  . HAND SURGERY    . LEFT HEART CATH AND CORONARY ANGIOGRAPHY N/A 07/12/2016   Procedure: Left Heart Cath and Coronary Angiography;  Surgeon: Jettie Booze, MD;  Location: [facility];  Service: Cardiovascular;  Laterality: N/A;  . TUBAL LIGATION     Social History   Occupational History  . Not on file  Tobacco Use  . Smoking status: Current Every Day Smoker    Packs/day: 0.50    Years: 40.00    Pack years: 20.00    Types: Cigarettes  . Smokeless tobacco: Never Used  Substance and  Sexual Activity  . Alcohol use: No    Alcohol/week: 0.0 standard drinks  . Drug use: No  . Sexual activity: Not Currently    Birth control/protection: None

## 2017-11-23 NOTE — Telephone Encounter (Signed)
Message left on triage voice mail that patient needs an appt asap today, she is in severe pain with her neck, wants to see [name] today, IC LMVM for her to call me to give her a workin appt.

## 2017-11-23 NOTE — Addendum Note (Signed)
Addended by: Meyer Cory on: 11/23/2017 10:04 AM   Modules accepted: Orders

## 2017-11-24 MED FILL — LOSARTAN POTASSIUM 25 MG TA: 25 | 30 days supply | Qty: 30 | Fill #0

## 2017-11-25 MED FILL — ?METFORMIN HCL 500MG TABS: 500 | 30 days supply | Qty: 60 | Fill #1

## 2017-11-29 NOTE — Progress Notes (Signed)
Patient returns.  She is a few weeks status post C4-5 and C5-6 ACDF.  This appointment was scheduled due to right lateral hip pain from greater trochanteric bursitis.  Plan was for patient to come in and have her blood sugar checked to see if I could perform injection today.  States that right hip pain is about the same.  She decided that she does not want her hip injected as previously discussed last office visit.  She can use ice off and on as needed.  She asked if she can remove her cervical collar and I advised her this must remain until return office visit with Dr. [name] at 6 weeks postop.  All questions answered.

## 2017-11-30 ENCOUNTER — Ambulatory Visit (INDEPENDENT_AMBULATORY_CARE_PROVIDER_SITE_OTHER): Payer: Self-pay | Admitting: Orthopaedic Surgery

## 2017-12-19 MED FILL — AMLODIPINE BESYLATE 10 MG T: 10 | 30 days supply | Qty: 30 | Fill #1

## 2017-12-19 MED FILL — POTASSIUM CL ER 20 MEQ TAB: 20 | 30 days supply | Qty: 30 | Fill #1

## 2017-12-21 ENCOUNTER — Ambulatory Visit (INDEPENDENT_AMBULATORY_CARE_PROVIDER_SITE_OTHER): Payer: Self-pay

## 2017-12-21 ENCOUNTER — Encounter (INDEPENDENT_AMBULATORY_CARE_PROVIDER_SITE_OTHER): Payer: Self-pay | Admitting: Orthopaedic Surgery

## 2017-12-21 ENCOUNTER — Ambulatory Visit (INDEPENDENT_AMBULATORY_CARE_PROVIDER_SITE_OTHER): Payer: Self-pay | Admitting: Orthopaedic Surgery

## 2017-12-21 VITALS — BP 133/81 | HR 64 | Ht 63.0 in | Wt 160.0 lb

## 2017-12-21 DIAGNOSIS — Z981 Arthrodesis status: Secondary | ICD-10-CM

## 2017-12-21 NOTE — Progress Notes (Signed)
Office Visit Note   Patient: Charlene Allen           Date of Birth: 1955/11/14           MRN: [id number] Visit Date: 12/21/2017              Requested by: Ladell Pier, MD [address] [place], [place] [address] PCP: Ladell Pier, MD   Assessment & Plan: Visit Diagnoses:  1. Status post cervical spinal fusion     Plan: Patient can discontinue collar.  She is happy with the surgical result from her cervical fusion.  I discussed with her that the cervical plate will not stop her from getting any MRI imaging studies.  She can return to see me on a as needed basis.  Follow-Up Instructions: No follow-ups on file.   Orders:  Orders Placed This Encounter  Procedures  . XR Cervical Spine 2 or 3 views   No orders of the defined types were placed in this encounter.     Procedures: No procedures performed   Clinical Data: No additional findings.   Subjective: Chief Complaint  Patient presents with  . Neck - Follow-up    09/05/17 C4-5, C5-6 ACDF, Allograft, Plate    HPI 62 year old female returns post 2 level cervical fusion she had delayed incorporation of her grafts and additional time collar was recommended.  She kept a collar on states her neck is gotten better.  She denies numbness or tingling in her hands.  She had a recent flare had some prednisone which helped her symptoms.  She is used some Tylenol No. 4 for relief.  She states she is actually having more problems with her lower back which is been bothering her for a while worse on the right lower back and left.  Last MRI was 2007 which showed some facet arthropathy at 3445 grade 1 anterolisthesis at L4-5 without pars defect.  Patient states that her PCP is going to order lumbar MRI for her.  Review of Systems Updated unchanged since surgery other than as mentioned in HPI.  14 point systems updated.  Objective: Vital Signs: BP 133/81   Pulse 64   Ht 5\' 3"  (1.6 m)   Wt 160 lb (72.6 kg)   BMI 28.34  kg/m   Physical Exam  Constitutional: She is oriented to person, place, and time. She appears well-developed.  HENT:  Head: Normocephalic.  Right Ear: External ear normal.  Left Ear: External ear normal.  Eyes: Pupils are equal, round, and reactive to light.  Neck: No tracheal deviation present. No thyromegaly present.  Cardiovascular: Normal rate.  Pulmonary/Chest: Effort normal.  Abdominal: Soft.  Neurological: She is alert and oriented to person, place, and time.  Skin: Skin is warm and dry.  Psychiatric: She has a normal mood and affect. Her behavior is normal.    Ortho Exam intact reflexes well-healed anterior cervical incision.  No pain with cervical rotation flexion extension.  Specialty Comments:  No specialty comments available.  Imaging: No results found.   PMFS History: Patient Active Problem List   Diagnosis Date Noted  . Controlled type 2 diabetes mellitus without complication, without long-term current use of insulin ([place]) 11/15/2017  . Lumbar radiculopathy, chronic 11/15/2017  . Increased endometrial stripe thickness 09/08/2016  . Chronic neck pain 08/16/2016  . Chronic right shoulder pain 07/22/2016  . Morbid obesity due to excess calories ([place]) 06/20/2016  . COPD GOLD 0 still smoking  06/18/2016  . HPV test  positive 10/13/2015  . Mild diastolic dysfunction 23/53/6144  . Chronic diarrhea 08/13/2015  . Cigarette smoker 08/13/2015  . Chronic back pain 03/15/2013  . Essential hypertension, benign 03/15/2013  . History of substance abuse 03/15/2013   Past Medical History:  Diagnosis Date  . Diabetes mellitus without complication ([place])   . Hypertension Dx 2015  . Obesity   . PMB (postmenopausal bleeding)     Family History  Problem Relation Age of Onset  . COPD Mother   . Stroke Mother   . Heart disease Mother   . Hypertension Mother   . Cancer Sister 25       cervical cancer   . Stroke Maternal Grandmother     Past Surgical History:    Procedure Laterality Date  . ABDOMINAL AORTOGRAM N/A 07/12/2016   Procedure: Abdominal Aortogram;  Surgeon: Jettie Booze, MD;  Location: [facility];  Service: Cardiovascular;  Laterality: N/A;  . ANTERIOR CERVICAL DECOMP/DISCECTOMY FUSION N/A 09/05/2017   Procedure: C4-5, C5-6 ANTERIOR CERVICAL DECOMPRESSION/DISCECTOMY FUSION, ALLOGRAFT, PLATE;  Surgeon: Marybelle Killings, MD;  Location: [facility];  Service: Orthopedics;  Laterality: N/A;  . HAND SURGERY    . LEFT HEART CATH AND CORONARY ANGIOGRAPHY N/A 07/12/2016   Procedure: Left Heart Cath and Coronary Angiography;  Surgeon: Jettie Booze, MD;  Location: [facility];  Service: Cardiovascular;  Laterality: N/A;  . TUBAL LIGATION     Social History   Occupational History  . Not on file  Tobacco Use  . Smoking status: Current Every Day Smoker    Packs/day: 0.50    Years: 40.00    Pack years: 20.00    Types: Cigarettes  . Smokeless tobacco: Never Used  Substance and Sexual Activity  . Alcohol use: No    Alcohol/week: 0.0 standard drinks  . Drug use: No  . Sexual activity: Not Currently    Birth control/protection: None

## 2017-12-27 ENCOUNTER — Telehealth: Payer: Self-pay | Admitting: Internal Medicine

## 2017-12-27 DIAGNOSIS — M5416 Radiculopathy, lumbar region: Secondary | ICD-10-CM

## 2017-12-27 NOTE — Telephone Encounter (Signed)
Will forward to covering provider   Pt states she always get a hard copy

## 2017-12-27 NOTE — Telephone Encounter (Signed)
1) Medication(s) Requested (by name): Tylenol 4 2) Pharmacy of Choice:  3) Special Requests:   Approved medications will be sent to the pharmacy, we will reach out if there is an issue.  Requests made after 3pm may not be addressed until the following business day!  If a patient is unsure of the name of the medication(s) please note and ask patient to call back when they are able to provide all info, do not send to responsible party until all information is available!

## 2017-12-28 MED ORDER — ACETAMINOPHEN-CODEINE #4 300-60 MG PO TABS: 1.0000 | ORAL_TABLET | Freq: Four times a day (QID) | ORAL | 0 refills | Status: DC | PRN

## 2017-12-28 NOTE — Telephone Encounter (Signed)
Done

## 2017-12-29 NOTE — Telephone Encounter (Signed)
Patient has been called and a voicemail was left informing patient that script is ready for pick up. Script will be upfront in log book.

## 2018-01-10 MED FILL — LOSARTAN POTASSIUM 25 MG TA: 25 | 30 days supply | Qty: 30 | Fill #1

## 2018-01-10 MED FILL — metFORMIN HCL 500 MG TABS: 500 | 30 days supply | Qty: 60 | Fill #2

## 2018-01-23 MED FILL — AMLODIPINE BESYLATE 10 MG T: 10 | 30 days supply | Qty: 30 | Fill #2

## 2018-01-25 ENCOUNTER — Telehealth: Payer: Self-pay | Admitting: Internal Medicine

## 2018-01-25 DIAGNOSIS — M5416 Radiculopathy, lumbar region: Secondary | ICD-10-CM

## 2018-01-25 NOTE — Telephone Encounter (Signed)
Contacted pt to inform her that rx is ready and she will need to schedule an appointment before any further refills.    Dr. Wynetta Emery pt states she will like to wait to get her MRI done until she renews her orange card

## 2018-01-25 NOTE — Telephone Encounter (Signed)
Patient called requesting a refill on Tylenol # 4. Please f/u with patient.

## 2018-01-26 MED ORDER — ACETAMINOPHEN-CODEINE #4 300-60 MG PO TABS: 1.0000 | ORAL_TABLET | Freq: Four times a day (QID) | ORAL | 0 refills | Status: DC | PRN

## 2018-01-26 NOTE — Telephone Encounter (Signed)
NCCSRS reviewed and is appropriate.  Rf given on Tyl#4 1 tab PO Q4 6 hrs #120.

## 2018-01-27 ENCOUNTER — Other Ambulatory Visit: Payer: Self-pay

## 2018-01-27 DIAGNOSIS — M5412 Radiculopathy, cervical region: Secondary | ICD-10-CM

## 2018-01-27 MED ORDER — PREGABALIN 75 MG PO CAPS: 75.0000 mg | ORAL_CAPSULE | Freq: Three times a day (TID) | ORAL | 1 refills | Status: DC

## 2018-01-27 MED ORDER — ACETAMINOPHEN-CODEINE #4 300-60 MG PO TABS: 1.0000 | ORAL_TABLET | Freq: Four times a day (QID) | ORAL | 0 refills | Status: DC | PRN

## 2018-02-07 MED FILL — LOSARTAN POTASSIUM 25 MG TA: 25 | 30 days supply | Qty: 30 | Fill #2

## 2018-02-13 ENCOUNTER — Other Ambulatory Visit: Payer: Self-pay | Admitting: Internal Medicine

## 2018-02-13 DIAGNOSIS — I10 Essential (primary) hypertension: Secondary | ICD-10-CM

## 2018-02-14 MED FILL — metFORMIN HCL 500 MG TABS: 500 | 30 days supply | Qty: 60 | Fill #3

## 2018-02-20 MED FILL — AMLODIPINE BESYLATE 10 MG T: 10 | 30 days supply | Qty: 30 | Fill #0

## 2018-02-27 ENCOUNTER — Telehealth: Payer: Self-pay | Admitting: Internal Medicine

## 2018-02-27 DIAGNOSIS — M5416 Radiculopathy, lumbar region: Secondary | ICD-10-CM

## 2018-02-27 NOTE — Telephone Encounter (Signed)
1) Medication(s) Requested (by name): Tylenol 4 2) Pharmacy of Choice:  [place], [place], [place]

## 2018-02-27 NOTE — Telephone Encounter (Signed)
Will route to PCP for approval.

## 2018-02-28 MED ORDER — ACETAMINOPHEN-CODEINE #4 300-60 MG PO TABS: 1.0000 | ORAL_TABLET | Freq: Four times a day (QID) | ORAL | 0 refills | Status: DC | PRN

## 2018-02-28 NOTE — Telephone Encounter (Signed)
Refill given on Tylenol No. 4.  Patient has follow-up appointment with me later this month.

## 2018-02-28 NOTE — Telephone Encounter (Signed)
Contacted pt and made aware  

## 2018-02-28 NOTE — Addendum Note (Signed)
Addended by: Karle Plumber B on: 02/28/2018 08:21 AM   Modules accepted: Orders

## 2018-03-02 ENCOUNTER — Telehealth: Payer: Self-pay | Admitting: Internal Medicine

## 2018-03-02 NOTE — Telephone Encounter (Signed)
Patient came in person to get insurance coverage updated and would like for her MRI to be scheduled since she now has medicaid.Please follow up.

## 2018-03-03 NOTE — Telephone Encounter (Signed)
Went on the [employer] and did prior auth for MRI   [phone] MRI Lumbar Spine, (spinal canal and contents); without contrast material  ICD- M54.16 RADICULOPATHY, LUMBAR REGION  Authorization #[id number] Effective- 03/03/2018 Expires-04/02/2018  Contacted the scheduling department and spoke to [facility] and scheduled MRI.  Pt is scheduled for her MRI on Decmeber 15, 2019 @9am  @[facility]. Pt is to arrive @830am . Pt will need to go throught the emergency room dept to get to radiology because it is the weekend.  Provided [place] authorization #  Contacted pt and provided MRI appointment information. Pt doesn't have any questions or concerns

## 2018-03-08 ENCOUNTER — Other Ambulatory Visit: Payer: Self-pay | Admitting: Internal Medicine

## 2018-03-08 ENCOUNTER — Ambulatory Visit (INDEPENDENT_AMBULATORY_CARE_PROVIDER_SITE_OTHER): Payer: Self-pay | Admitting: Orthopaedic Surgery

## 2018-03-08 DIAGNOSIS — I10 Essential (primary) hypertension: Secondary | ICD-10-CM

## 2018-03-12 ENCOUNTER — Ambulatory Visit (HOSPITAL_COMMUNITY)
Admission: RE | Admit: 2018-03-12 | Discharge: 2018-03-12 | Disposition: A | Discharge status: Home / Self Care | Payer: Medicaid Other | Source: Ambulatory Visit | Attending: Internal Medicine | Admitting: Internal Medicine

## 2018-03-12 DIAGNOSIS — M545 Low back pain: Secondary | ICD-10-CM | POA: Diagnosis not present

## 2018-03-12 DIAGNOSIS — M5416 Radiculopathy, lumbar region: Secondary | ICD-10-CM | POA: Diagnosis present

## 2018-03-12 DIAGNOSIS — M47816 Spondylosis without myelopathy or radiculopathy, lumbar region: Secondary | ICD-10-CM | POA: Diagnosis not present

## 2018-03-12 DIAGNOSIS — M4316 Spondylolisthesis, lumbar region: Secondary | ICD-10-CM | POA: Insufficient documentation

## 2018-03-14 NOTE — Telephone Encounter (Signed)
Diane with [facility] called requesting for patient PCP to look over pt MR Lumbar Spine WO Contrast. Diane was informed that the report was in Epic and Dr. Wynetta Emery would take a look at it at her earliest convenience. Please f/u.

## 2018-03-16 ENCOUNTER — Ambulatory Visit: Payer: Medicaid Other | Admitting: Internal Medicine

## 2018-03-17 ENCOUNTER — Telehealth: Payer: Self-pay | Admitting: Internal Medicine

## 2018-03-17 NOTE — Telephone Encounter (Signed)
Patient called and said she doesn't feel well that her poop is green. She doesn't feel well and wants something called in.

## 2018-03-20 NOTE — Telephone Encounter (Signed)
Please schedule pt an uc visit or with any provider that has an opening   Contacted pt to go over MRI results pt didn't answer lvm

## 2018-03-30 ENCOUNTER — Telehealth: Payer: Self-pay | Admitting: Internal Medicine

## 2018-03-30 NOTE — Telephone Encounter (Signed)
.  1) Medication(s) Requested (by name): -acetaminophen-codeine (TYLENOL #4) 300-60 MG tablet  2) Pharmacy of Choice: -[place], [place], [place] 3) Special Requests:   Approved medications will be sent to the pharmacy, we will reach out if there is an issue.  Requests made after 3pm may not be addressed until the following business day!  If a patient is unsure of the name of the medication(s) please note and ask patient to call back when they are able to provide all info, do not send to responsible party until all information is available!

## 2018-03-31 ENCOUNTER — Telehealth: Payer: Self-pay | Admitting: Internal Medicine

## 2018-03-31 ENCOUNTER — Other Ambulatory Visit: Payer: Self-pay | Admitting: Internal Medicine

## 2018-03-31 NOTE — Telephone Encounter (Signed)
1) Medication(s) Requested (by name): -acetaminophen-codeine (TYLENOL #4) 300-60 MG tablet  2) Pharmacy of Choice: -[place], [place], [place]

## 2018-04-01 NOTE — Telephone Encounter (Signed)
Request already sent to provider, we are pending her decision.

## 2018-04-01 NOTE — Telephone Encounter (Signed)
Pt last seen: 11/15/17 Next appt: 04/25/18 Last RX written on: 03/01/18 Date of original fill: 03/07/18 Date of refill(s): n/a  Next refill due 04/06/18  No other controlled substances were filled during this time, please refill if appropriate.

## 2018-04-03 NOTE — Telephone Encounter (Signed)
Contacted pt and left a detailed vm informing pt that rx is ready for pickup  

## 2018-04-04 ENCOUNTER — Other Ambulatory Visit: Payer: Self-pay | Admitting: Internal Medicine

## 2018-04-04 DIAGNOSIS — I10 Essential (primary) hypertension: Secondary | ICD-10-CM

## 2018-04-04 NOTE — Telephone Encounter (Signed)
[name] checked to the controled database and showed me that pt got last rx filled on 03/07/18 for 120 tablets. I contacted pt and pt states she got her last rx on 02/28/2018. I informed pt that she got that rx filled on 03/07/18 and Dr. Wynetta Emery goes by when the rx was filled not when it was written. I informed pt that she should not be out of medication. Pt states she will have to check the bottle on her rx.

## 2018-04-04 NOTE — Telephone Encounter (Signed)
Pt called requesting that her  -acetaminophen-codeine (TYLENOL #4) 300-60 MG tablet Be refilled today,because she is completely out and the prescription printed does not allow her medications be refilled until 04/06/2018 please follow up

## 2018-04-04 NOTE — Telephone Encounter (Signed)
I contacted pt yesterday

## 2018-04-19 ENCOUNTER — Telehealth (INDEPENDENT_AMBULATORY_CARE_PROVIDER_SITE_OTHER): Payer: Self-pay | Admitting: Orthopaedic Surgery

## 2018-04-19 NOTE — Telephone Encounter (Signed)
Returned call to patient left message to call back. 

## 2018-04-24 MED FILL — AMLODIPINE BESYLATE 10 MG T: 10 | 30 days supply | Qty: 30 | Fill #0

## 2018-04-25 ENCOUNTER — Telehealth: Payer: Self-pay

## 2018-04-25 ENCOUNTER — Ambulatory Visit: Payer: Medicaid Other | Attending: Internal Medicine | Admitting: Internal Medicine

## 2018-04-25 ENCOUNTER — Encounter: Payer: Self-pay | Admitting: Internal Medicine

## 2018-04-25 VITALS — BP 166/108 | HR 71 | Temp 98.5°F | Resp 16 | Wt 164.2 lb

## 2018-04-25 DIAGNOSIS — Z23 Encounter for immunization: Secondary | ICD-10-CM | POA: Diagnosis not present

## 2018-04-25 DIAGNOSIS — Z1211 Encounter for screening for malignant neoplasm of colon: Secondary | ICD-10-CM | POA: Diagnosis not present

## 2018-04-25 DIAGNOSIS — E119 Type 2 diabetes mellitus without complications: Secondary | ICD-10-CM

## 2018-04-25 DIAGNOSIS — Z7984 Long term (current) use of oral hypoglycemic drugs: Secondary | ICD-10-CM | POA: Diagnosis not present

## 2018-04-25 DIAGNOSIS — M5416 Radiculopathy, lumbar region: Secondary | ICD-10-CM

## 2018-04-25 DIAGNOSIS — I10 Essential (primary) hypertension: Secondary | ICD-10-CM

## 2018-04-25 DIAGNOSIS — Z825 Family history of asthma and other chronic lower respiratory diseases: Secondary | ICD-10-CM | POA: Insufficient documentation

## 2018-04-25 DIAGNOSIS — M545 Low back pain: Secondary | ICD-10-CM | POA: Insufficient documentation

## 2018-04-25 DIAGNOSIS — Z8249 Family history of ischemic heart disease and other diseases of the circulatory system: Secondary | ICD-10-CM | POA: Diagnosis not present

## 2018-04-25 DIAGNOSIS — F17200 Nicotine dependence, unspecified, uncomplicated: Secondary | ICD-10-CM

## 2018-04-25 DIAGNOSIS — Z888 Allergy status to other drugs, medicaments and biological substances status: Secondary | ICD-10-CM | POA: Insufficient documentation

## 2018-04-25 DIAGNOSIS — N852 Hypertrophy of uterus: Secondary | ICD-10-CM

## 2018-04-25 DIAGNOSIS — M542 Cervicalgia: Secondary | ICD-10-CM | POA: Insufficient documentation

## 2018-04-25 DIAGNOSIS — E785 Hyperlipidemia, unspecified: Secondary | ICD-10-CM | POA: Diagnosis not present

## 2018-04-25 DIAGNOSIS — Z1239 Encounter for other screening for malignant neoplasm of breast: Secondary | ICD-10-CM

## 2018-04-25 DIAGNOSIS — F17210 Nicotine dependence, cigarettes, uncomplicated: Secondary | ICD-10-CM | POA: Diagnosis not present

## 2018-04-25 DIAGNOSIS — E1142 Type 2 diabetes mellitus with diabetic polyneuropathy: Secondary | ICD-10-CM | POA: Insufficient documentation

## 2018-04-25 DIAGNOSIS — Z79899 Other long term (current) drug therapy: Secondary | ICD-10-CM | POA: Diagnosis not present

## 2018-04-25 DIAGNOSIS — G5692 Unspecified mononeuropathy of left upper limb: Secondary | ICD-10-CM

## 2018-04-25 DIAGNOSIS — Z7951 Long term (current) use of inhaled steroids: Secondary | ICD-10-CM | POA: Diagnosis not present

## 2018-04-25 DIAGNOSIS — Z823 Family history of stroke: Secondary | ICD-10-CM | POA: Diagnosis not present

## 2018-04-25 DIAGNOSIS — J449 Chronic obstructive pulmonary disease, unspecified: Secondary | ICD-10-CM | POA: Diagnosis not present

## 2018-04-25 DIAGNOSIS — G8929 Other chronic pain: Secondary | ICD-10-CM | POA: Diagnosis not present

## 2018-04-25 DIAGNOSIS — Z981 Arthrodesis status: Secondary | ICD-10-CM | POA: Insufficient documentation

## 2018-04-25 LAB — POCT GLYCOSYLATED HEMOGLOBIN (HGB A1C): HbA1c POC (<> result, manual entry): 5.6 % (ref 4.0–5.6)

## 2018-04-25 LAB — GLUCOSE, POCT (MANUAL RESULT ENTRY): POC Glucose: 132 mg/dl — AB (ref 70–99)

## 2018-04-25 MED ORDER — ACETAMINOPHEN-CODEINE #4 300-60 MG PO TABS: 1.0000 | ORAL_TABLET | Freq: Four times a day (QID) | ORAL | 0 refills | Status: DC | PRN

## 2018-04-25 MED ORDER — LOSARTAN POTASSIUM 25 MG PO TABS: 25.0000 mg | ORAL_TABLET | Freq: Every day | ORAL | 6 refills | Status: DC

## 2018-04-25 MED ORDER — AMLODIPINE BESYLATE 10 MG PO TABS: 10.0000 mg | ORAL_TABLET | Freq: Every day | ORAL | 6 refills | Status: DC

## 2018-04-25 MED ORDER — HYDROCHLOROTHIAZIDE 25 MG PO TABS: 25.0000 mg | ORAL_TABLET | Freq: Every day | ORAL | 6 refills | Status: DC

## 2018-04-25 NOTE — Telephone Encounter (Signed)
Went on the [employer] to do prior auth for [place]   CPT- [phone] Ultrasound, pelvic (nonobstetric), B-scan and/or real time with image documentation, complete   76830 Ultrasound, transvaginal  ICD- N85.2 Hypertrophy of uterus  Authorization # [id number]   Effective- 04/25/2018 Expires- 05/25/2018   Contacted the scheduleing department and spoke with susan and schedule [place] and provided her with auth #

## 2018-04-25 NOTE — Progress Notes (Signed)
Patient ID: Charlene Allen, female    DOB: 03-25-1956  MRN: [id number]  CC: Hypertension and Diabetes   Subjective: Charlene Allen is a 63 y.o. female who presents for chronic ds management Her concerns today include:  Hx of chronic neck/lower back pain on Tylenol #4, Lyrica), HTN,HL,tob, obesity, COPD, depression S/p c-spine fusion 2018.  C/o not getting enough sleep due to pain in LT arm.  Started 3 wks ago. Worse at nights.  It "just starts throbbing and throbbing."  Endorses numbness and tingling in fingers.  Sees Dr. [name] tomorrow  [name] over results of MRI of lumbar spine.  This revealed stable multilevel lumbar spondylosis without discernible compressive phenomena.  Did reveal 2 mm of degenerative anterior listhesis at L4-5.  She continues to have pain in the lower back.  She plans to discuss with Dr. [name] tomorrow to see if anything surgical needs to be done.  She is requesting refill on Tylenol #4.  I had given her a limited prescription until she was seen today as she had missed her last appointment.  Patient states she had missed her last appointment because her daughter was badly injured in a car accident back in November.   Incidental finding on lumbar MRI was prominent endometrial cavity which is unexpected in this age group.  Radiologist recommend GYN follow-up and correlation with pelvic ultrasound. Needs RF on Tyl#4.  Reports good pain relief with the Tylenol No. 4 and Lyrica.  No significant side effects  HTN: She has been out of several of her blood pressure medications for 3 weeks including amlodipine and HCTZ.  She limits salt in the foods.  Does not check blood pressure.    DM:  Checks BS daily.  Gives range 120-144.  Does well with eating habits Walk her dogs daily for 30 mins.  Reports compliance with metformin.  COPD:  Uses albuterol inhaler after walking dogs because she gets out of breath after walking a hill.  Reports compliance with Symbicort.  She is down  to 1 cigarette a day.  "I just cannot get over that 1."  She is out of nicotine patches.  She plans to call 1 [phone] quit now to get more.  Patient Active Problem List   Diagnosis Date Noted  . Controlled type 2 diabetes mellitus without complication, without long-term current use of insulin ([place]) 11/15/2017  . Lumbar radiculopathy, chronic 11/15/2017  . Increased endometrial stripe thickness 09/08/2016  . Chronic neck pain 08/16/2016  . Chronic right shoulder pain 07/22/2016  . Morbid obesity due to excess calories ([place]) 06/20/2016  . COPD GOLD 0 still smoking  06/18/2016  . HPV test positive 10/13/2015  . Mild diastolic dysfunction 49/17/9150  . Chronic diarrhea 08/13/2015  . Cigarette smoker 08/13/2015  . Chronic back pain 03/15/2013  . Essential hypertension, benign 03/15/2013  . History of substance abuse ([place]) 03/15/2013     Current Outpatient Medications on File Prior to Visit  Medication Sig Dispense Refill  . acetaminophen-codeine (TYLENOL #4) 300-60 MG tablet Take 1 tablet by mouth every 6 (six) hours as needed for moderate pain. To be filled on or after 01/30/2018 120 tablet 0  . acetaminophen-codeine (TYLENOL #4) 300-60 MG tablet Take 1 tablet by mouth every 6 (six) hours as needed for moderate pain. 120 tablet 0  . acetaminophen-codeine (TYLENOL #4) 300-60 MG tablet Take 1 tablet by mouth every 6 (six) hours as needed for moderate pain. Fill on or after 04/06/2018 80 tablet 0  .  albuterol (PROVENTIL HFA;VENTOLIN HFA) 108 (90 Base) MCG/ACT inhaler Inhale 2 puffs into the lungs every 6 (six) hours as needed for wheezing or shortness of breath. 1 Inhaler 3  . amLODipine (NORVASC) 10 MG tablet TAKE 1 TABLET BY MOUTH DAILY. 30 tablet 0  . amoxicillin-clavulanate (AUGMENTIN) 500-125 MG tablet TAKE ONE TABLET BY MOUTH THREE TIMES DAILY TILL GONE  0  . atorvastatin (LIPITOR) 20 MG tablet Take 1 tablet (20 mg total) by mouth daily. 90 tablet 1  . Blood Glucose Monitoring Suppl (TRUE  METRIX METER) w/Device KIT Use as directed 1 kit 0  . budesonide-formoterol (SYMBICORT) 80-4.5 MCG/ACT inhaler Take 2 puffs first thing in am and then another 2 puffs about 12 hours later. (Patient not taking: Reported on 08/25/2017) 1 Inhaler 11  . fluticasone (FLONASE) 50 MCG/ACT nasal spray Place 1 spray into both nostrils daily as needed for allergies or rhinitis.    Marland Kitchen glucose blood (TRUE METRIX BLOOD GLUCOSE TEST) test strip Use as instructed 100 each 12  . hydrochlorothiazide (HYDRODIURIL) 25 MG tablet Take 1 tablet (25 mg total) by mouth daily. 30 tablet 0  . losartan (COZAAR) 25 MG tablet Take 1 tablet (25 mg total) by mouth daily. 30 tablet 6  . metFORMIN (GLUCOPHAGE) 500 MG tablet Take 1 tablet (500 mg total) by mouth 2 (two) times daily with a meal. 60 tablet 3  . methocarbamol (ROBAXIN) 500 MG tablet Take 1 tablet (500 mg total) by mouth every 8 (eight) hours as needed for muscle spasms. 40 tablet 0  . nicotine (NICODERM CQ - DOSED IN MG/24 HOURS) 14 mg/24hr patch Place 1 patch (14 mg total) onto the skin daily. 28 patch 0  . nitroGLYCERIN (NITROSTAT) 0.4 MG SL tablet Place 1 tablet (0.4 mg total) under the tongue every 5 (five) minutes as needed for chest pain. If you need more than 2 doses call 911 20 tablet 1  . nitroGLYCERIN (NITROSTAT) 0.4 MG SL tablet Place 1 tablet (0.4 mg total) under the tongue every 5 (five) minutes as needed for chest pain.  12  . Potassium Chloride ER 20 MEQ TBCR Take 20 mEq by mouth daily. 30 tablet 5  . potassium chloride SA (K-DUR,KLOR-CON) 20 MEQ tablet TAKE ONE TABLET BY MOUTH EVERY DAY 30 tablet 6  . predniSONE (DELTASONE) 5 MG tablet Take 1 tablet (5 mg total) by mouth daily with breakfast. (Patient not taking: Reported on 04/25/2018) 10 tablet 0  . pregabalin (LYRICA) 75 MG capsule Take 1 capsule (75 mg total) by mouth 3 (three) times daily. For PASS 270 capsule 1  . TRUEPLUS LANCETS 28G MISC Use as directed 100 each 12   No current  facility-administered medications on file prior to visit.     Allergies  Allergen Reactions  . Lisinopril Nausea Only    Social History   Socioeconomic History  . Marital status: Married    Spouse name: Not on file  . Number of children: Not on file  . Years of education: Not on file  . Highest education level: Not on file  Occupational History  . Not on file  Social Needs  . Financial resource strain: Not on file  . Food insecurity:    Worry: Not on file    Inability: Not on file  . Transportation needs:    Medical: Not on file    Non-medical: Not on file  Tobacco Use  . Smoking status: Current Every Day Smoker    Packs/day: 0.50  Years: 40.00    Pack years: 20.00    Types: Cigarettes  . Smokeless tobacco: Never Used  Substance and Sexual Activity  . Alcohol use: No    Alcohol/week: 0.0 standard drinks  . Drug use: No  . Sexual activity: Not Currently    Birth control/protection: None  Lifestyle  . Physical activity:    Days per week: Not on file    Minutes per session: Not on file  . Stress: Not on file  Relationships  . Social connections:    Talks on phone: Not on file    Gets together: Not on file    Attends religious service: Not on file    Active member of club or organization: Not on file    Attends meetings of clubs or organizations: Not on file    Relationship status: Not on file  . Intimate partner violence:    Fear of current or ex partner: Not on file    Emotionally abused: Not on file    Physically abused: Not on file    Forced sexual activity: Not on file  Other Topics Concern  . Not on file  Social History Narrative  . Not on file    Family History  Problem Relation Age of Onset  . COPD Mother   . Stroke Mother   . Heart disease Mother   . Hypertension Mother   . Cancer Sister 25       cervical cancer   . Stroke Maternal Grandmother     Past Surgical History:  Procedure Laterality Date  . ABDOMINAL AORTOGRAM N/A 07/12/2016     Procedure: Abdominal Aortogram;  Surgeon: Jettie Booze, MD;  Location: [facility];  Service: Cardiovascular;  Laterality: N/A;  . ANTERIOR CERVICAL DECOMP/DISCECTOMY FUSION N/A 09/05/2017   Procedure: C4-5, C5-6 ANTERIOR CERVICAL DECOMPRESSION/DISCECTOMY FUSION, ALLOGRAFT, PLATE;  Surgeon: Marybelle Killings, MD;  Location: [facility];  Service: Orthopedics;  Laterality: N/A;  . HAND SURGERY    . LEFT HEART CATH AND CORONARY ANGIOGRAPHY N/A 07/12/2016   Procedure: Left Heart Cath and Coronary Angiography;  Surgeon: Jettie Booze, MD;  Location: [facility];  Service: Cardiovascular;  Laterality: N/A;  . TUBAL LIGATION      ROS: Review of Systems Negative except as above PHYSICAL EXAM: BP (!) 166/108   Pulse 71   Temp 98.5 F (36.9 C) (Oral)   Resp 16   Wt 164 lb 3.2 oz (74.5 kg)   SpO2 96%   BMI 29.09 kg/m   Wt Readings from Last 3 Encounters:  04/25/18 164 lb 3.2 oz (74.5 kg)  12/21/17 160 lb (72.6 kg)  11/23/17 160 lb (72.6 kg)   Physical Exam  General appearance - alert, well appearing, and in no distress Mental status - normal mood, behavior, speech, dress, motor activity, and thought processes Neck -no cervical axillary lymphadenopathy.  No thyroid enlargement. Chest - clear to auscultation, no wheezes, rales or rhonchi, symmetric air entry Heart - normal rate, regular rhythm, normal S1, S2, no murmurs, rubs, clicks or gallops Extremities -no lower extremity edema. Results for orders placed or performed in visit on 04/25/18  POCT glucose (manual entry)  Result Value Ref Range   POC Glucose 132 (A) 70 - 99 mg/dl  POCT glycosylated hemoglobin (Hb A1C)  Result Value Ref Range   Hemoglobin A1C     HbA1c POC (<> result, manual entry) 5.6 4.0 - 5.6 %   HbA1c, POC (prediabetic range)  HbA1c, POC (controlled diabetic range)        ASSESSMENT AND PLAN:  1. Controlled type 2 diabetes mellitus without complication, without long-term current use of  insulin (HCC) Continue metformin.  Dietary counseling given.  Continue regular exercise. - POCT glucose (manual entry) - POCT glycosylated hemoglobin (Hb A1C) - Ambulatory referral to Ophthalmology  2. Essential hypertension Not at goal.  She has been out of amlodipine and HCTZ.  Refills given.  Continue low-salt diet. - losartan (COZAAR) 25 MG tablet; Take 1 tablet (25 mg total) by mouth daily.  Dispense: 30 tablet; Refill: 6 - hydrochlorothiazide (HYDRODIURIL) 25 MG tablet; Take 1 tablet (25 mg total) by mouth daily.  Dispense: 30 tablet; Refill: 6 - amLODipine (NORVASC) 10 MG tablet; Take 1 tablet (10 mg total) by mouth daily.  Dispense: 30 tablet; Refill: 6  3. COPD mixed type (HCC) Stable on Symbicort.  Advised to quit smoking.  4. Tobacco dependence Patient advised to quit smoking. Discussed health risks associated with smoking including lung and other types of cancers, chronic lung diseases and CV risks.. Pt ready to give trail of quitting.  She will call 1 [phone] quit now to get additional nicotine patches.  Encouraged her to set a quit date.   3 minutes spent on counseling  5. Neuropathy, arm, left 6. Lumbar radiculopathy, chronic Keep follow-up appointment with Dr. [name] tomorrow.  She does not feel she needs a new referral to him in order to discuss the MRI of the lumbar spine results. -On Tylenol#4 and Lyrica with adequate pain control.  Refills given 2 prescriptions. Controlled substance prescribing agreement done 06/2017. [place]CCS[place] reviewed - acetaminophen-codeine (TYLENOL #4) 300-60 MG tablet; Take 1 tablet by mouth every 6 (six) hours as needed for moderate pain.  Dispense: 120 tablet; Refill: 0 - acetaminophen-codeine (TYLENOL #4) 300-60 MG tablet; Take 1 tablet by mouth every 6 (six) hours as needed for moderate pain. Fill on or after 05/25/2018  Dispense: 120 tablet; Refill: 0  7. Need for vaccination against Streptococcus pneumoniae Given  8. Enlarged uterus Will get  pelvic ultrasound and further management will be based on results - US Pelvic Complete With Transvaginal; Future - US Transvaginal Non-OB; Future  9. Colon cancer screening Discussed colon cancer screening and methods of doing so.  She does not want to have colonoscopy but is agreeable to fit test. - Fecal occult blood, imunochemical([facility])  10. Breast cancer screening - MM Digital Screening; Future   Patient was given the opportunity to ask questions.  Patient verbalized understanding of the plan and was able to repeat key elements of the plan.   Orders Placed This Encounter  Procedures  . Pneumococcal polysaccharide vaccine 23-valent greater than or equal to 2yo subcutaneous/IM  . POCT glucose (manual entry)  . POCT glycosylated hemoglobin (Hb A1C)     Requested Prescriptions    No prescriptions requested or ordered in this encounter    No follow-ups on file.  Karle Plumber, MD, FACP

## 2018-04-25 NOTE — Patient Instructions (Addendum)
I have sent refills on your blood pressure medications to your pharmacy. We will get a pelvic ultrasound to evaluate the enlarged uterus seen on the MRI.  Further management will be based on results.  Pneumococcal Polysaccharide Vaccine: What You Need to Know 1. Why get vaccinated? Vaccination can protect older adults (and some children and younger adults) from pneumococcal disease. Pneumococcal disease is caused by bacteria that can spread from person to person through close contact. It can cause ear infections, and it can also lead to more serious infections of the:  Lungs (pneumonia),  Blood (bacteremia), and  Covering of the brain and spinal cord (meningitis). Meningitis can cause deafness and brain damage, and it can be fatal. Anyone can get pneumococcal disease, but children under 81 years of age, people with certain medical conditions, adults over 12 years of age, and cigarette smokers are at the highest risk. About 18,000 older adults die each year from pneumococcal disease in the [place]. Treatment of pneumococcal infections with penicillin and other drugs used to be more effective. But some strains of the disease have become resistant to these drugs. This makes prevention of the disease, through vaccination, even more important. 2. Pneumococcal polysaccharide vaccine (PPSV23) Pneumococcal polysaccharide vaccine (PPSV23) protects against 23 types of pneumococcal bacteria. It will not prevent all pneumococcal disease. PPSV23 is recommended for:  All adults 61 years of age and older,  Anyone 2 through 62 years of age with certain long-term health problems,  Anyone 2 through 62 years of age with a weakened immune system,  Adults 38 through 62 years of age who smoke cigarettes or have asthma. Most people need only one dose of PPSV. A second dose is recommended for certain high-risk groups. People 16 and older should get a dose even if they have gotten one or more doses of the  vaccine before they turned 65. Your healthcare provider can give you more information about these recommendations. Most healthy adults develop protection within 2 to 3 weeks of getting the shot. 3. Some people should not get this vaccine  Anyone who has had a life-threatening allergic reaction to PPSV should not get another dose.  Anyone who has a severe allergy to any component of PPSV should not receive it. Tell your provider if you have any severe allergies.  Anyone who is moderately or severely ill when the shot is scheduled may be asked to wait until they recover before getting the vaccine. Someone with a mild illness can usually be vaccinated.  Children less than 70 years of age should not receive this vaccine.  There is no evidence that PPSV is harmful to either a pregnant woman or to her fetus. However, as a precaution, women who need the vaccine should be vaccinated before becoming pregnant, if possible. 4. Risks of a vaccine reaction With any medicine, including vaccines, there is a chance of side effects. These are usually mild and go away on their own, but serious reactions are also possible. About half of people who get PPSV have mild side effects, such as redness or pain where the shot is given, which go away within about two days. Less than 1 out of 100 people develop a fever, muscle aches, or more severe local reactions. Problems that could happen after any vaccine:  People sometimes faint after a medical procedure, including vaccination. Sitting or lying down for about 15 minutes can help prevent fainting, and injuries caused by a fall. Tell your doctor if you feel dizzy, or  have vision changes or ringing in the ears.  Some people get severe pain in the shoulder and have difficulty moving the arm where a shot was given. This happens very rarely.  Any medication can cause a severe allergic reaction. Such reactions from a vaccine are very rare, estimated at about 1 in a million  doses, and would happen within a few minutes to a few hours after the vaccination. As with any medicine, there is a very remote chance of a vaccine causing a serious injury or death. The safety of vaccines is always being monitored. For more information, visit: http://www.aguilar.org/ 5. What if there is a serious reaction? What should I look for? Look for anything that concerns you, such as signs of a severe allergic reaction, very high fever, or unusual behavior. Signs of a severe allergic reaction can include hives, swelling of the face and throat, difficulty breathing, a fast heartbeat, dizziness, and weakness. These would usually start a few minutes to a few hours after the vaccination. What should I do? If you think it is a severe allergic reaction or other emergency that can't wait, call 9-1-1 or get to the nearest hospital. Otherwise, call your doctor. Afterward, the reaction should be reported to the Vaccine Adverse Event Reporting System (VAERS). Your doctor might file this report, or you can do it yourself through the VAERS web site at www.vaers.SamedayNews.es, or by calling 781-192-5617. VAERS does not give medical advice. 6. How can I learn more?  Ask your doctor. He or she can give you the vaccine package insert or suggest other sources of information.  Call your local or state health department.  Contact the Centers for Disease Control and Prevention (CDC): ? Call 272-251-1422 (1-800-CDC-INFO) or ? Visit CDC's website at http://hunter.com/ CDC Vaccine Information Statement PPSV Vaccine (07/20/2013) This information is not intended to replace advice given to you by your health care provider. Make sure you discuss any questions you have with your health care provider. Document Released: 01/10/2006 Document Revised: 10/25/2017 Document Reviewed: 10/25/2017 Elsevier Interactive Patient Education  2019 [employer].

## 2018-04-26 ENCOUNTER — Ambulatory Visit (INDEPENDENT_AMBULATORY_CARE_PROVIDER_SITE_OTHER): Payer: Medicaid Other | Admitting: Orthopaedic Surgery

## 2018-04-26 ENCOUNTER — Encounter (INDEPENDENT_AMBULATORY_CARE_PROVIDER_SITE_OTHER): Payer: Self-pay | Admitting: Orthopaedic Surgery

## 2018-04-26 VITALS — BP 131/77 | HR 68 | Ht 63.0 in | Wt 164.0 lb

## 2018-04-26 DIAGNOSIS — Z981 Arthrodesis status: Secondary | ICD-10-CM | POA: Diagnosis not present

## 2018-04-26 DIAGNOSIS — G5622 Lesion of ulnar nerve, left upper limb: Secondary | ICD-10-CM | POA: Diagnosis not present

## 2018-04-26 DIAGNOSIS — M67922 Unspecified disorder of synovium and tendon, left upper arm: Secondary | ICD-10-CM

## 2018-04-26 NOTE — Addendum Note (Signed)
Addended by: Mal Misty L on: 04/26/2018 12:50 PM   Modules accepted: Orders

## 2018-04-26 NOTE — Progress Notes (Signed)
Office Visit Note   Patient: Charlene Allen           Date of Birth: 29-Nov-1955           MRN: [id number] Visit Date: 04/26/2018              Requested by: Ladell Pier, MD [address] [place], [place] [address] PCP: Ladell Pier, MD   Assessment & Plan: Visit Diagnoses:  1. Tendinopathy of left biceps   2. Cubital tunnel syndrome on left   3. Hx of fusion of cervical spine     Plan: We discussed avoiding pulling for the distal biceps tendinopathy.  Will obtain nerve conduction velocities EMGs to rule out cubital tunnel syndrome with her severe pain that is waking her up at night dropping objects and weakness with gripping.  Office follow-up after electrical test.  Follow-Up Instructions: No follow-ups on file.   Orders:  No orders of the defined types were placed in this encounter.  No orders of the defined types were placed in this encounter.     Procedures: No procedures performed   Clinical Data: No additional findings.   Subjective: Chief Complaint  Patient presents with  . Left Arm Pain    HPI 63 year old female returns problems with her left arm with pain in her left elbow along the medial aspect that radiates to her small finger.  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  Left hand wakes her up at night she always finds that her elbow is in a flexed position.  She is also had some pain anteriorly over the elbow and has pain with flexing the elbow and resisted biceps function.  She denies chills or fever.  She was diagnosed with type 2 diabetes but is on metformin and A1c yesterday was good at 5.6.  Review of Systems 14 point update unchanged from 08/03/2017.  Positive coronary disease previous cervical fusion June 2019 C4-5 C5-6.  Previous carpal tunnel releases in the 80s.  Patient is not working is on disability since the fall.  Positive for obesity.  Negative otherwise except  as noted in HPI with diabetes diagnosis last year type II.   Objective: Vital Signs: BP 131/77   Pulse 68   Ht 5\' 3"  (1.6 m)   Wt 164 lb (74.4 kg)   BMI 29.05 kg/m   Physical Exam Constitutional:      Appearance: She is well-developed.  HENT:     Head: Normocephalic.     Right Ear: External ear normal.     Left Ear: External ear normal.  Eyes:     Pupils: Pupils are equal, round, and reactive to light.  Neck:     Thyroid: No thyromegaly.     Trachea: No tracheal deviation.  Cardiovascular:     Rate and Rhythm: Normal rate.  Pulmonary:     Effort: Pulmonary effort is normal.  Abdominal:     Palpations: Abdomen is soft.  Skin:    General: Skin is warm and dry.  Neurological:     Mental Status: She is alert and oriented to person, place, and time.  Psychiatric:        Behavior: Behavior normal.     Ortho Exam well-healed anterior cervical incision good cervical range of motion without pain normal heel toe gait no hyperreflexia.  She has exquisite tenderness with palpation of the distal biceps tendon in the antecubital  fossa following it down to the biceps tuberosity of the radius.  No elbow effusion collateral ligaments are stable.  Exquisite discomfort with percussion over the cubital tunnel on the left.  No interosseous weakness or atrophy noted.  Well-healed carpal tunnel incision.  Specialty Comments:  No specialty comments available.  Imaging: No results found.   PMFS History: Patient Active Problem List   Diagnosis Date Noted  . Controlled type 2 diabetes mellitus without complication, without long-term current use of insulin ([place] [place]) 11/15/2017  . Lumbar radiculopathy, chronic 11/15/2017  . Increased endometrial stripe thickness 09/08/2016  . Chronic right shoulder pain 07/22/2016  . COPD GOLD 0 still smoking  06/18/2016  . HPV test positive 10/13/2015  . Mild diastolic dysfunction 11/09/4816  . Chronic diarrhea 08/13/2015  . Cigarette smoker 08/13/2015  .  Chronic back pain 03/15/2013  . Essential hypertension, benign 03/15/2013  . History of substance abuse ([facility]) 03/15/2013   Past Medical History:  Diagnosis Date  . Diabetes mellitus without complication ([facility])   . Hypertension Dx 2015  . Obesity   . PMB (postmenopausal bleeding)     Family History  Problem Relation Age of Onset  . COPD Mother   . Stroke Mother   . Heart disease Mother   . Hypertension Mother   . Cancer Sister 25       cervical cancer   . Stroke Maternal Grandmother     Past Surgical History:  Procedure Laterality Date  . ABDOMINAL AORTOGRAM N/A 07/12/2016   Procedure: Abdominal Aortogram;  Surgeon: Jettie Booze, MD;  Location: [facility];  Service: Cardiovascular;  Laterality: N/A;  . ANTERIOR CERVICAL DECOMP/DISCECTOMY FUSION N/A 09/05/2017   Procedure: C4-5, C5-6 ANTERIOR CERVICAL DECOMPRESSION/DISCECTOMY FUSION, ALLOGRAFT, PLATE;  Surgeon: Marybelle Killings, MD;  Location: [facility];  Service: Orthopedics;  Laterality: N/A;  . HAND SURGERY    . LEFT HEART CATH AND CORONARY ANGIOGRAPHY N/A 07/12/2016   Procedure: Left Heart Cath and Coronary Angiography;  Surgeon: Jettie Booze, MD;  Location: [facility];  Service: Cardiovascular;  Laterality: N/A;  . TUBAL LIGATION     Social History   Occupational History  . Not on file  Tobacco Use  . Smoking status: Current Every Day Smoker    Packs/day: 0.50    Years: 40.00    Pack years: 20.00    Types: Cigarettes  . Smokeless tobacco: Never Used  Substance and Sexual Activity  . Alcohol use: No    Alcohol/week: 0.0 standard drinks  . Drug use: No  . Sexual activity: Not Currently    Birth control/protection: None

## 2018-05-01 ENCOUNTER — Ambulatory Visit (HOSPITAL_COMMUNITY): Admission: RE | Admit: 2018-05-01 | Payer: Medicaid Other | Source: Ambulatory Visit

## 2018-05-01 ENCOUNTER — Other Ambulatory Visit: Payer: Self-pay | Admitting: Internal Medicine

## 2018-05-01 DIAGNOSIS — I10 Essential (primary) hypertension: Secondary | ICD-10-CM

## 2018-05-11 ENCOUNTER — Encounter (INDEPENDENT_AMBULATORY_CARE_PROVIDER_SITE_OTHER): Payer: Self-pay | Admitting: Physical Medicine and Rehabilitation

## 2018-05-23 ENCOUNTER — Ambulatory Visit (INDEPENDENT_AMBULATORY_CARE_PROVIDER_SITE_OTHER): Payer: Self-pay | Admitting: Orthopaedic Surgery

## 2018-05-26 ENCOUNTER — Encounter (INDEPENDENT_AMBULATORY_CARE_PROVIDER_SITE_OTHER): Payer: Self-pay | Admitting: Physical Medicine and Rehabilitation

## 2018-05-26 ENCOUNTER — Ambulatory Visit (INDEPENDENT_AMBULATORY_CARE_PROVIDER_SITE_OTHER): Payer: Medicaid Other | Admitting: Physical Medicine and Rehabilitation

## 2018-05-26 DIAGNOSIS — R202 Paresthesia of skin: Secondary | ICD-10-CM | POA: Diagnosis not present

## 2018-05-26 NOTE — Progress Notes (Signed)
 .  Numeric Pain Rating Scale and Functional Assessment Average Pain 7   In the last MONTH (on 0-10 scale) has pain interfered with the following?  1. General activity like being  able to carry out your everyday physical activities such as walking, climbing stairs, carrying groceries, or moving a chair?  Rating(6)     

## 2018-05-29 ENCOUNTER — Other Ambulatory Visit: Payer: Medicaid Other | Admitting: Internal Medicine

## 2018-05-31 NOTE — Procedures (Signed)
EMG & NCV Findings: Evaluation of the left median (across palm) sensory nerve showed prolonged distal peak latency (Wrist, 3.8 ms) and prolonged distal peak latency (Palm, 2.4 ms).  All remaining nerves (as indicated in the following tables) were within normal limits.    All examined muscles (as indicated in the following table) showed no evidence of electrical instability.    Impression: The above electrodiagnostic study is ABNORMAL and reveals evidence of a very mild left median nerve entrapment at the wrist  affecting sensory components.  Her symptoms do not really fit with carpal tunnel syndrome and clinical correlation is paramount.  Agree that patient symptoms could be related to tendinitis.   There is no significant electrodiagnostic evidence of any other focal nerve entrapment, brachial plexopathy or cervical radiculopathy.   Recommendations: 1.  Follow-up with referring physician. 2.  Continue current management of symptoms. 3.  Continue use of resting splint at night-time and as needed during the day.  ___________________________ Wonda Olds Board Certified, American Board of Physical Medicine and Rehabilitation    Nerve Conduction Studies Anti Sensory Summary Table   Stim Site NR Peak (ms) Norm Peak (ms) P-T Amp (V) Norm P-T Amp Site1 Site2 Delta-P (ms) Dist (cm) Vel (m/s) Norm Vel (m/s)  Left Median Acr Palm Anti Sensory (2nd Digit)  32.3C  Wrist    *3.8 <3.6 22.1 >10 Wrist Palm 1.4 0.0    Palm    *2.4 <2.0 1.7         Left Radial Anti Sensory (Base 1st Digit)  32.5C  Wrist    2.0 <3.1 20.9  Wrist Base 1st Digit 2.0 0.0    Left Ulnar Anti Sensory (5th Digit)  32.6C  Wrist    3.2 <3.7 25.3 >15.0 Wrist 5th Digit 3.2 14.0 44 >38   Motor Summary Table   Stim Site NR Onset (ms) Norm Onset (ms) O-P Amp (mV) Norm O-P Amp Site1 Site2 Delta-0 (ms) Dist (cm) Vel (m/s) Norm Vel (m/s)  Left Median Motor (Abd Poll Brev)  32.8C  Wrist    3.9 <4.2 5.5 >5 Elbow Wrist 3.2  18.0 56 >50  Elbow    7.1  4.8         Left Ulnar Motor (Abd Dig Min)  32.9C  Wrist    3.0 <4.2 10.9 >3 B Elbow Wrist 2.7 17.0 63 >53  B Elbow    5.7  10.6  A Elbow B Elbow 1.3 10.0 77 >53  A Elbow    7.0  10.1          EMG   Side Muscle Nerve Root Ins Act Fibs Psw Amp Dur Poly Recrt Int Fraser Din Comment  Left Abd Poll Brev Median C8-T1 Nml Nml Nml Nml Nml 0 Nml Nml   Left 1stDorInt Ulnar C8-T1 Nml Nml Nml Nml Nml 0 Nml Nml   Left PronatorTeres Median C6-7 Nml Nml Nml Nml Nml 0 Nml Nml   Left Biceps Musculocut C5-6 Nml Nml Nml Nml Nml 0 Nml Nml   Left Deltoid Axillary C5-6 Nml Nml Nml Nml Nml 0 Nml Nml     Nerve Conduction Studies Anti Sensory Left/Right Comparison   Stim Site L Lat (ms) R Lat (ms) L-R Lat (ms) L Amp (V) R Amp (V) L-R Amp (%) Site1 Site2 L Vel (m/s) R Vel (m/s) L-R Vel (m/s)  Median Acr Palm Anti Sensory (2nd Digit)  32.3C  Wrist *3.8   22.1   Wrist Palm     Palm *  2.4   1.7         Radial Anti Sensory (Base 1st Digit)  32.5C  Wrist 2.0   20.9   Wrist Base 1st Digit     Ulnar Anti Sensory (5th Digit)  32.6C  Wrist 3.2   25.3   Wrist 5th Digit 44     Motor Left/Right Comparison   Stim Site L Lat (ms) R Lat (ms) L-R Lat (ms) L Amp (mV) R Amp (mV) L-R Amp (%) Site1 Site2 L Vel (m/s) R Vel (m/s) L-R Vel (m/s)  Median Motor (Abd Poll Brev)  32.8C  Wrist 3.9   5.5   Elbow Wrist 56    Elbow 7.1   4.8         Ulnar Motor (Abd Dig Min)  32.9C  Wrist 3.0   10.9   B Elbow Wrist 63    B Elbow 5.7   10.6   A Elbow B Elbow 77    A Elbow 7.0   10.1            Waveforms:

## 2018-05-31 NOTE — Progress Notes (Signed)
Charlene Allen - 63 y.o. female MRN [id number]  Date of birth: May 26, 1955  Office Visit Note: Visit Date: 05/26/2018 PCP: Ladell Pier, MD Referred by: Ladell Pier, MD  Subjective: Chief Complaint  Patient presents with  . Left Forearm - Numbness, Pain  . Left Elbow - Numbness, Pain   HPI: Charlene Allen is a 63 y.o. female who comes in today At the request of Dr. Rodell Perna for electrodiagnostic study of the left upper extremity.  Patient is right-hand dominant.  She is reporting pain in the left medial elbow and forearm with some numbness.  No numbness tingling in the hands or digits.  She reports this is been ongoing for about a year.  She reports that laying down at night seems to make the symptoms worse and she is having trouble with gripping objects that makes the pain increase with gripping and holding things.  She denies right-sided symptoms.  She has a prior history of cervical ACDF in June of last year.  MRI prior to the surgery showing severe stenosis.  She has had bilateral carpal tunnel release in the 1980s.  She has type 2 diabetes.  ROS Otherwise per HPI.  Assessment & Plan: Visit Diagnoses:  1. Paresthesia of skin     Plan: Impression: The above electrodiagnostic study is ABNORMAL and reveals evidence of a very mild left median nerve entrapment at the wrist  affecting sensory components.  Her symptoms do not really fit with carpal tunnel syndrome and clinical correlation is paramount.  Agree that patient symptoms could be related to tendinitis.   There is no significant electrodiagnostic evidence of any other focal nerve entrapment, brachial plexopathy or cervical radiculopathy.   Recommendations: 1.  Follow-up with referring physician. 2.  Continue current management of symptoms. 3.  Continue use of resting splint at night-time and as needed during the day.  Meds & Orders: No orders of the defined types were placed in this encounter.   Orders  Placed This Encounter  Procedures  . NCV with EMG (electromyography)    Follow-up: Return for Rodell Perna, M.D..   Procedures: No procedures performed  EMG & NCV Findings: Evaluation of the left median (across palm) sensory nerve showed prolonged distal peak latency (Wrist, 3.8 ms) and prolonged distal peak latency (Palm, 2.4 ms).  All remaining nerves (as indicated in the following tables) were within normal limits.    All examined muscles (as indicated in the following table) showed no evidence of electrical instability.    Impression: The above electrodiagnostic study is ABNORMAL and reveals evidence of a very mild left median nerve entrapment at the wrist  affecting sensory components.  Her symptoms do not really fit with carpal tunnel syndrome and clinical correlation is paramount.  Agree that patient symptoms could be related to tendinitis.   There is no significant electrodiagnostic evidence of any other focal nerve entrapment, brachial plexopathy or cervical radiculopathy.   Recommendations: 1.  Follow-up with referring physician. 2.  Continue current management of symptoms. 3.  Continue use of resting splint at night-time and as needed during the day.  ___________________________ Wonda Olds Board Certified, American Board of Physical Medicine and Rehabilitation    Nerve Conduction Studies Anti Sensory Summary Table   Stim Site NR Peak (ms) Norm Peak (ms) P-T Amp (V) Norm P-T Amp Site1 Site2 Delta-P (ms) Dist (cm) Vel (m/s) Norm Vel (m/s)  Left Median Acr Palm Anti Sensory (2nd Digit)  32.3C  Wrist    *  3.8 <3.6 22.1 >10 Wrist Palm 1.4 0.0    Palm    *2.4 <2.0 1.7         Left Radial Anti Sensory (Base 1st Digit)  32.5C  Wrist    2.0 <3.1 20.9  Wrist Base 1st Digit 2.0 0.0    Left Ulnar Anti Sensory (5th Digit)  32.6C  Wrist    3.2 <3.7 25.3 >15.0 Wrist 5th Digit 3.2 14.0 44 >38   Motor Summary Table   Stim Site NR Onset (ms) Norm Onset (ms) O-P Amp (mV)  Norm O-P Amp Site1 Site2 Delta-0 (ms) Dist (cm) Vel (m/s) Norm Vel (m/s)  Left Median Motor (Abd Poll Brev)  32.8C  Wrist    3.9 <4.2 5.5 >5 Elbow Wrist 3.2 18.0 56 >50  Elbow    7.1  4.8         Left Ulnar Motor (Abd Dig Min)  32.9C  Wrist    3.0 <4.2 10.9 >3 B Elbow Wrist 2.7 17.0 63 >53  B Elbow    5.7  10.6  A Elbow B Elbow 1.3 10.0 77 >53  A Elbow    7.0  10.1          EMG   Side Muscle Nerve Root Ins Act Fibs Psw Amp Dur Poly Recrt Int Fraser Din Comment  Left Abd Poll Brev Median C8-T1 Nml Nml Nml Nml Nml 0 Nml Nml   Left 1stDorInt Ulnar C8-T1 Nml Nml Nml Nml Nml 0 Nml Nml   Left PronatorTeres Median C6-7 Nml Nml Nml Nml Nml 0 Nml Nml   Left Biceps Musculocut C5-6 Nml Nml Nml Nml Nml 0 Nml Nml   Left Deltoid Axillary C5-6 Nml Nml Nml Nml Nml 0 Nml Nml     Nerve Conduction Studies Anti Sensory Left/Right Comparison   Stim Site L Lat (ms) R Lat (ms) L-R Lat (ms) L Amp (V) R Amp (V) L-R Amp (%) Site1 Site2 L Vel (m/s) R Vel (m/s) L-R Vel (m/s)  Median Acr Palm Anti Sensory (2nd Digit)  32.3C  Wrist *3.8   22.1   Wrist Palm     Palm *2.4   1.7         Radial Anti Sensory (Base 1st Digit)  32.5C  Wrist 2.0   20.9   Wrist Base 1st Digit     Ulnar Anti Sensory (5th Digit)  32.6C  Wrist 3.2   25.3   Wrist 5th Digit 44     Motor Left/Right Comparison   Stim Site L Lat (ms) R Lat (ms) L-R Lat (ms) L Amp (mV) R Amp (mV) L-R Amp (%) Site1 Site2 L Vel (m/s) R Vel (m/s) L-R Vel (m/s)  Median Motor (Abd Poll Brev)  32.8C  Wrist 3.9   5.5   Elbow Wrist 56    Elbow 7.1   4.8         Ulnar Motor (Abd Dig Min)  32.9C  Wrist 3.0   10.9   B Elbow Wrist 63    B Elbow 5.7   10.6   A Elbow B Elbow 77    A Elbow 7.0   10.1            Waveforms:            Clinical History: MRI CERVICAL SPINE WITHOUT CONTRAST  TECHNIQUE: Multiplanar, multisequence MR imaging of the cervical spine was performed. No intravenous contrast was administered.  COMPARISON:  02/02/2016 MRI of the  cervical spine.  FINDINGS: Alignment: Straightening of cervical lordosis. Grade 1 C3-4 anterolisthesis.  Vertebrae: Mild endplate edema at the C3 and C4 levels, likely degenerative. No abnormal intervertebral disc signal.  Cord: No abnormal cord signal.  Posterior Fossa, vertebral arteries, paraspinal tissues: Enlarged right posterior cervical chain lymph node at the C4-5 level measuring 13 x 11 mm.  Disc levels:  C2-3: No significant disc displacement, foraminal stenosis, or canal stenosis.  C3-4: Stable disc osteophyte complex with severe right and moderate left uncovertebral and facet hypertrophy. Severe right and moderate left foraminal stenosis. Mild canal stenosis.  C4-5: Stable disc osteophyte complex with left-greater-than-right uncovertebral and facet hypertrophy. Severe left and moderate right foraminal stenosis. Severe canal stenosis with cord impingement.  C5-6: Stable disc osteophyte complex eccentric to right subarticular zone with bilateral uncovertebral and facet hypertrophy. Moderate bilateral foraminal stenosis. Severe canal stenosis with right greater than left cord impingement.  C6-7: Disc osteophyte complex with left-greater-than-right uncovertebral and facet hypertrophy. Mild right and moderate left foraminal stenosis. No significant canal stenosis.  C7-T1: Disc osteophyte complex with uncovertebral and facet hypertrophy resulting in mild bilateral foraminal stenosis. No canal stenosis.  IMPRESSION: 1. Advanced cervical spondylosis with mildly progressed at C6-7 and C7-T1. 2. 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 3. Stable severe right C3-4 and severe left C4-5 foraminal stenosis. Multilevel mild and moderate foraminal stenosis. 4. No acute osseous abnormality. 5. Enlarged right posterior cervical chain lymph node at C4-5 level, probably reactive lymphadenopathy, clinical correlation  recommended.   Electronically Signed   By: Kristine Garbe M.D.   On: 07/25/2017 17:36   She reports that she has been smoking cigarettes. She has a 20.00 pack-year smoking history. She has never used smokeless tobacco.  Recent Labs    09/01/17 0852 04/25/18 0941  HGBA1C 6.5* 5.6    Objective:  VS:  HT:    WT:   BMI:     BP:   HR: bpm  TEMP: ( )  RESP:  Physical Exam Musculoskeletal:        General: No swelling, tenderness or deformity.     Comments: Inspection reveals no atrophy of the bilateral APB or FDI or hand intrinsics. There is no swelling, color changes, allodynia or dystrophic changes. There is 5 out of 5 strength in the bilateral wrist extension, finger abduction and long finger flexion. There is intact sensation to light touch in all dermatomal and peripheral nerve distributions.  There is a negative Hoffmann's test bilaterally.  There is pain with any palpation of the forearm and elbow and even movement with rotation.  Skin:    General: Skin is warm and dry.     Findings: No erythema or rash.  Neurological:     General: No focal deficit present.     Mental Status: She is alert and oriented to person, place, and time.     Motor: No weakness or abnormal muscle tone.     Coordination: Coordination normal.  Psychiatric:        Mood and Affect: Mood normal.        Behavior: Behavior normal.     Ortho Exam Imaging: No results found.  Past Medical/Family/Surgical/Social History: Medications & Allergies reviewed per EMR, new medications updated. Patient Active Problem List   Diagnosis Date Noted  . Controlled type 2 diabetes mellitus without complication, without long-term current use of insulin ([place]) 11/15/2017  . Lumbar radiculopathy, chronic 11/15/2017  . Increased endometrial stripe thickness 09/08/2016  . Chronic right shoulder  pain 07/22/2016  . COPD GOLD 0 still smoking  06/18/2016  . HPV test positive 10/13/2015  . Mild diastolic  dysfunction 70/03/7492  . Chronic diarrhea 08/13/2015  . Cigarette smoker 08/13/2015  . Chronic back pain 03/15/2013  . Essential hypertension, benign 03/15/2013  . History of substance abuse ([place]) 03/15/2013   Past Medical History:  Diagnosis Date  . Diabetes mellitus without complication ([place])   . Hypertension Dx 2015  . Obesity   . PMB (postmenopausal bleeding)    Family History  Problem Relation Age of Onset  . COPD Mother   . Stroke Mother   . Heart disease Mother   . Hypertension Mother   . Cancer Sister 25       cervical cancer   . Stroke Maternal Grandmother    Past Surgical History:  Procedure Laterality Date  . ABDOMINAL AORTOGRAM N/A 07/12/2016   Procedure: Abdominal Aortogram;  Surgeon: Jettie Booze, MD;  Location: [facility];  Service: Cardiovascular;  Laterality: N/A;  . ANTERIOR CERVICAL DECOMP/DISCECTOMY FUSION N/A 09/05/2017   Procedure: C4-5, C5-6 ANTERIOR CERVICAL DECOMPRESSION/DISCECTOMY FUSION, ALLOGRAFT, PLATE;  Surgeon: Marybelle Killings, MD;  Location: [facility];  Service: Orthopedics;  Laterality: N/A;  . HAND SURGERY    . LEFT HEART CATH AND CORONARY ANGIOGRAPHY N/A 07/12/2016   Procedure: Left Heart Cath and Coronary Angiography;  Surgeon: Jettie Booze, MD;  Location: [facility];  Service: Cardiovascular;  Laterality: N/A;  . TUBAL LIGATION     Social History   Occupational History  . Not on file  Tobacco Use  . Smoking status: Current Every Day Smoker    Packs/day: 0.50    Years: 40.00    Pack years: 20.00    Types: Cigarettes  . Smokeless tobacco: Never Used  Substance and Sexual Activity  . Alcohol use: No    Alcohol/week: 0.0 standard drinks  . Drug use: No  . Sexual activity: Not Currently    Birth control/protection: None

## 2018-06-06 ENCOUNTER — Encounter (INDEPENDENT_AMBULATORY_CARE_PROVIDER_SITE_OTHER): Payer: Self-pay | Admitting: Orthopaedic Surgery

## 2018-06-06 ENCOUNTER — Ambulatory Visit (INDEPENDENT_AMBULATORY_CARE_PROVIDER_SITE_OTHER): Payer: Medicaid Other | Admitting: Orthopaedic Surgery

## 2018-06-06 VITALS — BP 149/81 | HR 59 | Ht 63.0 in | Wt 163.0 lb

## 2018-06-06 DIAGNOSIS — M79602 Pain in left arm: Secondary | ICD-10-CM | POA: Diagnosis not present

## 2018-06-06 NOTE — Progress Notes (Signed)
Office Visit Note   Patient: Charlene Allen           Date of Birth: 26-Jun-1955           MRN: [id number] Visit Date: 06/06/2018              Requested by: Ladell Pier, MD [address] [place], [place] [address] PCP: Ladell Pier, MD   Assessment & Plan: Visit Diagnoses:  1. Left arm pain     Plan: We reviewed electrical studies which were normal other than mild left carpal tunnel syndrome for which she previously had surgery.  I will recheck her in 6 months.  She did have some spondylosis mild to moderate at C6-7 which was not surgically addressed since it was not as severe as the other 2 levels.  She is gotten some improvement since last month and hopefully her symptoms will resolve.  Recheck 6 months.  Follow-Up Instructions: Return in about 6 months (around 12/07/2018).   Orders:  No orders of the defined types were placed in this encounter.  No orders of the defined types were placed in this encounter.     Procedures: No procedures performed   Clinical Data: No additional findings.   Subjective: Chief Complaint  Patient presents with  . Follow-up    review EMG/NCV    HPI 63 year old female returns with ongoing problems with left arm pain over the cubital tunnel and somewhat over the medial epicondyle.  EMGs nerve conduction velocities were negative for cubital tunnel she did have mild carpal tunnel changes on nerve conduction velocities but is had previous carpal tunnel release in 1998.  Cervical fusion was done 2019 C4-5 C5-6 without motion.  States her neck is feeling much better than it was 2 weeks ago.  Patient not working.  Review of Systems 14 point systems updated noncontributory of note is COPD Gold still smoking history of substance abuse type 2 diabetes and previous cervical fusion.  Cervical fusion C4-5, C5-6 was on 09/05/2017.  Objective: Vital Signs: BP (!) 149/81   Pulse (!) 59   Ht 5\' 3"  (1.6 m)   Wt 163 lb (73.9 kg)   BMI 28.87  kg/m   Physical Exam Constitutional:      Appearance: She is well-developed.  HENT:     Head: Normocephalic.     Right Ear: External ear normal.     Left Ear: External ear normal.  Eyes:     Pupils: Pupils are equal, round, and reactive to light.  Neck:     Thyroid: No thyromegaly.     Trachea: No tracheal deviation.  Cardiovascular:     Rate and Rhythm: Normal rate.  Pulmonary:     Effort: Pulmonary effort is normal.  Abdominal:     Palpations: Abdomen is soft.  Skin:    General: Skin is warm and dry.  Neurological:     Mental Status: She is alert and oriented to person, place, and time.  Psychiatric:        Behavior: Behavior normal.     Ortho Exam carpal tunnel incisions on left and right.  Minimal tenderness medial epicondyle unchanged with gripping.  She still has positive percussion over the ulnar nerve in the cubital tunnel on the left negative on the right no thenar or hyperthenar atrophy.  FCU is strong.  Sensation of the ring and small fingers normal.  Minimal limitation cervical range of motion without discomfort.  Brachial plexus palpation is negative on the  left.  Specialty Comments:  No specialty comments available.  Imaging: No results found.   PMFS History: Patient Active Problem List   Diagnosis Date Noted  . Controlled type 2 diabetes mellitus without complication, without long-term current use of insulin ([place]) 11/15/2017  . Lumbar radiculopathy, chronic 11/15/2017  . Increased endometrial stripe thickness 09/08/2016  . Chronic right shoulder pain 07/22/2016  . COPD GOLD 0 still smoking  06/18/2016  . HPV test positive 10/13/2015  . Mild diastolic dysfunction 72/53/6644  . Chronic diarrhea 08/13/2015  . Cigarette smoker 08/13/2015  . Chronic back pain 03/15/2013  . Essential hypertension, benign 03/15/2013  . History of substance abuse ([place]) 03/15/2013   Past Medical History:  Diagnosis Date  . Diabetes mellitus without complication ([place])     . Hypertension Dx 2015  . Obesity   . PMB (postmenopausal bleeding)     Family History  Problem Relation Age of Onset  . COPD Mother   . Stroke Mother   . Heart disease Mother   . Hypertension Mother   . Cancer Sister 25       cervical cancer   . Stroke Maternal Grandmother     Past Surgical History:  Procedure Laterality Date  . ABDOMINAL AORTOGRAM N/A 07/12/2016   Procedure: Abdominal Aortogram;  Surgeon: Jettie Booze, MD;  Location: [facility];  Service: Cardiovascular;  Laterality: N/A;  . ANTERIOR CERVICAL DECOMP/DISCECTOMY FUSION N/A 09/05/2017   Procedure: C4-5, C5-6 ANTERIOR CERVICAL DECOMPRESSION/DISCECTOMY FUSION, ALLOGRAFT, PLATE;  Surgeon: Marybelle Killings, MD;  Location: [facility];  Service: Orthopedics;  Laterality: N/A;  . HAND SURGERY    . LEFT HEART CATH AND CORONARY ANGIOGRAPHY N/A 07/12/2016   Procedure: Left Heart Cath and Coronary Angiography;  Surgeon: Jettie Booze, MD;  Location: [facility];  Service: Cardiovascular;  Laterality: N/A;  . TUBAL LIGATION     Social History   Occupational History  . Not on file  Tobacco Use  . Smoking status: Current Every Day Smoker    Packs/day: 0.50    Years: 40.00    Pack years: 20.00    Types: Cigarettes  . Smokeless tobacco: Never Used  Substance and Sexual Activity  . Alcohol use: No    Alcohol/week: 0.0 standard drinks  . Drug use: No  . Sexual activity: Not Currently    Birth control/protection: None

## 2018-06-09 ENCOUNTER — Other Ambulatory Visit: Payer: Self-pay | Admitting: Physician Assistant

## 2018-06-19 ENCOUNTER — Telehealth: Payer: Self-pay | Admitting: Internal Medicine

## 2018-06-19 ENCOUNTER — Other Ambulatory Visit: Payer: Self-pay | Admitting: Internal Medicine

## 2018-06-19 DIAGNOSIS — M5416 Radiculopathy, lumbar region: Secondary | ICD-10-CM

## 2018-06-19 MED ORDER — ACETAMINOPHEN-CODEINE #4 300-60 MG PO TABS: 1.0000 | ORAL_TABLET | Freq: Four times a day (QID) | ORAL | 0 refills | Status: DC | PRN

## 2018-06-19 NOTE — Telephone Encounter (Signed)
[place] to call in Tylenol 4 Rx. Spoke to [employer]  Name: Tylenol 4 Strength: 300-60MG  Sig: Take 1 tablet ever 6 hours prn for moderate pain(fill on or after 06/22/18) Quantity: 120 Refills: 0

## 2018-06-19 NOTE — Telephone Encounter (Signed)
New Message   1) Medication(s) Requested (by name): amLODipine (NORVASC) 10 MG tablet and acetaminophen-codeine (TYLENOL #4) 300-60 MG tablet  2) Pharmacy of Choice: [facility], [place]  3) Special Requests: Pt appt  was cancelled per provider but pt needs medication   Approved medications will be sent to the pharmacy, we will reach out if there is an issue.  Requests made after 3pm may not be addressed until the following business day!  If a patient is unsure of the name of the medication(s) please note and ask patient to call back when they are able to provide all info, do not send to responsible party until all information is available!

## 2018-06-19 NOTE — Telephone Encounter (Signed)
Patient has plenty of refills on amlodipine with her pharmacy.   Pt last seen: 04/25/18 Next appt: n/a Last RX written on: 04/25/18 Date of original fill: 05/24/18 Date of refill(s): n/a  No other controlled substances were filled during this time, please refill if appropriate.

## 2018-06-20 ENCOUNTER — Encounter: Payer: Medicaid Other | Admitting: Family Medicine

## 2018-06-22 NOTE — Telephone Encounter (Signed)
Returned to pt call and informed her that rx was called in 3 days ago and informed pt that I spoke to [employer]. Pt states she doesn't have any questions and that she will contact the [facility] again

## 2018-06-22 NOTE — Telephone Encounter (Signed)
Patient called stating that she spoke with the pharmacy regarding her tylenol #4 and was told it has not been received. Please follow up.

## 2018-07-10 ENCOUNTER — Telehealth: Payer: Self-pay | Admitting: Internal Medicine

## 2018-07-10 NOTE — Telephone Encounter (Signed)
Apt made with [facility]

## 2018-07-10 NOTE — Telephone Encounter (Signed)
Patient would like to be prescribed an antibiotic for her tooth. Patient believes she has an infection. Please follow up.

## 2018-07-10 NOTE — Telephone Encounter (Signed)
Will forward to pcp

## 2018-07-12 ENCOUNTER — Other Ambulatory Visit: Payer: Self-pay

## 2018-07-12 ENCOUNTER — Ambulatory Visit: Payer: Medicaid Other | Attending: Primary Care | Admitting: Primary Care

## 2018-07-12 ENCOUNTER — Encounter: Payer: Self-pay | Admitting: Primary Care

## 2018-07-12 DIAGNOSIS — Z8249 Family history of ischemic heart disease and other diseases of the circulatory system: Secondary | ICD-10-CM | POA: Diagnosis not present

## 2018-07-12 DIAGNOSIS — Z888 Allergy status to other drugs, medicaments and biological substances status: Secondary | ICD-10-CM | POA: Diagnosis not present

## 2018-07-12 DIAGNOSIS — Z79899 Other long term (current) drug therapy: Secondary | ICD-10-CM | POA: Diagnosis not present

## 2018-07-12 DIAGNOSIS — Z7984 Long term (current) use of oral hypoglycemic drugs: Secondary | ICD-10-CM | POA: Insufficient documentation

## 2018-07-12 DIAGNOSIS — E669 Obesity, unspecified: Secondary | ICD-10-CM | POA: Insufficient documentation

## 2018-07-12 DIAGNOSIS — F17210 Nicotine dependence, cigarettes, uncomplicated: Secondary | ICD-10-CM | POA: Insufficient documentation

## 2018-07-12 DIAGNOSIS — Z8541 Personal history of malignant neoplasm of cervix uteri: Secondary | ICD-10-CM | POA: Insufficient documentation

## 2018-07-12 DIAGNOSIS — I10 Essential (primary) hypertension: Secondary | ICD-10-CM

## 2018-07-12 DIAGNOSIS — E119 Type 2 diabetes mellitus without complications: Secondary | ICD-10-CM | POA: Insufficient documentation

## 2018-07-12 DIAGNOSIS — K047 Periapical abscess without sinus: Secondary | ICD-10-CM | POA: Diagnosis not present

## 2018-07-12 MED ORDER — LOSARTAN POTASSIUM 25 MG PO TABS: 25.0000 mg | ORAL_TABLET | Freq: Every day | ORAL | 3 refills | Status: DC

## 2018-07-12 MED ORDER — AMOXICILLIN-POT CLAVULANATE 875-125 MG PO TABS: 1.0000 | ORAL_TABLET | Freq: Two times a day (BID) | ORAL | 0 refills | Status: DC

## 2018-07-12 MED ORDER — CHLORHEXIDINE GLUCONATE 0.12 % MT SOLN: 15.0000 mL | Freq: Two times a day (BID) | OROMUCOSAL | 0 refills | Status: DC

## 2018-07-12 MED ORDER — AMLODIPINE BESYLATE 10 MG PO TABS: 10.0000 mg | ORAL_TABLET | Freq: Every day | ORAL | 3 refills | Status: DC

## 2018-07-12 NOTE — Progress Notes (Signed)
Patient verified DOB Patient has taken medication and has eaten today. Patient complains of tooth pain being at a 9 for the past week.. molar on left bottom.  Patient complains of chronic back pain which is achy.

## 2018-07-12 NOTE — Progress Notes (Signed)
Acute Office Visit  Subjective:    Patient ID: Charlene Allen, female    DOB: 1955/09/08, 63 y.o.   MRN: [id number]  Chief Complaint  Patient presents with  . Dental Pain   HPI: Patient has a tele visit for tooth pain and possible infection. The left bottom molar has broken off and a hole in the center per patient , foul odor, and taste. Sensitively to temperature change. This has been a ongoing problem she states when she goes to the dentist she has a infection and unable to have it removed. Spent over 20 mins with patient   Past Medical History:  Diagnosis Date  . Diabetes mellitus without complication ([place])   . Hypertension Dx 2015  . Obesity   . PMB (postmenopausal bleeding)     Past Surgical History:  Procedure Laterality Date  . ABDOMINAL AORTOGRAM N/A 07/12/2016   Procedure: Abdominal Aortogram;  Surgeon: Jettie Booze, MD;  Location: [facility];  Service: Cardiovascular;  Laterality: N/A;  . ANTERIOR CERVICAL DECOMP/DISCECTOMY FUSION N/A 09/05/2017   Procedure: C4-5, C5-6 ANTERIOR CERVICAL DECOMPRESSION/DISCECTOMY FUSION, ALLOGRAFT, PLATE;  Surgeon: Marybelle Killings, MD;  Location: [facility];  Service: Orthopedics;  Laterality: N/A;  . HAND SURGERY    . LEFT HEART CATH AND CORONARY ANGIOGRAPHY N/A 07/12/2016   Procedure: Left Heart Cath and Coronary Angiography;  Surgeon: Jettie Booze, MD;  Location: [facility];  Service: Cardiovascular;  Laterality: N/A;  . TUBAL LIGATION      Family History  Problem Relation Age of Onset  . COPD Mother   . Stroke Mother   . Heart disease Mother   . Hypertension Mother   . Cancer Sister 25       cervical cancer   . Stroke Maternal Grandmother     Social History   Socioeconomic History  . Marital status: Married    Spouse name: Not on file  . Number of children: Not on file  . Years of education: Not on file  . Highest education level: Not on file  Occupational History  . Not on file  Social Needs   . Financial resource strain: Not on file  . Food insecurity:    Worry: Not on file    Inability: Not on file  . Transportation needs:    Medical: Not on file    Non-medical: Not on file  Tobacco Use  . Smoking status: Current Every Day Smoker    Packs/day: 0.50    Years: 40.00    Pack years: 20.00    Types: Cigarettes  . Smokeless tobacco: Never Used  Substance and Sexual Activity  . Alcohol use: No    Alcohol/week: 0.0 standard drinks  . Drug use: No  . Sexual activity: Not Currently    Birth control/protection: None  Lifestyle  . Physical activity:    Days per week: Not on file    Minutes per session: Not on file  . Stress: Not on file  Relationships  . Social connections:    Talks on phone: Not on file    Gets together: Not on file    Attends religious service: Not on file    Active member of club or organization: Not on file    Attends meetings of clubs or organizations: Not on file    Relationship status: Not on file  . Intimate partner violence:    Fear of current or ex partner: Not on file    Emotionally abused:  Not on file    Physically abused: Not on file    Forced sexual activity: Not on file  Other Topics Concern  . Not on file  Social History Narrative  . Not on file    Outpatient Medications Prior to Visit  Medication Sig Dispense Refill  . acetaminophen-codeine (TYLENOL #4) 300-60 MG tablet Take 1 tablet by mouth every 6 (six) hours as needed for moderate pain (Fill on or after 06/22/2018). 120 tablet 0  . albuterol (PROVENTIL HFA;VENTOLIN HFA) 108 (90 Base) MCG/ACT inhaler Inhale 2 puffs into the lungs every 6 (six) hours as needed for wheezing or shortness of breath. 1 Inhaler 3  . atorvastatin (LIPITOR) 20 MG tablet Take 1 tablet (20 mg total) by mouth daily. 90 tablet 1  . Blood Glucose Monitoring Suppl (TRUE METRIX METER) w/Device KIT Use as directed 1 kit 0  . budesonide-formoterol (SYMBICORT) 80-4.5 MCG/ACT inhaler Take 2 puffs first thing in am  and then another 2 puffs about 12 hours later. 1 Inhaler 11  . fluticasone (FLONASE) 50 MCG/ACT nasal spray Place 1 spray into both nostrils daily as needed for allergies or rhinitis.    Marland Kitchen glucose blood (TRUE METRIX BLOOD GLUCOSE TEST) test strip Use as instructed 100 each 12  . hydrochlorothiazide (HYDRODIURIL) 25 MG tablet Take 1 tablet (25 mg total) by mouth daily. 30 tablet 6  . metFORMIN (GLUCOPHAGE) 500 MG tablet Take 1 tablet (500 mg total) by mouth 2 (two) times daily with a meal. 60 tablet 2  . methocarbamol (ROBAXIN) 500 MG tablet Take 1 tablet (500 mg total) by mouth every 8 (eight) hours as needed for muscle spasms. 40 tablet 0  . nitroGLYCERIN (NITROSTAT) 0.4 MG SL tablet Place 1 tablet (0.4 mg total) under the tongue every 5 (five) minutes as needed for chest pain.  12  . potassium chloride SA (K-DUR,KLOR-CON) 20 MEQ tablet TAKE ONE TABLET BY MOUTH EVERY DAY 30 tablet 6  . pregabalin (LYRICA) 75 MG capsule Take 1 capsule (75 mg total) by mouth 3 (three) times daily. For PASS 270 capsule 1  . TRUEPLUS LANCETS 28G MISC Use as directed 100 each 12  . acetaminophen-codeine (TYLENOL #4) 300-60 MG tablet Take 1 tablet by mouth every 6 (six) hours as needed for moderate pain. Fill on or after 05/25/2018 120 tablet 0  . amLODipine (NORVASC) 10 MG tablet Take 1 tablet (10 mg total) by mouth daily. 30 tablet 6  . lisinopril (PRINIVIL,ZESTRIL) 5 MG tablet Take 5 mg by mouth daily.    Marland Kitchen losartan (COZAAR) 25 MG tablet Take 1 tablet (25 mg total) by mouth daily. 30 tablet 6  . nitroGLYCERIN (NITROSTAT) 0.4 MG SL tablet Place 1 tablet (0.4 mg total) under the tongue every 5 (five) minutes as needed for chest pain. If you need more than 2 doses call 911 20 tablet 1  . nicotine (NICODERM CQ - DOSED IN MG/24 HOURS) 14 mg/24hr patch Place 1 patch (14 mg total) onto the skin daily. (Patient not taking: Reported on 07/12/2018) 28 patch 0  . Potassium Chloride ER 20 MEQ TBCR Take 20 mEq by mouth daily. 30  tablet 5   No facility-administered medications prior to visit.     Allergies  Allergen Reactions  . Lisinopril Nausea Only    Review of Systems  Constitutional: Negative.   HENT: Negative.        Left lower molar bottom decaying (per phone visit)  Eyes: Negative.   Respiratory: Negative.   Cardiovascular:  Negative.   Gastrointestinal: Negative.   Genitourinary: Negative.   Musculoskeletal: Negative.   Skin: Negative.        Objective:    Physical Exam  There were no vitals taken for this visit. Wt Readings from Last 3 Encounters:  06/06/18 163 lb (73.9 kg)  04/26/18 164 lb (74.4 kg)  04/25/18 164 lb 3.2 oz (74.5 kg)    Health Maintenance Due  Topic Date Due  . FOOT EXAM  01/25/1967  . OPHTHALMOLOGY EXAM  01/25/1967  . MAMMOGRAM  01/25/2007  . COLONOSCOPY  01/25/2007  . PAP SMEAR-Modifier  08/16/2017    There are no preventive care reminders to display for this patient.   Lab Results  Component Value Date   TSH 2.408 10/05/2013   Lab Results  Component Value Date   WBC 10.1 08/29/2017   HGB 13.3 09/05/2017   HCT 39.0 09/05/2017   MCV 84.1 08/29/2017   PLT 407 (H) 08/29/2017   Lab Results  Component Value Date   NA 141 09/05/2017   K 3.1 (L) 09/05/2017   CO2 27 08/29/2017   GLUCOSE 120 (H) 09/05/2017   BUN 9 08/29/2017   CREATININE 0.75 08/29/2017   BILITOT 0.5 08/29/2017   ALKPHOS 94 08/29/2017   AST 22 08/29/2017   ALT 22 08/29/2017   PROT 7.6 08/29/2017   ALBUMIN 4.2 08/29/2017   CALCIUM 9.6 08/29/2017   ANIONGAP 14 08/29/2017   Lab Results  Component Value Date   CHOL 197 08/12/2015   Lab Results  Component Value Date   HDL 42 (L) 08/12/2015   Lab Results  Component Value Date   LDLCALC 111 08/12/2015   Lab Results  Component Value Date   TRIG 218 (H) 08/12/2015   Lab Results  Component Value Date   CHOLHDL 4.7 08/12/2015   Lab Results  Component Value Date   HGBA1C 5.6 04/25/2018       Assessment & Plan:    Problem List Items Addressed This Visit    None    Visit Diagnoses    Infection of tooth    -  Primary   Essential hypertension       Relevant Medications   amLODipine (NORVASC) 10 MG tablet   losartan (COZAAR) 25 MG tablet     Fronie was seen today for dental pain.  Diagnoses and all orders for this visit:  Infection of tooth This is a chronic problem. The current episode started more than 1 month ago. The problem occurs constantly. The problem has been gradually worsening. The pain is at a severity of 9/10. The pain is severe. Associated symptoms include facial pain and thermal sensitivity. She has tried acetaminophen (kanka pen for numbing ) for the symptoms. The treatment provided no relief.  Antibiotics  Sent. Essential hypertension Medication refilled f/u appt previously scheduled with PCP.  Opt not to refill diuretics until lab visit. -     amLODipine (NORVASC) 10 MG tablet; Take 1 tablet (10 mg total) by mouth daily. -     losartan (COZAAR) 25 MG tablet; Take 1 tablet (25 mg total) by mouth daily.  Other orders -     amoxicillin-clavulanate (AUGMENTIN) 875-125 MG tablet; Take 1 tablet by mouth 2 (two) times daily.    No orders of the defined types were placed in this encounter.    Kerin Perna, NP

## 2018-07-20 ENCOUNTER — Other Ambulatory Visit: Payer: Self-pay | Admitting: Internal Medicine

## 2018-07-20 DIAGNOSIS — M5416 Radiculopathy, lumbar region: Secondary | ICD-10-CM

## 2018-07-20 NOTE — Telephone Encounter (Signed)
Asked Lurena Joiner to see when pt last filled Tylenol 4.  Per Lurena Joiner pt filled Tylenol 4 on 06/22/18 for a quantity of 120 for a 30 day supply.

## 2018-07-20 NOTE — Telephone Encounter (Signed)
Patient called to request a refill of her acetaminophen-codeine (TYLENOL #4) 300-60 MG tablet [[id number]] Patient would also like a refill of  amoxicillin-clavulanate (AUGMENTIN) 875-125 MG tablet [[id number]] patient states her tooth pain has not gotten better and that she would like a stronger dose, Please follow up.

## 2018-07-20 NOTE — Telephone Encounter (Signed)
Sharyn Lull seen pt for tooth pain on 07/12/18. Will forward to [facility]

## 2018-07-21 MED ORDER — ACETAMINOPHEN-CODEINE #4 300-60 MG PO TABS: 1.0000 | ORAL_TABLET | Freq: Four times a day (QID) | ORAL | 0 refills | Status: DC | PRN

## 2018-07-21 NOTE — Telephone Encounter (Signed)
Pt name and DOB verified. Informed that Rx was sent to [facility]. She was informed of message per Dr. Margarita Rana. Advised to call a dentist. Given phone number to dentistry on [email or web address]  Pt states no dentist were open.

## 2018-07-21 NOTE — Telephone Encounter (Signed)
Refilled Tylenol No. 4.  The antibiotic she received for her tooth 9 days ago is a strong antibiotic.  If she still has pain she would need to see a dentist.

## 2018-08-10 ENCOUNTER — Other Ambulatory Visit: Payer: Medicaid Other | Admitting: Internal Medicine

## 2018-08-10 ENCOUNTER — Telehealth: Payer: Self-pay | Admitting: Internal Medicine

## 2018-08-10 DIAGNOSIS — M5416 Radiculopathy, lumbar region: Secondary | ICD-10-CM

## 2018-08-10 MED ORDER — ACETAMINOPHEN-CODEINE #4 300-60 MG PO TABS: 1.0000 | ORAL_TABLET | Freq: Four times a day (QID) | ORAL | 0 refills | Status: DC | PRN

## 2018-08-10 NOTE — Telephone Encounter (Signed)
1) Medication(s) Requested (by name): Tylenol 4 Amlodipine  2) Pharmacy of Choice: [facility] 3) Special Requests:   Approved medications will be sent to the pharmacy, we will reach out if there is an issue.  Requests made after 3pm may not be addressed until the following business day!  If a patient is unsure of the name of the medication(s) please note and ask patient to call back when they are able to provide all info, do not send to responsible party until all information is available!

## 2018-08-10 NOTE — Telephone Encounter (Signed)
Called and lvm for ot to call back

## 2018-08-10 NOTE — Telephone Encounter (Signed)
Could you call pt and schedule an appointment and make aware rx is ready for pickup

## 2018-08-24 ENCOUNTER — Ambulatory Visit: Payer: Medicaid Other | Attending: Internal Medicine | Admitting: Internal Medicine

## 2018-08-24 ENCOUNTER — Encounter: Payer: Self-pay | Admitting: Internal Medicine

## 2018-08-24 ENCOUNTER — Other Ambulatory Visit: Payer: Self-pay

## 2018-08-24 DIAGNOSIS — G8929 Other chronic pain: Secondary | ICD-10-CM | POA: Diagnosis not present

## 2018-08-24 DIAGNOSIS — Z7951 Long term (current) use of inhaled steroids: Secondary | ICD-10-CM | POA: Insufficient documentation

## 2018-08-24 DIAGNOSIS — Z981 Arthrodesis status: Secondary | ICD-10-CM | POA: Diagnosis not present

## 2018-08-24 DIAGNOSIS — N852 Hypertrophy of uterus: Secondary | ICD-10-CM

## 2018-08-24 DIAGNOSIS — I10 Essential (primary) hypertension: Secondary | ICD-10-CM | POA: Diagnosis not present

## 2018-08-24 DIAGNOSIS — F17210 Nicotine dependence, cigarettes, uncomplicated: Secondary | ICD-10-CM | POA: Diagnosis not present

## 2018-08-24 DIAGNOSIS — F17200 Nicotine dependence, unspecified, uncomplicated: Secondary | ICD-10-CM | POA: Diagnosis not present

## 2018-08-24 DIAGNOSIS — Z79899 Other long term (current) drug therapy: Secondary | ICD-10-CM | POA: Insufficient documentation

## 2018-08-24 DIAGNOSIS — E669 Obesity, unspecified: Secondary | ICD-10-CM | POA: Insufficient documentation

## 2018-08-24 DIAGNOSIS — J449 Chronic obstructive pulmonary disease, unspecified: Secondary | ICD-10-CM | POA: Insufficient documentation

## 2018-08-24 DIAGNOSIS — M5416 Radiculopathy, lumbar region: Secondary | ICD-10-CM | POA: Insufficient documentation

## 2018-08-24 DIAGNOSIS — M5412 Radiculopathy, cervical region: Secondary | ICD-10-CM | POA: Insufficient documentation

## 2018-08-24 DIAGNOSIS — E119 Type 2 diabetes mellitus without complications: Secondary | ICD-10-CM | POA: Diagnosis not present

## 2018-08-24 DIAGNOSIS — Z7984 Long term (current) use of oral hypoglycemic drugs: Secondary | ICD-10-CM | POA: Insufficient documentation

## 2018-08-24 MED ORDER — LOSARTAN POTASSIUM 50 MG PO TABS: 50.0000 mg | ORAL_TABLET | Freq: Every day | ORAL | 1 refills | Status: DC

## 2018-08-24 MED ORDER — PREGABALIN 75 MG PO CAPS: 75.0000 mg | ORAL_CAPSULE | Freq: Three times a day (TID) | ORAL | 1 refills | Status: DC

## 2018-08-24 MED ORDER — NICOTINE 14 MG/24HR TD PT24: 14.0000 mg | MEDICATED_PATCH | Freq: Every day | TRANSDERMAL | 1 refills | Status: DC

## 2018-08-24 NOTE — Progress Notes (Signed)
Pt hasn't checked blood sugar or bp  today

## 2018-08-24 NOTE — Progress Notes (Signed)
Virtual Visit via Telephone Note Due to current restrictions/limitations of in-office visits due to the COVID-19 pandemic, this scheduled clinical appointment was converted to a telehealth visit  I connected with Charlene Allen on 08/24/18 at 3:54 p.m by telephone and verified that I am speaking with the correct person using two identifiers. I am in my office.  The patient is at home.  Only the patient and myself participated in this encounter.  I discussed the limitations, risks, security and privacy concerns of performing an evaluation and management service by telephone and the availability of in person appointments. I also discussed with the patient that there may be a patient responsible charge related to this service. The patient expressed understanding and agreed to proceed.   History of Present Illness: Hx of chronic neck/lower back pain on Tylenol #4, Lyrica), HTN,DM, HL,tob, obesity, COPD, depression, S/p c-spine fusion 2018.   I last saw her 03/2018.  Missed her follow-up appointment with me in March to have her Pap smear done.  Still having pain in LT arm for which she has seen Dr. [name].  EMG revealed mild carpal tunnel syndrome. He stated that she does have some spondylosis mild to moderate at C6-7 level which was not surgically addressed as it was not as severe as the other 2 levels.  She reports some improvement with Tylenol with codeine and Lyrica.  Chronic LBP:  Feels back getting worse.  " Aches like a tooth ache."  Worse with all the rain we have been experiencing.  Had to stay on heating pad all yesterday and today.  She tries not to lay around all day. -pain radiates to LT hip -gets some relief with Tyl#4 - without med pain is 8-9/10; drops to 5/10 when she does.  Lyrica also also helps.  She is able to function in her house. -On last visit with me in January, the plan was for her to discuss her back issues and the MRI results with Dr. [name] but she did not do that.  No  showed for pelvic US ordered on last visit.  She states she did not go because of the [place] pandemic.  However I pointed out that the appt was 05/01/2018 before pandemic became a major issue in the hospital was still open at that time.  Stressed importance of [place] following up on the abnormal incidental finding of an enlarged uterus seen on the MRI of the lumbar spine.  Wants to wait another mth before getting it done stating she is afraid of going anywhere near the hospital during this pandemic  HTN:  Checked BP last wk and it was  SBP 185.  Next day it was 186/85 at her doctor's office.  She reports compliance with medications.  Tob dep:  Down to 1 cig/day.  Trying to quit.  Would like more patches  COPD:  Last saw Dr. Melvyn Novas 05/2016.  States she was told she did not have COPD.  However his note states that she has COPD mixed type and she was kept on Symbicort.  Uses Symbicort QOD.  Reports breathing most of the time is pretty good except when she tries to do too much  Social History   Socioeconomic History  . Marital status: Married    Spouse name: Not on file  . Number of children: Not on file  . Years of education: Not on file  . Highest education level: Not on file  Occupational History  . Not on file  Social Needs  .  Financial resource strain: Not on file  . Food insecurity:    Worry: Not on file    Inability: Not on file  . Transportation needs:    Medical: Not on file    Non-medical: Not on file  Tobacco Use  . Smoking status: Current Every Day Smoker    Packs/day: 0.50    Years: 40.00    Pack years: 20.00    Types: Cigarettes  . Smokeless tobacco: Never Used  Substance and Sexual Activity  . Alcohol use: No    Alcohol/week: 0.0 standard drinks  . Drug use: No  . Sexual activity: Not Currently    Birth control/protection: None  Lifestyle  . Physical activity:    Days per week: Not on file    Minutes per session: Not on file  . Stress: Not on file  Relationships  .  Social connections:    Talks on phone: Not on file    Gets together: Not on file    Attends religious service: Not on file    Active member of club or organization: Not on file    Attends meetings of clubs or organizations: Not on file    Relationship status: Not on file  . Intimate partner violence:    Fear of current or ex partner: Not on file    Emotionally abused: Not on file    Physically abused: Not on file    Forced sexual activity: Not on file  Other Topics Concern  . Not on file  Social History Narrative  . Not on file   Outpatient Encounter Medications as of 08/24/2018  Medication Sig  . acetaminophen-codeine (TYLENOL #4) 300-60 MG tablet Take 1 tablet by mouth every 6 (six) hours as needed for moderate pain (Fill on or after 06/22/2018).  Marland Kitchen albuterol (PROVENTIL HFA;VENTOLIN HFA) 108 (90 Base) MCG/ACT inhaler Inhale 2 puffs into the lungs every 6 (six) hours as needed for wheezing or shortness of breath.  Marland Kitchen amLODipine (NORVASC) 10 MG tablet Take 1 tablet (10 mg total) by mouth daily.  Marland Kitchen atorvastatin (LIPITOR) 20 MG tablet Take 1 tablet (20 mg total) by mouth daily.  . Blood Glucose Monitoring Suppl (TRUE METRIX METER) w/Device KIT Use as directed  . budesonide-formoterol (SYMBICORT) 80-4.5 MCG/ACT inhaler Take 2 puffs first thing in am and then another 2 puffs about 12 hours later.  . chlorhexidine (PERIDEX) 0.12 % solution Use as directed 15 mLs in the mouth or throat 2 (two) times daily.  . fluticasone (FLONASE) 50 MCG/ACT nasal spray Place 1 spray into both nostrils daily as needed for allergies or rhinitis.  Marland Kitchen glucose blood (TRUE METRIX BLOOD GLUCOSE TEST) test strip Use as instructed  . hydrochlorothiazide (HYDRODIURIL) 25 MG tablet Take 1 tablet (25 mg total) by mouth daily.  Marland Kitchen losartan (COZAAR) 50 MG tablet Take 1 tablet (50 mg total) by mouth daily.  . metFORMIN (GLUCOPHAGE) 500 MG tablet Take 1 tablet (500 mg total) by mouth 2 (two) times daily with a meal.  .  methocarbamol (ROBAXIN) 500 MG tablet Take 1 tablet (500 mg total) by mouth every 8 (eight) hours as needed for muscle spasms.  . nicotine (NICODERM CQ - DOSED IN MG/24 HOURS) 14 mg/24hr patch Place 1 patch (14 mg total) onto the skin daily.  . nitroGLYCERIN (NITROSTAT) 0.4 MG SL tablet Place 1 tablet (0.4 mg total) under the tongue every 5 (five) minutes as needed for chest pain.  . potassium chloride SA (K-DUR,KLOR-CON) 20 MEQ tablet TAKE ONE  TABLET BY MOUTH EVERY DAY  . pregabalin (LYRICA) 75 MG capsule Take 1 capsule (75 mg total) by mouth 3 (three) times daily.  . TRUEPLUS LANCETS 28G MISC Use as directed  . [DISCONTINUED] amoxicillin-clavulanate (AUGMENTIN) 875-125 MG tablet Take 1 tablet by mouth 2 (two) times daily. (Patient not taking: Reported on 08/24/2018)  . [DISCONTINUED] losartan (COZAAR) 25 MG tablet Take 1 tablet (25 mg total) by mouth daily.  . [DISCONTINUED] nicotine (NICODERM CQ - DOSED IN MG/24 HOURS) 14 mg/24hr patch Place 1 patch (14 mg total) onto the skin daily. (Patient not taking: Reported on 07/12/2018)  . [DISCONTINUED] pregabalin (LYRICA) 75 MG capsule Take 1 capsule (75 mg total) by mouth 3 (three) times daily. For PASS   No facility-administered encounter medications on file as of 08/24/2018.     Observations/Objective: Results for orders placed or performed in visit on 04/25/18  POCT glucose (manual entry)  Result Value Ref Range   POC Glucose 132 (A) 70 - 99 mg/dl  POCT glycosylated hemoglobin (Hb A1C)  Result Value Ref Range   Hemoglobin A1C     HbA1c POC (<> result, manual entry) 5.6 4.0 - 5.6 %   HbA1c, POC (prediabetic range)     HbA1c, POC (controlled diabetic range)       Assessment and Plan: 1. Essential hypertension Reported blood pressure readings are not at goal.  Continue HCTZ, amlodipine and increase Cozaar to 50 mg daily.  Continue low-salt diet. - losartan (COZAAR) 50 MG tablet; Take 1 tablet (50 mg total) by mouth daily.  Dispense: 90  tablet; Refill: 1  2. Tobacco dependence Commended her on cutting back.  Refills given on nicotine patches.  Encouraged her to set a quit date.  Less than 5 minutes spent on counseling. - nicotine (NICODERM CQ - DOSED IN MG/24 HOURS) 14 mg/24hr patch; Place 1 patch (14 mg total) onto the skin daily.  Dispense: 28 patch; Refill: 1  3. COPD mixed type [facility]) Discussed the importance of smoking cessation.  Advised that the Symbicort is meant for daily use.  4. Lumbar radiculopathy, chronic We will have them schedule a follow-up appointment for her to see Dr. [name] to discuss her lower back and the MRI results At this time she appears to be deriving some benefit from Lyrica and Tyl #4 so we will plan to continue those for now. Discussed the importance of trying to stay active and functional as much as her back would allow. Continue use of heating pad as needed - Ambulatory referral to Orthopedic Surgery  5. Cervical radiculopathy Followed by orthopedics Dr. [name] - pregabalin (LYRICA) 75 MG capsule; Take 1 capsule (75 mg total) by mouth 3 (three) times daily.  Dispense: 90 capsule; Refill: 1  6. Enlarged uterus I will have my CMA reschedule the pelvic ultrasound for the end of next month.  She will follow-up with me in 2 months for her Pap smear.   Follow Up Instructions: F/u in 2 mths for PAP   I discussed the assessment and treatment plan with the patient. The patient was provided an opportunity to ask questions and all were answered. The patient agreed with the plan and demonstrated an understanding of the instructions.   The patient was advised to call back or seek an in-person evaluation if the symptoms worsen or if the condition fails to improve as anticipated.  I provided  18 minutes of non-face-to-face time during this encounter.   Karle Plumber, MD

## 2018-08-25 ENCOUNTER — Telehealth: Payer: Self-pay

## 2018-08-25 NOTE — Telephone Encounter (Signed)
[place] to call in Lyrica 75MG  Rx. Spoke to [place]  Name: Lyrica  Strength: 75Mg  Sig: Take 1 tablet capsule by mouth 3 times daily Quantity: 90 Refills: 1   Went on the [employer] to do prior auth for [place]  CPT- [phone] Ultrasound, pelvic (nonobstetric), B-scan and/or real time with image documentation, complete   76830 Ultrasound, transvaginal  ICD- N85.2 Hypertrophy of uterus  Authorization # [id number]   Effective- 08/25/18 Expires- 02/21/19   Contacted the scheduleing department and rescheduled [place]. [place] is scheduled for August 30, 2018 at 1030am at [facility]. Pt is to arrive at 1015am with full bladder  Contacted pt and provided new appointment for [place]. Pt doesn't have any questions or concerns

## 2018-08-28 ENCOUNTER — Other Ambulatory Visit: Payer: Self-pay | Admitting: Physician Assistant

## 2018-08-30 ENCOUNTER — Other Ambulatory Visit: Payer: Self-pay

## 2018-08-30 ENCOUNTER — Ambulatory Visit (HOSPITAL_COMMUNITY)
Admission: RE | Admit: 2018-08-30 | Discharge: 2018-08-30 | Disposition: A | Discharge status: Home / Self Care | Payer: Medicaid Other | Source: Ambulatory Visit | Attending: Internal Medicine | Admitting: Internal Medicine

## 2018-08-30 DIAGNOSIS — N852 Hypertrophy of uterus: Secondary | ICD-10-CM | POA: Diagnosis not present

## 2018-09-01 ENCOUNTER — Telehealth: Payer: Self-pay | Admitting: Internal Medicine

## 2018-09-01 DIAGNOSIS — R9389 Abnormal findings on diagnostic imaging of other specified body structures: Secondary | ICD-10-CM

## 2018-09-01 NOTE — Telephone Encounter (Signed)
Phone call placed to patient this a.m.  Patient informed that the pelvic ultrasound showed that the lining of her uterus is much thicker than it should be for someone who is postmenopausal.  The radiologist recommends referral to gynecology for further evaluation.  Patient reports that she had an endometrial biopsy back in 2015 when she had postmenopausal bleeding.  This was negative for cancer.  She is agreeable nonetheless to seeing the gynecologist again.  Referral submitted.

## 2018-09-13 ENCOUNTER — Other Ambulatory Visit: Payer: Self-pay | Admitting: Pharmacist

## 2018-09-13 DIAGNOSIS — F17200 Nicotine dependence, unspecified, uncomplicated: Secondary | ICD-10-CM

## 2018-09-13 MED ORDER — NICOTINE 14 MG/24HR TD PT24: 14.0000 mg | MEDICATED_PATCH | Freq: Every day | TRANSDERMAL | 1 refills | Status: DC

## 2018-09-20 ENCOUNTER — Other Ambulatory Visit: Payer: Self-pay | Admitting: Internal Medicine

## 2018-09-20 DIAGNOSIS — M5416 Radiculopathy, lumbar region: Secondary | ICD-10-CM

## 2018-09-20 NOTE — Telephone Encounter (Signed)
New Message 1) Medication(s) Requested (by name): Tylenol 4  2) Pharmacy of Choice: [facility]   3) Special Requests:   Approved medications will be sent to the pharmacy, we will reach out if there is an issue.  Requests made after 3pm may not be addressed until the following business day!  If a patient is unsure of the name of the medication(s) please note and ask patient to call back when they are able to provide all info, do not send to responsible party until all information is available!

## 2018-09-21 ENCOUNTER — Telehealth: Payer: Self-pay

## 2018-09-21 MED ORDER — ACETAMINOPHEN-CODEINE #4 300-60 MG PO TABS: 1.0000 | ORAL_TABLET | Freq: Four times a day (QID) | ORAL | 1 refills | Status: DC | PRN

## 2018-09-21 NOTE — Telephone Encounter (Signed)
Verbal order for tylenol #4 given to the pharmacist at [facility].

## 2018-09-22 NOTE — Telephone Encounter (Signed)
Verbal order given yesterday 09/21/18

## 2018-09-22 NOTE — Telephone Encounter (Signed)
Please call in if you havent already

## 2018-09-28 ENCOUNTER — Ambulatory Visit: Payer: Medicaid Other | Attending: Internal Medicine | Admitting: Internal Medicine

## 2018-09-28 ENCOUNTER — Other Ambulatory Visit: Payer: Self-pay

## 2018-09-28 ENCOUNTER — Encounter: Payer: Self-pay | Admitting: Internal Medicine

## 2018-09-28 VITALS — BP 135/75 | HR 68 | Temp 98.4°F | Resp 18 | Ht 63.0 in | Wt 164.0 lb

## 2018-09-28 DIAGNOSIS — F17200 Nicotine dependence, unspecified, uncomplicated: Secondary | ICD-10-CM | POA: Diagnosis not present

## 2018-09-28 DIAGNOSIS — M4322 Fusion of spine, cervical region: Secondary | ICD-10-CM | POA: Diagnosis not present

## 2018-09-28 DIAGNOSIS — Z836 Family history of other diseases of the respiratory system: Secondary | ICD-10-CM | POA: Insufficient documentation

## 2018-09-28 DIAGNOSIS — Z7984 Long term (current) use of oral hypoglycemic drugs: Secondary | ICD-10-CM | POA: Insufficient documentation

## 2018-09-28 DIAGNOSIS — Z8249 Family history of ischemic heart disease and other diseases of the circulatory system: Secondary | ICD-10-CM | POA: Diagnosis not present

## 2018-09-28 DIAGNOSIS — Z8059 Family history of malignant neoplasm of other urinary tract organ: Secondary | ICD-10-CM | POA: Insufficient documentation

## 2018-09-28 DIAGNOSIS — Z981 Arthrodesis status: Secondary | ICD-10-CM | POA: Insufficient documentation

## 2018-09-28 DIAGNOSIS — E669 Obesity, unspecified: Secondary | ICD-10-CM | POA: Diagnosis not present

## 2018-09-28 DIAGNOSIS — Z79899 Other long term (current) drug therapy: Secondary | ICD-10-CM | POA: Diagnosis not present

## 2018-09-28 DIAGNOSIS — Z1211 Encounter for screening for malignant neoplasm of colon: Secondary | ICD-10-CM | POA: Insufficient documentation

## 2018-09-28 DIAGNOSIS — Z1239 Encounter for other screening for malignant neoplasm of breast: Secondary | ICD-10-CM | POA: Diagnosis not present

## 2018-09-28 DIAGNOSIS — J449 Chronic obstructive pulmonary disease, unspecified: Secondary | ICD-10-CM | POA: Insufficient documentation

## 2018-09-28 DIAGNOSIS — G8929 Other chronic pain: Secondary | ICD-10-CM | POA: Insufficient documentation

## 2018-09-28 DIAGNOSIS — F17210 Nicotine dependence, cigarettes, uncomplicated: Secondary | ICD-10-CM | POA: Diagnosis not present

## 2018-09-28 DIAGNOSIS — E119 Type 2 diabetes mellitus without complications: Secondary | ICD-10-CM | POA: Diagnosis not present

## 2018-09-28 DIAGNOSIS — I10 Essential (primary) hypertension: Secondary | ICD-10-CM | POA: Diagnosis not present

## 2018-09-28 DIAGNOSIS — R110 Nausea: Secondary | ICD-10-CM | POA: Insufficient documentation

## 2018-09-28 DIAGNOSIS — Z6829 Body mass index (BMI) 29.0-29.9, adult: Secondary | ICD-10-CM | POA: Insufficient documentation

## 2018-09-28 DIAGNOSIS — R195 Other fecal abnormalities: Secondary | ICD-10-CM | POA: Diagnosis not present

## 2018-09-28 DIAGNOSIS — Z888 Allergy status to other drugs, medicaments and biological substances status: Secondary | ICD-10-CM | POA: Diagnosis not present

## 2018-09-28 DIAGNOSIS — Z7951 Long term (current) use of inhaled steroids: Secondary | ICD-10-CM | POA: Insufficient documentation

## 2018-09-28 NOTE — Patient Instructions (Signed)
Please set a quit date for smoking.  Try eating a light breakfast or drinking a breakfast shake like Glucerna in the mornings.

## 2018-09-28 NOTE — Progress Notes (Signed)
Patient ID: Charlene Allen, female    DOB: [birth year]  MRN: [id number]  CC: Gynecologic Exam   Subjective: Charlene Allen is a 63 y.o. female who presents for PAP/breast exam Her concerns today include:  Hx of chronic neck/lower back pain on Tylenol #4, Lyrica), HTN,DM, HL,tob, obesity, COPD, depression, S/p c-spine fusion 2018.  Last pap was 07/2016.  It was normal.  HPV was negative.  Plan was for repeat Pap in 3 years which will put her at 2021.  Patient is postmenopausal.  She has not had any postmenopausal bleeding.  I referred her to gynecology for evaluation of thickened uterine endometrium on ultrasound.  That appointment is pending. Never called for MMG that was ordered earlier this year No fhx of breast or uterine cancer Brother with prostae CA.    Given fit test in January of this year.  Patient states that she mailed it off however we never received the results.  She is agreeable to doing it again.  She is not interested in doing colonoscopy.  Moving bowels normally.  No blood in stools.   C/o nausea x 2 wks.   Worse in mornings.  She does not eat breakfast.  She smokes a cigarette first thing in the morning then puts a nicotine patch on Nausea is better and resolves after she eats.  However her first meal of the day is lunch.    Patient reports that she is down to 1 cigarette a day. Patient states that she has to have 1 cigarette in the morning and then she puts a patch on.  Patient Active Problem List   Diagnosis Date Noted  . Controlled type 2 diabetes mellitus without complication, without long-term current use of insulin ([place]) 11/15/2017  . Lumbar radiculopathy, chronic 11/15/2017  . Increased endometrial stripe thickness 09/08/2016  . Chronic right shoulder pain 07/22/2016  . COPD GOLD 0 still smoking  06/18/2016  . HPV test positive 10/13/2015  . Mild diastolic dysfunction 40/76/8088  . Chronic diarrhea 08/13/2015  . Cigarette smoker 08/13/2015  . Chronic  back pain 03/15/2013  . Essential hypertension, benign 03/15/2013  . History of substance abuse ([place]) 03/15/2013     Current Outpatient Medications on File Prior to Visit  Medication Sig Dispense Refill  . acetaminophen-codeine (TYLENOL #4) 300-60 MG tablet Take 1 tablet by mouth every 6 (six) hours as needed for moderate pain (each prescription to last 1 mth). 120 tablet 1  . albuterol (PROVENTIL HFA;VENTOLIN HFA) 108 (90 Base) MCG/ACT inhaler Inhale 2 puffs into the lungs every 6 (six) hours as needed for wheezing or shortness of breath. 1 Inhaler 3  . amLODipine (NORVASC) 10 MG tablet Take 1 tablet (10 mg total) by mouth daily. 30 tablet 3  . atorvastatin (LIPITOR) 20 MG tablet Take 1 tablet (20 mg total) by mouth daily. 90 tablet 1  . Blood Glucose Monitoring Suppl (TRUE METRIX METER) w/Device KIT Use as directed 1 kit 0  . budesonide-formoterol (SYMBICORT) 80-4.5 MCG/ACT inhaler Take 2 puffs first thing in am and then another 2 puffs about 12 hours later. 1 Inhaler 11  . chlorhexidine (PERIDEX) 0.12 % solution Use as directed 15 mLs in the mouth or throat 2 (two) times daily. 120 mL 0  . fluticasone (FLONASE) 50 MCG/ACT nasal spray Place 1 spray into both nostrils daily as needed for allergies or rhinitis.    Marland Kitchen glucose blood (TRUE METRIX BLOOD GLUCOSE TEST) test strip Use as instructed 100 each 12  . hydrochlorothiazide (  HYDRODIURIL) 25 MG tablet Take 1 tablet (25 mg total) by mouth daily. 30 tablet 6  . losartan (COZAAR) 50 MG tablet Take 1 tablet (50 mg total) by mouth daily. 90 tablet 1  . metFORMIN (GLUCOPHAGE) 500 MG tablet Take 1 tablet (500 mg total) by mouth 2 (two) times daily with a meal. 60 tablet 2  . methocarbamol (ROBAXIN) 500 MG tablet Take 1 tablet (500 mg total) by mouth every 8 (eight) hours as needed for muscle spasms. 40 tablet 0  . nicotine (NICODERM CQ - DOSED IN MG/24 HOURS) 14 mg/24hr patch Place 1 patch (14 mg total) onto the skin daily. 28 patch 1  . nitroGLYCERIN  (NITROSTAT) 0.4 MG SL tablet Place 1 tablet (0.4 mg total) under the tongue every 5 (five) minutes as needed for chest pain.  12  . potassium chloride SA (K-DUR,KLOR-CON) 20 MEQ tablet TAKE ONE TABLET BY MOUTH EVERY DAY 30 tablet 6  . pregabalin (LYRICA) 75 MG capsule Take 1 capsule (75 mg total) by mouth 3 (three) times daily. 90 capsule 1  . TRUEPLUS LANCETS 28G MISC Use as directed 100 each 12   No current facility-administered medications on file prior to visit.     Allergies  Allergen Reactions  . Lisinopril Nausea Only    Social History   Socioeconomic History  . Marital status: Married    Spouse name: Not on file  . Number of children: Not on file  . Years of education: Not on file  . Highest education level: Not on file  Occupational History  . Not on file  Social Needs  . Financial resource strain: Not on file  . Food insecurity    Worry: Not on file    Inability: Not on file  . Transportation needs    Medical: Not on file    Non-medical: Not on file  Tobacco Use  . Smoking status: Current Every Day Smoker    Packs/day: 0.50    Years: 40.00    Pack years: 20.00    Types: Cigarettes  . Smokeless tobacco: Never Used  Substance and Sexual Activity  . Alcohol use: No    Alcohol/week: 0.0 standard drinks  . Drug use: No  . Sexual activity: Not Currently    Birth control/protection: None  Lifestyle  . Physical activity    Days per week: Not on file    Minutes per session: Not on file  . Stress: Not on file  Relationships  . Social Herbalist on phone: Not on file    Gets together: Not on file    Attends religious service: Not on file    Active member of club or organization: Not on file    Attends meetings of clubs or organizations: Not on file    Relationship status: Not on file  . Intimate partner violence    Fear of current or ex partner: Not on file    Emotionally abused: Not on file    Physically abused: Not on file    Forced sexual  activity: Not on file  Other Topics Concern  . Not on file  Social History Narrative  . Not on file    Family History  Problem Relation Age of Onset  . COPD Mother   . Stroke Mother   . Heart disease Mother   . Hypertension Mother   . Cancer Sister 25       cervical cancer   . Stroke Maternal Grandmother  Past Surgical History:  Procedure Laterality Date  . ABDOMINAL AORTOGRAM N/A 07/12/2016   Procedure: Abdominal Aortogram;  Surgeon: Jettie Booze, MD;  Location: [facility];  Service: Cardiovascular;  Laterality: N/A;  . ANTERIOR CERVICAL DECOMP/DISCECTOMY FUSION N/A 09/05/2017   Procedure: C4-5, C5-6 ANTERIOR CERVICAL DECOMPRESSION/DISCECTOMY FUSION, ALLOGRAFT, PLATE;  Surgeon: Marybelle Killings, MD;  Location: [facility];  Service: Orthopedics;  Laterality: N/A;  . HAND SURGERY    . LEFT HEART CATH AND CORONARY ANGIOGRAPHY N/A 07/12/2016   Procedure: Left Heart Cath and Coronary Angiography;  Surgeon: Jettie Booze, MD;  Location: [facility];  Service: Cardiovascular;  Laterality: N/A;  . TUBAL LIGATION      ROS: Review of Systems Negative except as stated above  PHYSICAL EXAM: BP 135/75 (BP Location: Left Arm, Patient Position: Sitting, Cuff Size: Normal)   Pulse 68   Temp 98.4 F (36.9 C) (Oral)   Resp 18   Ht '5\' 3"'  (1.6 m)   Wt 164 lb (74.4 kg)   SpO2 99%   BMI 29.05 kg/m   Physical Exam  General appearance - alert, well appearing, and in no distress Mental status - normal mood, behavior, speech, dress, motor activity, and thought processes Neck - supple, no significant adenopathy Chest - clear to auscultation, no wheezes, rales or rhonchi, symmetric air entry Heart - normal rate, regular rhythm, normal S1, S2, no murmurs, rubs, clicks or gallops Breasts - breasts appear normal, no suspicious masses, no skin or nipple changes or axillary nodes Abdomen: Normal bowel sounds soft and nontender Extremities - peripheral pulses normal, no pedal  edema, no clubbing or cyanosis Pelvic exam: Not done as patient was not due for Pap CMP Latest Ref Rng & Units 09/05/2017 09/01/2017 08/29/2017  Glucose 65 - 99 mg/dL 120(H) - 162(H)  BUN 6 - 20 mg/dL - - 9  Creatinine 0.44 - 1.00 mg/dL - - 0.75  Sodium 135 - 145 mmol/L 141 - 139  Potassium 3.5 - 5.1 mmol/L 3.1(L) 3.6 2.8(L)  Chloride 101 - 111 mmol/L - - 98(L)  CO2 22 - 32 mmol/L - - 27  Calcium 8.9 - 10.3 mg/dL - - 9.6  Total Protein 6.5 - 8.1 g/dL - - 7.6  Total Bilirubin 0.3 - 1.2 mg/dL - - 0.5  Alkaline Phos 38 - 126 U/L - - 94  AST 15 - 41 U/L - - 22  ALT 14 - 54 U/L - - 22   Lipid Panel     Component Value Date/Time   CHOL 197 08/12/2015 1145   TRIG 218 (H) 08/12/2015 1145   HDL 42 (L) 08/12/2015 1145   CHOLHDL 4.7 08/12/2015 1145   VLDL 44 (H) 08/12/2015 1145   LDLCALC 111 08/12/2015 1145    CBC    Component Value Date/Time   WBC 10.1 08/29/2017 0914   RBC 5.17 (H) 08/29/2017 0914   HGB 13.3 09/05/2017 1119   HGB 13.8 07/05/2016 1116   HCT 39.0 09/05/2017 1119   HCT 40.4 07/05/2016 1116   PLT 407 (H) 08/29/2017 0914   PLT 370 07/05/2016 1116   MCV 84.1 08/29/2017 0914   MCV 83 07/05/2016 1116   MCH 28.0 08/29/2017 0914   MCHC 33.3 08/29/2017 0914   RDW 12.7 08/29/2017 0914   RDW 13.6 07/05/2016 1116   LYMPHSABS 3.2 (H) 07/05/2016 1116   MONOABS 0.6 03/15/2013 1003   EOSABS 0.3 07/05/2016 1116   BASOSABS 0.0 07/05/2016 1116    ASSESSMENT AND  PLAN: 1. Breast cancer screening - MM Digital Screening; Future  2. Screening for colon cancer - Fecal occult blood, imunochemical([facility])  3. Tobacco dependence Advised to quit.  Patient states she is working on it.  I have encouraged her to set a quit date beyond which she should plan not to smoke any cigarettes and continue using the nicotine patches as long as she needs to to decrease cravings  4. Nausea Advised patient to try eat something in the mornings before she puts the nicotine patch on.  If she  does not feel she wants to eat a full breakfast she can do a breakfast shake like Glucerna shake or eat some fruits.  She will give this a try.  If the nausea persists she will follow-up for further evaluation  Patient was given the opportunity to ask questions.  Patient verbalized understanding of the plan and was able to repeat key elements of the plan.   No orders of the defined types were placed in this encounter.    Requested Prescriptions    No prescriptions requested or ordered in this encounter    No follow-ups on file.  Karle Plumber, MD, FACP

## 2018-10-07 ENCOUNTER — Other Ambulatory Visit: Payer: Self-pay | Admitting: Internal Medicine

## 2018-10-07 DIAGNOSIS — I10 Essential (primary) hypertension: Secondary | ICD-10-CM

## 2018-10-16 ENCOUNTER — Telehealth: Payer: Self-pay | Admitting: Internal Medicine

## 2018-10-16 NOTE — Telephone Encounter (Signed)
Patient would like to be prescribed muscle relaxer. Patient states she pulled a muscle and is not sure how she did it. Please follow up.

## 2018-10-16 NOTE — Telephone Encounter (Signed)
Will forward to [facility]

## 2018-10-18 NOTE — Telephone Encounter (Signed)
Pt states Tyelon #4 has not been called in. Pt states [facility] hasnt not received it.  Pt said please make sure it is covered by Medicaid.

## 2018-10-19 ENCOUNTER — Telehealth: Payer: Self-pay | Admitting: Internal Medicine

## 2018-10-19 DIAGNOSIS — M5416 Radiculopathy, lumbar region: Secondary | ICD-10-CM

## 2018-10-19 MED ORDER — METHOCARBAMOL 500 MG PO TABS: 500.0000 mg | ORAL_TABLET | Freq: Three times a day (TID) | ORAL | 0 refills | Status: DC | PRN

## 2018-10-19 MED ORDER — ACETAMINOPHEN-CODEINE #4 300-60 MG PO TABS: 1.0000 | ORAL_TABLET | Freq: Four times a day (QID) | ORAL | 1 refills | Status: DC | PRN

## 2018-10-19 NOTE — Telephone Encounter (Signed)
Pt called for refill on -acetaminophen-codeine (TYLENOL #4) 300-60 MG tablet  -methocarbamol (ROBAXIN) 500 MG tablet To -[facility] [place]

## 2018-10-19 NOTE — Telephone Encounter (Signed)
Refilled

## 2018-10-23 ENCOUNTER — Other Ambulatory Visit: Payer: Self-pay

## 2018-10-23 ENCOUNTER — Ambulatory Visit: Payer: Medicaid Other | Attending: Internal Medicine | Admitting: Internal Medicine

## 2018-10-23 ENCOUNTER — Encounter: Payer: Self-pay | Admitting: Internal Medicine

## 2018-10-23 DIAGNOSIS — M5416 Radiculopathy, lumbar region: Secondary | ICD-10-CM | POA: Diagnosis not present

## 2018-10-23 DIAGNOSIS — M62830 Muscle spasm of back: Secondary | ICD-10-CM

## 2018-10-23 MED ORDER — METHOCARBAMOL 500 MG PO TABS: 500.0000 mg | ORAL_TABLET | Freq: Two times a day (BID) | ORAL | 0 refills | Status: DC | PRN

## 2018-10-23 NOTE — Progress Notes (Signed)
Virtual Visit via Telephone Note Due to current restrictions/limitations of in-office visits due to the COVID-19 pandemic, this scheduled clinical appointment was converted to a telehealth visit  I connected with Charlene Allen on 10/23/18 at 2:07 p.m by telephone and verified that I am speaking with the correct person using two identifiers. I am in my office.  The patient is at home.  Only the patient and myself participated in this encounter.  I discussed the limitations, risks, security and privacy concerns of performing an evaluation and management service by telephone and the availability of in person appointments. I also discussed with the patient that there may be a patient responsible charge related to this service. The patient expressed understanding and agreed to proceed.   History of Present Illness: Hx of chronic neck/lower back pain on Tylenol #4, Lyrica), HTN,DM,HL,tob, obesity, COPD, depression,S/p c-spine fusion 2018.  "I pulled a muscle in my bad about a wk a go I guess." -got progressively worse for 4 days but starting to get a little better.  Requesting RF  on Robaxin.  Rxn sent to [facility] on 10/19/2018 along with refill on Tylenol #4 by Dr. Margarita Rana in my absence.  Patient states that the pharmacy did get the Tylenol with Codeine but did not receive the Robaxin prescription.  -She is also requesting prior approval for the Tylenol with Codeine through Medicaid as she is having to pay out-of-pocket. 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  Referred to Dr. [name] back in May.  They called her in June.  Appointment is scheduled for September. -Reports good relief with Ty#4 and Lyrica.  Without the medication she rates pain as 8/10.  With the medication pain level drops to 4/10.  She is able to function but avoids strenuous activities.  She denies any significant side effects or constipation or  drowsiness. - HM:  She did receive call for [facility] but could not go because her daughter is no longer able to drive due to recent seizure.  Reports that she mailed in her fit test about 1 week ago.   Outpatient Encounter Medications as of 10/23/2018  Medication Sig  . acetaminophen-codeine (TYLENOL #4) 300-60 MG tablet Take 1 tablet by mouth every 6 (six) hours as needed for moderate pain (each prescription to last 1 mth).  Marland Kitchen albuterol (PROVENTIL HFA;VENTOLIN HFA) 108 (90 Base) MCG/ACT inhaler Inhale 2 puffs into the lungs every 6 (six) hours as needed for wheezing or shortness of breath.  Marland Kitchen amLODipine (NORVASC) 10 MG tablet Take 1 tablet (10 mg total) by mouth daily.  Marland Kitchen atorvastatin (LIPITOR) 20 MG tablet Take 1 tablet (20 mg total) by mouth daily.  . Blood Glucose Monitoring Suppl (TRUE METRIX METER) w/Device KIT Use as directed  . budesonide-formoterol (SYMBICORT) 80-4.5 MCG/ACT inhaler Take 2 puffs first thing in am and then another 2 puffs about 12 hours later.  . chlorhexidine (PERIDEX) 0.12 % solution Use as directed 15 mLs in the mouth or throat 2 (two) times daily.  Marland Kitchen glucose blood (TRUE METRIX BLOOD GLUCOSE TEST) test strip Use as instructed  . hydrochlorothiazide (HYDRODIURIL) 25 MG tablet Take 1 tablet (25 mg total) by mouth daily.  Marland Kitchen losartan (COZAAR) 50 MG tablet Take 1 tablet (50 mg total) by mouth daily.  . metFORMIN (GLUCOPHAGE) 500 MG tablet Take 1 tablet (500 mg total) by mouth 2 (two) times daily with a meal.  . methocarbamol (ROBAXIN) 500 MG  tablet Take 1 tablet (500 mg total) by mouth every 8 (eight) hours as needed for muscle spasms.  . nicotine (NICODERM CQ - DOSED IN MG/24 HOURS) 14 mg/24hr patch Place 1 patch (14 mg total) onto the skin daily.  . nitroGLYCERIN (NITROSTAT) 0.4 MG SL tablet Place 1 tablet (0.4 mg total) under the tongue every 5 (five) minutes as needed for chest pain.  . pregabalin (LYRICA) 75 MG capsule Take 1 capsule (75 mg total) by mouth 3 (three) times  daily. (Patient taking differently: Take 75 mg by mouth daily. )  . TRUEPLUS LANCETS 28G MISC Use as directed  . fluticasone (FLONASE) 50 MCG/ACT nasal spray Place 1 spray into both nostrils daily as needed for allergies or rhinitis.  . potassium chloride SA (K-DUR,KLOR-CON) 20 MEQ tablet TAKE ONE TABLET BY MOUTH EVERY DAY (Patient not taking: Reported on 10/23/2018)   No facility-administered encounter medications on file as of 10/23/2018.     Observations/Objective: No direct observation done as this was a telephone encounter.  Assessment and Plan: 1. Muscle spasm of back - methocarbamol (ROBAXIN) 500 MG tablet; Take 1 tablet (500 mg total) by mouth 2 (two) times daily as needed for muscle spasms.  Dispense: 40 tablet; Refill: 0  2. Lumbar radiculopathy, chronic Patient on Tyl#4 She reports improvement in pain level when she takes the medication to allow her to function during the day. Denies any significant side effects. She will keep her appointment with spine specialist Dr. [name] in September. Needs prior approval for Medicaid to pay for  Ty#4. [place] reviewed. We will need UDS and updated controlled substance prescribing agreement on her next visit in September.  Last UDS was 10/2017  3.  Breast cancer screening -Advised patient to apply for [employer] transportation as a means of getting to and from doctor's appointments since her daughter had a recent seizure and is no longer able to drive for at least 6 to 12 months.  Follow Up Instructions: F/u in 2 mths   I discussed the assessment and treatment plan with the patient. The patient was provided an opportunity to ask questions and all were answered. The patient agreed with the plan and demonstrated an understanding of the instructions.   The patient was advised to call back or seek an in-person evaluation if the symptoms worsen or if the condition fails to improve as anticipated.  I provided 10 minutes of non-face-to-face time during  this encounter.   Karle Plumber, MD

## 2018-10-23 NOTE — Progress Notes (Signed)
Left Lower back pain for over 1 wk now pain at a 7   Med refill for Robaxin

## 2018-10-25 NOTE — Telephone Encounter (Signed)
Just FYI I do not cover for Dr. Wynetta Emery. That would be Dr. Margarita Rana in her absence. Thanks

## 2018-11-15 ENCOUNTER — Telehealth: Payer: Self-pay

## 2018-11-15 LAB — FECAL OCCULT BLOOD, IMMUNOCHEMICAL

## 2018-11-15 NOTE — Telephone Encounter (Signed)
Contacted pt to go over fit test results pt didn't answer lvm asking pt to give me a call at her earliest convenience

## 2018-11-20 ENCOUNTER — Other Ambulatory Visit: Payer: Self-pay | Admitting: Family Medicine

## 2018-11-20 DIAGNOSIS — M5416 Radiculopathy, lumbar region: Secondary | ICD-10-CM

## 2018-11-29 ENCOUNTER — Telehealth: Payer: Self-pay | Admitting: Obstetrics and Gynecology

## 2018-11-29 NOTE — Telephone Encounter (Signed)
Attempted to call patient about her appointment on 9/3 @ 10:35. No answer left voicemail instructing patient to wear a face mask for the entire appointment and no visitors are allowed during the visit. Patient instructed not to attend the appointment if she was any symptoms. Symptom list and office number left.

## 2018-11-30 ENCOUNTER — Encounter: Payer: Medicaid Other | Admitting: Obstetrics and Gynecology

## 2018-12-07 ENCOUNTER — Other Ambulatory Visit: Payer: Self-pay | Admitting: Internal Medicine

## 2018-12-07 DIAGNOSIS — I10 Essential (primary) hypertension: Secondary | ICD-10-CM

## 2018-12-08 ENCOUNTER — Ambulatory Visit: Payer: Medicaid Other | Admitting: Internal Medicine

## 2018-12-12 ENCOUNTER — Ambulatory Visit (INDEPENDENT_AMBULATORY_CARE_PROVIDER_SITE_OTHER): Payer: Medicaid Other | Admitting: Orthopaedic Surgery

## 2018-12-12 ENCOUNTER — Encounter: Payer: Self-pay | Admitting: Orthopaedic Surgery

## 2018-12-12 VITALS — BP 151/87 | HR 61 | Ht 63.0 in | Wt 163.0 lb

## 2018-12-12 DIAGNOSIS — Z981 Arthrodesis status: Secondary | ICD-10-CM | POA: Diagnosis not present

## 2018-12-12 DIAGNOSIS — G8929 Other chronic pain: Secondary | ICD-10-CM

## 2018-12-12 DIAGNOSIS — M25511 Pain in right shoulder: Secondary | ICD-10-CM | POA: Diagnosis not present

## 2018-12-12 NOTE — Progress Notes (Signed)
Office Visit Note   Patient: Charlene Allen           Date of Birth: [birth year]           MRN: [id number] Visit Date: 12/12/2018              Requested by: Ladell Pier, MD [address] [place],  [place] [address] PCP: Ladell Pier, MD   Assessment & Plan: Visit Diagnoses:  1. Chronic right shoulder pain   2. Hx of fusion of cervical spine           With elbow pain  Plan: Patient will call her PCP about restarting some depression medicine.  She is got multiple family issues.  She is waking up for alarm goes off is concerned about not being able to find a job.  We reviewed previous x-rays which show solid fusion.  She has a nonfocal exam and I think treatment with some depression medicine is most likely to improve the pain that she is having.  Follow-up will be on a as needed basis.  Follow-Up Instructions: No follow-ups on file.   Orders:  No orders of the defined types were placed in this encounter.  No orders of the defined types were placed in this encounter.     Procedures: No procedures performed   Clinical Data: No additional findings.   Subjective: Chief Complaint  Patient presents with   Left Arm - Pain    09/05/2017 C4-5, C5-6 ACDF, Allograft, Plate    HPI 63 year old female returns post two-level cervical fusion C4-C6 on 09/05/2017.  Patient continues to have pain around her elbow some over the distal triceps.  She had electrical test which were negative other than some mild carpal tunnel slowing after previous carpal tunnel release years ago.  Patient states she wakes up at 4 in the morning has trouble getting back to sleep she has been trying to get a job where she can work at home so far unsuccessful.  States she has a niece that was hit by a car in [place] and is brain dead on the ventilator and she has to go to [place] this week.  She has multiple other family problems.  She has been treated for depression in the past years ago but currently  is not on anything.  Review of Systems 14 point view of systems updated unchanged from 06/06/2018.  Of note is history of type 2 diabetes cervical fusion history of substance abuse and history of depression.   Objective: Vital Signs: BP (!) 151/87    Pulse 61    Ht 5\' 3"  (1.6 m)    Wt 163 lb (73.9 kg)    BMI 28.87 kg/m   Physical Exam Constitutional:      Appearance: She is well-developed.  HENT:     Head: Normocephalic.     Right Ear: External ear normal.     Left Ear: External ear normal.  Eyes:     Pupils: Pupils are equal, round, and reactive to light.  Neck:     Thyroid: No thyromegaly.     Trachea: No tracheal deviation.  Cardiovascular:     Rate and Rhythm: Normal rate.  Pulmonary:     Effort: Pulmonary effort is normal.  Abdominal:     Palpations: Abdomen is soft.  Skin:    General: Skin is warm and dry.  Neurological:     Mental Status: She is alert and oriented to person, place, and time.  Psychiatric:        Behavior: Behavior normal.     Ortho Exam patient has intact reflexes upper extremity.  Some tenderness over the cubital tunnel some tenderness over the distal biceps some over the distal triceps good resistive strength testing.  Full supination pronation no elbow effusion no impingement with shoulder neck incisions well-healed no lymphadenopathy.  No interosseous weakness.  Specialty Comments:  No specialty comments available.  Imaging: No results found.   PMFS History: Patient Active Problem List   Diagnosis Date Noted   Hx of fusion of cervical spine 12/12/2018   Controlled type 2 diabetes mellitus without complication, without long-term current use of insulin ([place]) 11/15/2017   Lumbar radiculopathy, chronic 11/15/2017   Increased endometrial stripe thickness 09/08/2016   Chronic right shoulder pain 07/22/2016   COPD GOLD 0 still smoking  06/18/2016   HPV test positive [id number]   Mild diastolic dysfunction [id number]   Chronic  diarrhea 08/13/2015   Cigarette smoker 08/13/2015   Chronic back pain 03/15/2013   Essential hypertension, benign 03/15/2013   History of substance abuse ([place]) 03/15/2013   Past Medical History:  Diagnosis Date   Diabetes mellitus without complication ([place])    Hypertension Dx 2015   Obesity    PMB (postmenopausal bleeding)     Family History  Problem Relation Age of Onset   COPD Mother    Stroke Mother    Heart disease Mother    Hypertension Mother    Cancer Sister 34       cervical cancer    Stroke Maternal Grandmother     Past Surgical History:  Procedure Laterality Date   ABDOMINAL AORTOGRAM N/A 07/12/2016   Procedure: Abdominal Aortogram;  Surgeon: Jettie Booze, MD;  Location: [facility];  Service: Cardiovascular;  Laterality: N/A;   ANTERIOR CERVICAL DECOMP/DISCECTOMY FUSION N/A 09/05/2017   Procedure: C4-5, C5-6 ANTERIOR CERVICAL DECOMPRESSION/DISCECTOMY FUSION, ALLOGRAFT, PLATE;  Surgeon: Marybelle Killings, MD;  Location: [facility];  Service: Orthopedics;  Laterality: N/A;   HAND SURGERY     LEFT HEART CATH AND CORONARY ANGIOGRAPHY N/A 07/12/2016   Procedure: Left Heart Cath and Coronary Angiography;  Surgeon: Jettie Booze, MD;  Location: [facility];  Service: Cardiovascular;  Laterality: N/A;   TUBAL LIGATION     Social History   Occupational History   Not on file  Tobacco Use   Smoking status: Current Every Day Smoker    Packs/day: 0.50    Years: 40.00    Pack years: 20.00    Types: Cigarettes   Smokeless tobacco: Never Used  Substance and Sexual Activity   Alcohol use: No    Alcohol/week: 0.0 standard drinks   Drug use: No   Sexual activity: Not Currently    Birth control/protection: None

## 2018-12-14 ENCOUNTER — Other Ambulatory Visit: Payer: Self-pay | Admitting: Internal Medicine

## 2018-12-14 DIAGNOSIS — F17200 Nicotine dependence, unspecified, uncomplicated: Secondary | ICD-10-CM

## 2018-12-19 ENCOUNTER — Telehealth: Payer: Self-pay | Admitting: Internal Medicine

## 2018-12-19 ENCOUNTER — Telehealth: Payer: Self-pay | Admitting: Family Medicine

## 2018-12-19 ENCOUNTER — Telehealth: Payer: Self-pay | Admitting: Student

## 2018-12-19 DIAGNOSIS — M5416 Radiculopathy, lumbar region: Secondary | ICD-10-CM

## 2018-12-19 NOTE — Telephone Encounter (Signed)
Called the patient to inform of the cancellation of the appointment due to changes in the providers schedule. You will be contacted as early as tomorrow with an appointment update. If you have any question or concerns, please contact our office.

## 2018-12-19 NOTE — Telephone Encounter (Signed)
Attempted to call patient about her appointment on 9/23 @ 8:55. No answer left voicemail instructing patient to wear a face mask for the entire appointment and no visitors are allowed during the visit. Patient instructed not to attend the appointment if she was any symptoms. Symptom list and office number left.

## 2018-12-19 NOTE — Telephone Encounter (Signed)
Pt went to orthopedic surgery specialty clinic and would like to inform her nurse their plan of care prior to her follow up on 01/11/2019, please follow up when available

## 2018-12-19 NOTE — Telephone Encounter (Signed)
1) Medication(s) Requested (by name): -acetaminophen-codeine (TYLENOL #4) 300-60 MG tablet   2) Pharmacy of Choice: -[facility] [place]

## 2018-12-20 ENCOUNTER — Encounter: Payer: Medicaid Other | Admitting: Family Medicine

## 2018-12-20 ENCOUNTER — Telehealth: Payer: Self-pay | Admitting: Internal Medicine

## 2018-12-20 MED ORDER — ACETAMINOPHEN-CODEINE #4 300-60 MG PO TABS: 1.0000 | ORAL_TABLET | Freq: Four times a day (QID) | ORAL | 0 refills | Status: DC | PRN

## 2018-12-20 NOTE — Telephone Encounter (Signed)
Patient requesting MRI for left arm.. place order if appropriate request

## 2018-12-20 NOTE — Telephone Encounter (Signed)
Patients visit is noted in the Epic system and PCP will review for patients visit

## 2018-12-20 NOTE — Telephone Encounter (Signed)
RF on Tyl#4 sent to [facility].

## 2018-12-20 NOTE — Telephone Encounter (Signed)
Patient called wanting to know if she could get a mri done for her left Arm states she is in a lot of pain. Please follow up.

## 2019-01-08 ENCOUNTER — Telehealth: Payer: Self-pay | Admitting: Internal Medicine

## 2019-01-08 NOTE — Telephone Encounter (Signed)
Pt is covered in poison oak and needs medication for it. Nothing over the counter is working

## 2019-01-09 ENCOUNTER — Ambulatory Visit (INDEPENDENT_AMBULATORY_CARE_PROVIDER_SITE_OTHER): Payer: Medicaid Other | Admitting: Internal Medicine

## 2019-01-09 DIAGNOSIS — L237 Allergic contact dermatitis due to plants, except food: Secondary | ICD-10-CM

## 2019-01-09 MED ORDER — PREDNISONE 20 MG PO TABS: 20.0000 mg | ORAL_TABLET | Freq: Every day | ORAL | 0 refills | Status: DC

## 2019-01-09 MED ORDER — CLOBETASOL PROPIONATE 0.05 % EX CREA: 1.0000 "application " | TOPICAL_CREAM | Freq: Two times a day (BID) | CUTANEOUS | 0 refills | Status: DC

## 2019-01-09 NOTE — Progress Notes (Signed)
Virtual Visit via Telephone Note Due to current restrictions/limitations of in-office visits due to the COVID-19 pandemic, this scheduled clinical appointment was converted to a telehealth visit  I connected with Charlene Allen on 01/09/19 at 11:23 a.m by telephone and verified that I am speaking with the correct person using two identifiers. I am in my office.  The patient is at home.  Only the patient and myself participated in this encounter.  I discussed the limitations, risks, security and privacy concerns of performing an evaluation and management service by telephone and the availability of in person appointments. I also discussed with the patient that there may be a patient responsible charge related to this service. The patient expressed understanding and agreed to proceed.   History of Present Illness: Hx of chronic neck/lower back pain on Tylenol #4, Lyrica), HTN,DM,HL,tob, obesity, COPD, depression,S/p c-spine fusion 2018.  Patient states that she got into poison oak about 1 week ago while working in her yard.  It got on her legs and arms.  She started having itchy rash on the ankles and wrists and since then it is spread all the way up to the elbows and thighs.  She has noticed a few blisters.  Nothing looks infected.  She has been using Calamine lotion, cortisone cream, alcohol & bleach with no relief.    Outpatient Encounter Medications as of 01/09/2019  Medication Sig  . acetaminophen-codeine (TYLENOL #4) 300-60 MG tablet Take 1 tablet by mouth every 6 (six) hours as needed for moderate pain (each prescription to last 1 mth).  Marland Kitchen albuterol (PROVENTIL HFA;VENTOLIN HFA) 108 (90 Base) MCG/ACT inhaler Inhale 2 puffs into the lungs every 6 (six) hours as needed for wheezing or shortness of breath.  Marland Kitchen amLODipine (NORVASC) 10 MG tablet Take 1 tablet (10 mg total) by mouth daily.  Marland Kitchen atorvastatin (LIPITOR) 20 MG tablet Take 1 tablet (20 mg total) by mouth daily.  .  budesonide-formoterol (SYMBICORT) 80-4.5 MCG/ACT inhaler Take 2 puffs first thing in am and then another 2 puffs about 12 hours later.  . fluticasone (FLONASE) 50 MCG/ACT nasal spray Place 1 spray into both nostrils daily as needed for allergies or rhinitis.  Marland Kitchen glucose blood (TRUE METRIX BLOOD GLUCOSE TEST) test strip Use as instructed  . hydrochlorothiazide (HYDRODIURIL) 25 MG tablet Take 1 tablet (25 mg total) by mouth daily.  Marland Kitchen losartan (COZAAR) 50 MG tablet Take 1 tablet (50 mg total) by mouth daily.  . metFORMIN (GLUCOPHAGE) 500 MG tablet Take 1 tablet (500 mg total) by mouth 2 (two) times daily with a meal.  . nicotine (NICODERM CQ - DOSED IN MG/24 HOURS) 14 mg/24hr patch Place 1 patch (14 mg total) onto the skin daily.  . nitroGLYCERIN (NITROSTAT) 0.4 MG SL tablet Place 1 tablet (0.4 mg total) under the tongue every 5 (five) minutes as needed for chest pain.  . pregabalin (LYRICA) 75 MG capsule Take 1 capsule (75 mg total) by mouth 3 (three) times daily. (Patient taking differently: Take 75 mg by mouth daily. )  . TRUEPLUS LANCETS 28G MISC Use as directed  . [DISCONTINUED] Blood Glucose Monitoring Suppl (TRUE METRIX METER) w/Device KIT Use as directed  . [DISCONTINUED] chlorhexidine (PERIDEX) 0.12 % solution Use as directed 15 mLs in the mouth or throat 2 (two) times daily.  . [DISCONTINUED] methocarbamol (ROBAXIN) 500 MG tablet Take 1 tablet (500 mg total) by mouth 2 (two) times daily as needed for muscle spasms.  . [DISCONTINUED] potassium chloride SA (K-DUR,KLOR-CON) 20 MEQ tablet  TAKE ONE TABLET BY MOUTH EVERY DAY (Patient not taking: Reported on 10/23/2018)   No facility-administered encounter medications on file as of 01/09/2019.       Observations/Objective: No direct observation done as this was a telephone encounter.  Assessment and Plan: 1. Contact dermatitis due to poison oak Encourage patient to wear protective clothing and gloves when working with poison oak or poison ivy.   Stop all the other products that she has been using except the calamine lotion.  We will send prescription for clobetasol cream for her to use twice daily.  Advised that this is a high potency steroid cream and is not to be used on the face.  We will also give 3 days of oral prednisone. - predniSONE (DELTASONE) 20 MG tablet; Take 1 tablet (20 mg total) by mouth daily with breakfast.  Dispense: 3 tablet; Refill: 0 - clobetasol cream (TEMOVATE) 0.05 %; Apply 1 application topically 2 (two) times daily.  Dispense: 60 g; Refill: 0   Follow Up Instructions: Keep routine follow-up appointment with me coming up later this week.   I discussed the assessment and treatment plan with the patient. The patient was provided an opportunity to ask questions and all were answered. The patient agreed with the plan and demonstrated an understanding of the instructions.   The patient was advised to call back or seek an in-person evaluation if the symptoms worsen or if the condition fails to improve as anticipated.  I provided 7 minutes of non-face-to-face time during this encounter.   Karle Plumber, MD

## 2019-01-09 NOTE — Progress Notes (Signed)
Patient states that she got into poison oak about 1 week ago while working in her yard. It is on her legs & arms. She is using Calamine lotion, cortisone cream, alcohol & bleach with no relief. Some areas are trying to drain.

## 2019-01-11 ENCOUNTER — Ambulatory Visit (HOSPITAL_BASED_OUTPATIENT_CLINIC_OR_DEPARTMENT_OTHER): Payer: Medicaid Other | Admitting: Pharmacist

## 2019-01-11 ENCOUNTER — Ambulatory Visit: Payer: Medicaid Other | Attending: Internal Medicine | Admitting: Internal Medicine

## 2019-01-11 ENCOUNTER — Encounter: Payer: Self-pay | Admitting: Internal Medicine

## 2019-01-11 ENCOUNTER — Other Ambulatory Visit: Payer: Self-pay

## 2019-01-11 VITALS — BP 155/89 | HR 57 | Temp 98.1°F | Resp 16 | Wt 164.0 lb

## 2019-01-11 DIAGNOSIS — Z888 Allergy status to other drugs, medicaments and biological substances status: Secondary | ICD-10-CM | POA: Insufficient documentation

## 2019-01-11 DIAGNOSIS — Z79899 Other long term (current) drug therapy: Secondary | ICD-10-CM | POA: Diagnosis not present

## 2019-01-11 DIAGNOSIS — Z23 Encounter for immunization: Secondary | ICD-10-CM

## 2019-01-11 DIAGNOSIS — I10 Essential (primary) hypertension: Secondary | ICD-10-CM | POA: Insufficient documentation

## 2019-01-11 DIAGNOSIS — Z7951 Long term (current) use of inhaled steroids: Secondary | ICD-10-CM | POA: Diagnosis not present

## 2019-01-11 DIAGNOSIS — E119 Type 2 diabetes mellitus without complications: Secondary | ICD-10-CM

## 2019-01-11 DIAGNOSIS — Z1211 Encounter for screening for malignant neoplasm of colon: Secondary | ICD-10-CM

## 2019-01-11 DIAGNOSIS — E669 Obesity, unspecified: Secondary | ICD-10-CM | POA: Diagnosis not present

## 2019-01-11 DIAGNOSIS — F329 Major depressive disorder, single episode, unspecified: Secondary | ICD-10-CM | POA: Insufficient documentation

## 2019-01-11 DIAGNOSIS — Z7984 Long term (current) use of oral hypoglycemic drugs: Secondary | ICD-10-CM | POA: Diagnosis not present

## 2019-01-11 DIAGNOSIS — Z7952 Long term (current) use of systemic steroids: Secondary | ICD-10-CM | POA: Insufficient documentation

## 2019-01-11 DIAGNOSIS — G5692 Unspecified mononeuropathy of left upper limb: Secondary | ICD-10-CM

## 2019-01-11 DIAGNOSIS — Z981 Arthrodesis status: Secondary | ICD-10-CM | POA: Insufficient documentation

## 2019-01-11 DIAGNOSIS — E785 Hyperlipidemia, unspecified: Secondary | ICD-10-CM | POA: Diagnosis not present

## 2019-01-11 DIAGNOSIS — M25522 Pain in left elbow: Secondary | ICD-10-CM | POA: Diagnosis not present

## 2019-01-11 DIAGNOSIS — M5416 Radiculopathy, lumbar region: Secondary | ICD-10-CM | POA: Insufficient documentation

## 2019-01-11 DIAGNOSIS — G8929 Other chronic pain: Secondary | ICD-10-CM | POA: Diagnosis not present

## 2019-01-11 DIAGNOSIS — Z6829 Body mass index (BMI) 29.0-29.9, adult: Secondary | ICD-10-CM | POA: Insufficient documentation

## 2019-01-11 DIAGNOSIS — E1140 Type 2 diabetes mellitus with diabetic neuropathy, unspecified: Secondary | ICD-10-CM | POA: Diagnosis not present

## 2019-01-11 DIAGNOSIS — F17200 Nicotine dependence, unspecified, uncomplicated: Secondary | ICD-10-CM

## 2019-01-11 DIAGNOSIS — J449 Chronic obstructive pulmonary disease, unspecified: Secondary | ICD-10-CM | POA: Insufficient documentation

## 2019-01-11 DIAGNOSIS — Z8249 Family history of ischemic heart disease and other diseases of the circulatory system: Secondary | ICD-10-CM | POA: Diagnosis not present

## 2019-01-11 DIAGNOSIS — M25511 Pain in right shoulder: Secondary | ICD-10-CM | POA: Diagnosis not present

## 2019-01-11 DIAGNOSIS — F17210 Nicotine dependence, cigarettes, uncomplicated: Secondary | ICD-10-CM | POA: Insufficient documentation

## 2019-01-11 DIAGNOSIS — F32A Depression, unspecified: Secondary | ICD-10-CM

## 2019-01-11 LAB — POCT GLYCOSYLATED HEMOGLOBIN (HGB A1C): HbA1c, POC (prediabetic range): 5.7 % (ref 5.7–6.4)

## 2019-01-11 LAB — GLUCOSE, POCT (MANUAL RESULT ENTRY): POC Glucose: 106 mg/dl — AB (ref 70–99)

## 2019-01-11 MED ORDER — DULOXETINE HCL 20 MG PO CPEP: 20.0000 mg | ORAL_CAPSULE | Freq: Every day | ORAL | 3 refills | Status: DC

## 2019-01-11 MED ORDER — LOSARTAN POTASSIUM 100 MG PO TABS: 100.0000 mg | ORAL_TABLET | Freq: Every day | ORAL | 6 refills | Status: DC

## 2019-01-11 NOTE — Patient Instructions (Addendum)
Your blood pressure is not controlled.  We have increase the Cozaar to 100 mg daily.  We have added the medication called Cymbalta to help with the neuropathic pain in your left arm.    Influenza Virus Vaccine injection (Fluarix) What is this medicine? INFLUENZA VIRUS VACCINE (in floo EN zuh VAHY ruhs vak SEEN) helps to reduce the risk of getting influenza also known as the flu. This medicine may be used for other purposes; ask your health care provider or pharmacist if you have questions. COMMON BRAND NAME(S): Fluarix, Fluzone What should I tell my health care provider before I take this medicine? They need to know if you have any of these conditions:  bleeding disorder like hemophilia  fever or infection  Guillain-Barre syndrome or other neurological problems  immune system problems  infection with the human immunodeficiency virus (HIV) or AIDS  low blood platelet counts  multiple sclerosis  an unusual or allergic reaction to influenza virus vaccine, eggs, chicken proteins, latex, gentamicin, other medicines, foods, dyes or preservatives  pregnant or trying to get pregnant  breast-feeding How should I use this medicine? This vaccine is for injection into a muscle. It is given by a health care professional. A copy of Vaccine Information Statements will be given before each vaccination. Read this sheet carefully each time. The sheet may change frequently. Talk to your pediatrician regarding the use of this medicine in children. Special care may be needed. Overdosage: If you think you have taken too much of this medicine contact a poison control center or emergency room at once. NOTE: This medicine is only for you. Do not share this medicine with others. What if I miss a dose? This does not apply. What may interact with this medicine?  chemotherapy or radiation therapy  medicines that lower your immune system like etanercept, anakinra, infliximab, and  adalimumab  medicines that treat or prevent blood clots like warfarin  phenytoin  steroid medicines like prednisone or cortisone  theophylline  vaccines This list may not describe all possible interactions. Give your health care provider a list of all the medicines, herbs, non-prescription drugs, or dietary supplements you use. Also tell them if you smoke, drink alcohol, or use illegal drugs. Some items may interact with your medicine. What should I watch for while using this medicine? Report any side effects that do not go away within 3 days to your doctor or health care professional. Call your health care provider if any unusual symptoms occur within 6 weeks of receiving this vaccine. You may still catch the flu, but the illness is not usually as bad. You cannot get the flu from the vaccine. The vaccine will not protect against colds or other illnesses that may cause fever. The vaccine is needed every year. What side effects may I notice from receiving this medicine? Side effects that you should report to your doctor or health care professional as soon as possible:  allergic reactions like skin rash, itching or hives, swelling of the face, lips, or tongue Side effects that usually do not require medical attention (report to your doctor or health care professional if they continue or are bothersome):  fever  headache  muscle aches and pains  pain, tenderness, redness, or swelling at site where injected  weak or tired This list may not describe all possible side effects. Call your doctor for medical advice about side effects. You may report side effects to FDA at 1-800-FDA-1088. Where should I keep my medicine? This vaccine is  only given in a clinic, pharmacy, doctor's office, or other health care setting and will not be stored at home. NOTE: This sheet is a summary. It may not cover all possible information. If you have questions about this medicine, talk to your doctor, pharmacist,  or health care provider.  2020 Elsevier/Gold Standard (2007-10-11 09:30:40)

## 2019-01-11 NOTE — Progress Notes (Signed)
Patient presents for vaccination against influenza per orders of Dr. Johnson. Consent given. Counseling provided. No contraindications exists. Vaccine administered without incident.   

## 2019-01-11 NOTE — Progress Notes (Signed)
Patient ID: Charlene Allen, female    DOB: [birth year]  MRN: [id number]  CC: Diabetes and Hypertension   Subjective: Charlene Allen is a 63 y.o. female who presents for Her concerns today include:  Hx of chronic neck/lower back pain on Tylenol #4, Lyrica), HTN,DM,HL,tob dep, obesity, COPD, depression,S/p c-spine fusion 2018.  Poison Oak: Reports that she is doing much better with the clobetasol cream and prednisone.  DIABETES TYPE 2 Last A1C:   Results for orders placed or performed in visit on 01/11/19  POCT glucose (manual entry)  Result Value Ref Range   POC Glucose 106 (A) 70 - 99 mg/dl  POCT glycosylated hemoglobin (Hb A1C)  Result Value Ref Range   Hemoglobin A1C     HbA1c POC (<> result, manual entry)     HbA1c, POC (prediabetic range) 5.7 5.7 - 6.4 %   HbA1c, POC (controlled diabetic range)      Med Adherence:  [x]  Yes    []  No Medication side effects:  []  Yes    []  No Home Monitoring?  [x]  Yes  Once a day  []  No Home glucose results range: 120 Diet Adherence: [x]  Yes    []  No Exercise: [x]  Yes  -works in her yard 2-3 x a wk Hypoglycemic episodes?: []  Yes    [x]  No Numbness of the feet? []  Yes    [x]  No Retinopathy hx? []  Yes    []  No Last eye exam: Overdue.  Not able to see things well close up.  Agreeable to referral for eye exam.   Comments:   HYPERTENSION Currently taking: see medication list Med Adherence: [x]  Yes: She is on Norvasc, Cozaar and HCTZ    []  No Medication side effects: []  Yes    [x]  No Adherence with salt restriction: []  Yes    []  No Home Monitoring?: [x]  Yes but not often     Monitoring Frequency:  2 days ago was 120s/80s Home BP results range: []  Yes    []  No SOB? []  Yes    [x]  No Chest Pain?: []  Yes    [x]  No Leg swelling?: []  Yes    [x]  No Headaches?: []  Yes    [x]  No Dizziness? []  Yes    [x]  No Comments:   Tob dep:  1 cig a day.  Trying to quit.  [name] because daughter also smokes.  Using patches which are helpful.   Saw  Dr. Lorin Allen last month.  She informed him of the throbbing that she gets in her left arm that starts in the elbow and goes to the wrist and into the fourth and fifth fingers.  No numbness in the fingers.  Some numbness in the inner aspect of the forearm.  She has been having this pain and numbness ever since she had surgery on her neck.  She states Dr. Lorin Allen think she is depressed.  She admits that she is a little depressed.  She is not sleeping well.  She has 3 young grandkids in the house with her. Had EMG earlier this year and was told that it was okay  Back still bothersome.  She tells me that Dr. Lorin Allen feels things are not bad enough yet to warrant surgery.  She continues to take the Lyrica and Tylenol No. 4's which she finds helpful.  Denies any significant side effects from the medications.    HM: Mailed in her fit test in July but it was canceled due to the age of the specimen.  She is willing to do it again.  She is not interested in having colonoscopy.  MMG schedule Patient Active Problem List   Diagnosis Date Noted  . Hx of fusion of cervical spine 12/12/2018  . Controlled type 2 diabetes mellitus without complication, without long-term current use of insulin ([place]) 11/15/2017  . Lumbar radiculopathy, chronic 11/15/2017  . Increased endometrial stripe thickness 09/08/2016  . Chronic right shoulder pain 07/22/2016  . COPD GOLD 0 still smoking  06/18/2016  . HPV test positive 10/13/2015  . Mild diastolic dysfunction [id number]  . Chronic diarrhea 08/13/2015  . Cigarette smoker 08/13/2015  . Chronic back pain 03/15/2013  . Essential hypertension, benign 03/15/2013  . History of substance abuse ([place]) 03/15/2013     Current Outpatient Medications on File Prior to Visit  Medication Sig Dispense Refill  . acetaminophen-codeine (TYLENOL #4) 300-60 MG tablet Take 1 tablet by mouth every 6 (six) hours as needed for moderate pain (each prescription to last 1 mth). 120 tablet 0  . albuterol  (PROVENTIL HFA;VENTOLIN HFA) 108 (90 Base) MCG/ACT inhaler Inhale 2 puffs into the lungs every 6 (six) hours as needed for wheezing or shortness of breath. 1 Inhaler 3  . amLODipine (NORVASC) 10 MG tablet Take 1 tablet (10 mg total) by mouth daily. 30 tablet 3  . atorvastatin (LIPITOR) 20 MG tablet Take 1 tablet (20 mg total) by mouth daily. 90 tablet 1  . budesonide-formoterol (SYMBICORT) 80-4.5 MCG/ACT inhaler Take 2 puffs first thing in am and then another 2 puffs about 12 hours later. 1 Inhaler 11  . clobetasol cream (TEMOVATE) [id number] % Apply 1 application topically 2 (two) times daily. 60 g 0  . fluticasone (FLONASE) 50 MCG/ACT nasal spray Place 1 spray into both nostrils daily as needed for allergies or rhinitis.    Marland Kitchen glucose blood (TRUE METRIX BLOOD GLUCOSE TEST) test strip Use as instructed 100 each 12  . hydrochlorothiazide (HYDRODIURIL) 25 MG tablet Take 1 tablet (25 mg total) by mouth daily. 30 tablet 2  . metFORMIN (GLUCOPHAGE) 500 MG tablet Take 1 tablet (500 mg total) by mouth 2 (two) times daily with a meal. 180 tablet 0  . nicotine (NICODERM CQ - DOSED IN MG/24 HOURS) 14 mg/24hr patch Place 1 patch (14 mg total) onto the skin daily. 28 patch 1  . nitroGLYCERIN (NITROSTAT) 0.4 MG SL tablet Place 1 tablet (0.4 mg total) under the tongue every 5 (five) minutes as needed for chest pain.  12  . predniSONE (DELTASONE) 20 MG tablet Take 1 tablet (20 mg total) by mouth daily with breakfast. 3 tablet 0  . pregabalin (LYRICA) 75 MG capsule Take 1 capsule (75 mg total) by mouth 3 (three) times daily. (Patient taking differently: Take 75 mg by mouth daily. ) 90 capsule 1  . TRUEPLUS LANCETS 28G MISC Use as directed 100 each 12   No current facility-administered medications on file prior to visit.     Allergies  Allergen Reactions  . Lisinopril Nausea Only    Social History   Socioeconomic History  . Marital status: Married    Spouse name: Not on file  . Number of children: Not on file   . Years of education: Not on file  . Highest education level: Not on file  Occupational History  . Not on file  Social Needs  . Financial resource strain: Not on file  . Food insecurity    Worry: Not on file    Inability: Not on file  .  Transportation needs    Medical: Not on file    Non-medical: Not on file  Tobacco Use  . Smoking status: Current Every Day Smoker    Packs/day: 0.50    Years: 40.00    Pack years: 20.00    Types: Cigarettes  . Smokeless tobacco: Never Used  Substance and Sexual Activity  . Alcohol use: No    Alcohol/week: 0.0 standard drinks  . Drug use: No  . Sexual activity: Not Currently    Birth control/protection: None  Lifestyle  . Physical activity    Days per week: Not on file    Minutes per session: Not on file  . Stress: Not on file  Relationships  . Social Herbalist on phone: Not on file    Gets together: Not on file    Attends religious service: Not on file    Active member of club or organization: Not on file    Attends meetings of clubs or organizations: Not on file    Relationship status: Not on file  . Intimate partner violence    Fear of current or ex partner: Not on file    Emotionally abused: Not on file    Physically abused: Not on file    Forced sexual activity: Not on file  Other Topics Concern  . Not on file  Social History Narrative  . Not on file    Family History  Problem Relation Age of Onset  . COPD Mother   . Stroke Mother   . Heart disease Mother   . Hypertension Mother   . Cancer Sister 25       cervical cancer   . Stroke Maternal Grandmother     Past Surgical History:  Procedure Laterality Date  . ABDOMINAL AORTOGRAM N/A 07/12/2016   Procedure: Abdominal Aortogram;  Surgeon: Jettie Booze, MD;  Location: [facility];  Service: Cardiovascular;  Laterality: N/A;  . ANTERIOR CERVICAL DECOMP/DISCECTOMY FUSION N/A 09/05/2017   Procedure: C4-5, C5-6 ANTERIOR CERVICAL  DECOMPRESSION/DISCECTOMY FUSION, ALLOGRAFT, PLATE;  Surgeon: Marybelle Killings, MD;  Location: [facility];  Service: Orthopedics;  Laterality: N/A;  . HAND SURGERY    . LEFT HEART CATH AND CORONARY ANGIOGRAPHY N/A 07/12/2016   Procedure: Left Heart Cath and Coronary Angiography;  Surgeon: Jettie Booze, MD;  Location: [facility];  Service: Cardiovascular;  Laterality: N/A;  . TUBAL LIGATION      ROS: Review of Systems Negative except as stated above  PHYSICAL EXAM: BP (!) 155/89   Pulse (!) 57   Temp 98.1 F (36.7 C) (Oral)   Resp 16   Wt 164 lb (74.4 kg)   SpO2 98%   BMI 29.05 kg/m   Wt Readings from Last 3 Encounters:  01/11/19 164 lb (74.4 kg)  12/12/18 163 lb (73.9 kg)  09/28/18 164 lb (74.4 kg)    Physical Exam  General appearance - alert, well appearing, and in no distress Mental status - normal mood, behavior, speech, dress, motor activity, and thought processes Neck - supple, no significant adenopathy Chest - clear to auscultation, no wheezes, rales or rhonchi, symmetric air entry Heart - normal rate, regular rhythm, normal S1, S2, no murmurs, rubs, clicks or gallops Extremities - peripheral pulses normal, no pedal edema, no clubbing or cyanosis MSK: Mild tenderness on palpation over the medial epicondyle left elbow.  Mild discomfort with passive range of motion of the left elbow.  Power in the upper extremities 4+  out of 5 bilaterally.  No wasting of forearm muscles seen. Diabetic Foot Exam - Simple   Simple Foot Form Visual Inspection No deformities, no ulcerations, no other skin breakdown bilaterally: Yes Sensation Testing Intact to touch and monofilament testing bilaterally: Yes Pulse Check Posterior Tibialis and Dorsalis pulse intact bilaterally: Yes Comments     CMP Latest Ref Rng & Units 09/05/2017 09/01/2017 08/29/2017  Glucose 65 - 99 mg/dL 120(H) - 162(H)  BUN 6 - 20 mg/dL - - 9  Creatinine 0.44 - 1.00 mg/dL - - 0.75  Sodium 135 - 145 mmol/L 141 -  139  Potassium 3.5 - 5.1 mmol/L 3.1(L) 3.6 2.8(L)  Chloride 101 - 111 mmol/L - - 98(L)  CO2 22 - 32 mmol/L - - 27  Calcium 8.9 - 10.3 mg/dL - - 9.6  Total Protein 6.5 - 8.1 g/dL - - 7.6  Total Bilirubin 0.3 - 1.2 mg/dL - - 0.5  Alkaline Phos 38 - 126 U/L - - 94  AST 15 - 41 U/L - - 22  ALT 14 - 54 U/L - - 22   Lipid Panel     Component Value Date/Time   CHOL 197 08/12/2015 1145   TRIG 218 (H) 08/12/2015 1145   HDL 42 (L) 08/12/2015 1145   CHOLHDL 4.7 08/12/2015 1145   VLDL 44 (H) 08/12/2015 1145   LDLCALC 111 08/12/2015 1145    CBC    Component Value Date/Time   WBC 10.1 08/29/2017 0914   RBC 5.17 (H) 08/29/2017 0914   HGB 13.3 09/05/2017 1119   HGB 13.8 07/05/2016 1116   HCT 39.0 09/05/2017 1119   HCT 40.4 07/05/2016 1116   PLT 407 (H) 08/29/2017 0914   PLT 370 07/05/2016 1116   MCV 84.1 08/29/2017 0914   MCV 83 07/05/2016 1116   MCH 28.0 08/29/2017 0914   MCHC 33.3 08/29/2017 0914   RDW 12.7 08/29/2017 0914   RDW 13.6 07/05/2016 1116   LYMPHSABS 3.2 (H) 07/05/2016 1116   MONOABS 0.6 03/15/2013 1003   EOSABS 0.3 07/05/2016 1116   BASOSABS 0.0 07/05/2016 1116    ASSESSMENT AND PLAN: 1. Controlled type 2 diabetes mellitus without complication, without long-term current use of insulin (H[facility]) Continue metformin.  Encouraged her to continue healthy eating habits and try to stay active as much as her back will allow. - POCT glucose (manual entry) - POCT glycosylated hemoglobin (Hb A1C) - Ambulatory referral to Ophthalmology - CBC - Comprehensive metabolic panel - Lipid panel  2. Need for influenza vaccination Given  3. Tobacco dependence Commended her on how far she has come.  She is down to 1 cigarette a day.  Encouraged her to set a quit date.  Less than 5 minutes spent on counseling.  4. Essential hypertension Not at goal.  Increase Cozaar to 100 mg daily. - losartan (COZAAR) 100 MG tablet; Take 1 tablet (100 mg total) by mouth daily.  Dispense: 30 tablet;  Refill: 6  5. Depression, unspecified depression type This is mild.  However patient is willing to try Cymbalta to help not only with this but with some of the neuropathic pain that she is having in the left arm.  6. Left elbow pain - DG Elbow Complete Left; Future  7. Neuropathy, arm, left See #5 above - DULoxetine (CYMBALTA) 20 MG capsule; Take 1 capsule (20 mg total) by mouth at bedtime.  Dispense: 30 capsule; Refill: 3  8. Colon cancer screening - Fecal occult blood, imunochemical([facility])   Patient was  given the opportunity to ask questions.  Patient verbalized understanding of the plan and was able to repeat key elements of the plan.   Orders Placed This Encounter  Procedures  . Fecal occult blood, imunochemical([facility])  . DG Elbow Complete Left  . CBC  . Comprehensive metabolic panel  . Lipid panel  . Ambulatory referral to Ophthalmology  . POCT glucose (manual entry)  . POCT glycosylated hemoglobin (Hb A1C)     Requested Prescriptions   Signed Prescriptions Disp Refills  . DULoxetine (CYMBALTA) 20 MG capsule 30 capsule 3    Sig: Take 1 capsule (20 mg total) by mouth at bedtime.  Marland Kitchen losartan (COZAAR) 100 MG tablet 30 tablet 6    Sig: Take 1 tablet (100 mg total) by mouth daily.    Return in about 3 months (around 04/13/2019).  Karle Plumber, MD, FACP

## 2019-01-12 ENCOUNTER — Other Ambulatory Visit: Payer: Self-pay | Admitting: Internal Medicine

## 2019-01-12 DIAGNOSIS — E785 Hyperlipidemia, unspecified: Secondary | ICD-10-CM

## 2019-01-12 LAB — COMPREHENSIVE METABOLIC PANEL
ALT: 12 IU/L (ref 0–32)
AST: 15 IU/L (ref 0–40)
Albumin/Globulin Ratio: 2.4 — ABNORMAL HIGH (ref 1.2–2.2)
Albumin: 5.3 g/dL — ABNORMAL HIGH (ref 3.8–4.8)
Alkaline Phosphatase: 82 IU/L (ref 39–117)
BUN/Creatinine Ratio: 20 (ref 12–28)
BUN: 14 mg/dL (ref 8–27)
Bilirubin Total: <0.2 mg/dL (ref 0.0–1.2)
CO2: 24 mmol/L (ref 20–29)
Calcium: 10.3 mg/dL (ref 8.7–10.3)
Chloride: 101 mmol/L (ref 96–106)
Creatinine, Ser: 0.7 mg/dL (ref 0.57–1.00)
GFR calc Af Amer: 108 mL/min/{1.73_m2} (ref >59)
GFR calc non Af Amer: 94 mL/min/{1.73_m2} (ref >59)
Globulin, Total: 2.2 g/dL (ref 1.5–4.5)
Glucose: 86 mg/dL (ref 65–99)
Potassium: 3.7 mmol/L (ref 3.5–5.2)
Sodium: 142 mmol/L (ref 134–144)
Total Protein: 7.5 g/dL (ref 6.0–8.5)

## 2019-01-12 LAB — LIPID PANEL
Chol/HDL Ratio: 3.9 ratio (ref 0.0–4.4)
Cholesterol, Total: 214 mg/dL — ABNORMAL HIGH (ref 100–199)
HDL: 55 mg/dL (ref >39)
LDL Chol Calc (NIH): 116 mg/dL — ABNORMAL HIGH (ref 0–99)
Triglycerides: 248 mg/dL — ABNORMAL HIGH (ref 0–149)
VLDL Cholesterol Cal: 43 mg/dL — ABNORMAL HIGH (ref 5–40)

## 2019-01-12 LAB — CBC
Hematocrit: 43.6 % (ref 34.0–46.6)
Hemoglobin: 14.4 g/dL (ref 11.1–15.9)
MCH: 28.8 pg (ref 26.6–33.0)
MCHC: 33 g/dL (ref 31.5–35.7)
MCV: 87 fL (ref 79–97)
Platelets: 404 10*3/uL (ref 150–450)
RBC: 5 x10E6/uL (ref 3.77–5.28)
RDW: 13.5 % (ref 11.7–15.4)
WBC: 9.9 10*3/uL (ref 3.4–10.8)

## 2019-01-12 MED ORDER — ATORVASTATIN CALCIUM 20 MG PO TABS: 20.0000 mg | ORAL_TABLET | Freq: Every day | ORAL | 3 refills | Status: DC

## 2019-01-16 ENCOUNTER — Other Ambulatory Visit: Payer: Self-pay | Admitting: Internal Medicine

## 2019-01-16 ENCOUNTER — Telehealth: Payer: Self-pay | Admitting: Internal Medicine

## 2019-01-16 ENCOUNTER — Telehealth: Payer: Self-pay

## 2019-01-16 DIAGNOSIS — I10 Essential (primary) hypertension: Secondary | ICD-10-CM

## 2019-01-16 DIAGNOSIS — M5416 Radiculopathy, lumbar region: Secondary | ICD-10-CM

## 2019-01-16 MED ORDER — ACETAMINOPHEN-CODEINE #4 300-60 MG PO TABS: 1.0000 | ORAL_TABLET | Freq: Four times a day (QID) | ORAL | 0 refills | Status: DC | PRN

## 2019-01-16 NOTE — Addendum Note (Signed)
Addended by: Karle Plumber B on: 01/16/2019 12:43 PM   Modules accepted: Orders

## 2019-01-16 NOTE — Telephone Encounter (Signed)
New Message   1) Medication(s) Requested (by name): Tylenol 4  2) Pharmacy of Choice: [facility]  3) Special Requests:    Approved medications will be sent to the pharmacy, we will reach out if there is an issue.  Requests made after 3pm may not be addressed until the following business day!  If a patient is unsure of the name of the medication(s) please note and ask patient to call back when they are able to provide all info, do not send to responsible party until all information is available!

## 2019-01-16 NOTE — Telephone Encounter (Signed)
Contacted pt to go over lab results pt didn't answer lvm asking pt to give me a call at her earliest convenience  

## 2019-01-17 ENCOUNTER — Telehealth: Payer: Self-pay | Admitting: Internal Medicine

## 2019-01-17 NOTE — Telephone Encounter (Signed)
Patient called to check on the status of her results. Please follow up. °

## 2019-01-17 NOTE — Telephone Encounter (Signed)
Returned pt call. Pt didn;t answer left a detailed vm informing pt of results and if she has any questions or concerns to give me a call

## 2019-01-22 ENCOUNTER — Encounter: Payer: Medicaid Other | Admitting: Obstetrics and Gynecology

## 2019-01-22 ENCOUNTER — Encounter: Payer: Self-pay | Admitting: Obstetrics and Gynecology

## 2019-02-06 ENCOUNTER — Other Ambulatory Visit: Payer: Self-pay | Admitting: Internal Medicine

## 2019-02-06 DIAGNOSIS — F17200 Nicotine dependence, unspecified, uncomplicated: Secondary | ICD-10-CM

## 2019-02-16 LAB — FECAL OCCULT BLOOD, IMMUNOCHEMICAL

## 2019-02-20 ENCOUNTER — Telehealth: Payer: Self-pay

## 2019-02-20 NOTE — Telephone Encounter (Signed)
Contacted pt to go over lab results pt is aware that the specimen was old pt is going to come by and pick up another kit

## 2019-03-19 ENCOUNTER — Other Ambulatory Visit: Payer: Self-pay | Admitting: Internal Medicine

## 2019-03-19 DIAGNOSIS — M5416 Radiculopathy, lumbar region: Secondary | ICD-10-CM

## 2019-04-02 ENCOUNTER — Other Ambulatory Visit: Payer: Self-pay | Admitting: Internal Medicine

## 2019-04-02 DIAGNOSIS — G5692 Unspecified mononeuropathy of left upper limb: Secondary | ICD-10-CM

## 2019-04-09 ENCOUNTER — Other Ambulatory Visit: Payer: Self-pay | Admitting: Internal Medicine

## 2019-04-09 DIAGNOSIS — F17200 Nicotine dependence, unspecified, uncomplicated: Secondary | ICD-10-CM

## 2019-04-16 ENCOUNTER — Ambulatory Visit: Payer: Medicaid Other | Attending: Internal Medicine | Admitting: Internal Medicine

## 2019-04-16 ENCOUNTER — Other Ambulatory Visit: Payer: Self-pay | Admitting: Internal Medicine

## 2019-04-16 ENCOUNTER — Other Ambulatory Visit: Payer: Self-pay

## 2019-04-16 DIAGNOSIS — I10 Essential (primary) hypertension: Secondary | ICD-10-CM

## 2019-04-16 DIAGNOSIS — M5412 Radiculopathy, cervical region: Secondary | ICD-10-CM

## 2019-04-16 DIAGNOSIS — E119 Type 2 diabetes mellitus without complications: Secondary | ICD-10-CM | POA: Diagnosis not present

## 2019-04-16 DIAGNOSIS — R519 Headache, unspecified: Secondary | ICD-10-CM | POA: Diagnosis not present

## 2019-04-16 DIAGNOSIS — M5441 Lumbago with sciatica, right side: Secondary | ICD-10-CM

## 2019-04-16 DIAGNOSIS — M5442 Lumbago with sciatica, left side: Secondary | ICD-10-CM

## 2019-04-16 DIAGNOSIS — G8929 Other chronic pain: Secondary | ICD-10-CM

## 2019-04-16 DIAGNOSIS — F17200 Nicotine dependence, unspecified, uncomplicated: Secondary | ICD-10-CM

## 2019-04-16 MED ORDER — PREGABALIN 75 MG PO CAPS: 75.0000 mg | ORAL_CAPSULE | Freq: Three times a day (TID) | ORAL | 1 refills | Status: DC

## 2019-04-16 NOTE — Progress Notes (Signed)
Pt states she hurt her back again

## 2019-04-16 NOTE — Progress Notes (Signed)
Virtual Visit via Telephone Note Due to current restrictions/limitations of in-office visits due to the COVID-19 pandemic, this scheduled clinical appointment was converted to a telehealth visit  I connected with Charlene Allen on 04/16/19 at 10:34 a.m by telephone and verified that I am speaking with the correct person using two identifiers. I am in my office.  The patient is at home.  Only the patient and myself participated in this encounter.  I discussed the limitations, risks, security and privacy concerns of performing an evaluation and management service by telephone and the availability of in person appointments. I also discussed with the patient that there may be a patient responsible charge related to this service. The patient expressed understanding and agreed to proceed.   History of Present Illness: Hx of chronic neck/lower back pain on Tylenol #4, Lyrica), HTN,DM,HL,tob dep, obesity, COPD, depression,S/p c-spine fusion 2018.  Chronic lower back pain: Patient complains of flareup of radicular symptoms since Christmas.  1 day around that time, she spent 4 hours sitting on the floor playing with one of her grandchildren.  After that she started having intermittent shooting pains down both legs.  Pain is most noticeable "if I move my legs the wrong way.".  She denies any bowel or bladder incontinence.  No fever.  She has been using a heating pad.  She is on Tylenol No. 4.  She is also supposed to be on Lyrica but she thought the Lyrica was discontinued on last visit.  Complains of daily headaches for the past 2 months.  She states that she sometimes wakes up with a headache.  Headaches last about 30 minutes.  They go away and then can return later in the day after a few hours.  Headaches are over the forehead.  There is no associated blurred vision or photophobia.  Some nausea at times.  Headaches come on suddenly.  She has not identified any triggers.  Nothing makes it worse.  Better if  she lie down for a few minutes.  No previous history of headaches.  She has not checked her blood pressure in the last 2 weeks as she needs a new battery for her device.  About 2 weeks ago she had it checked at the fire department and it was 146/81.  Patient was started on Cymbalta on last visit for mild symptoms of depression and to help with radicular symptoms in the left arm.  HTN: Reports compliance with medications and low-salt diet.  No chest pains or shortness of breath.  No lower extremity edema.  DM: She checks blood sugars in the mornings about 2 to 3 days a week.  Range has been in the 120s.  Reports that her eating habits have been okay.  She did not get her eye exam done.  States that she would wait until the Covid pandemic is over.  HM: Fit test again was unable to be read due to age of the specimen.  Patient is not interested in having colonoscopy done.   Outpatient Encounter Medications as of 04/16/2019  Medication Sig  . acetaminophen-codeine (TYLENOL #4) 300-60 MG tablet Take 1 tablet by mouth every 6 (six) hours as needed for moderate pain (each prescription to last 1 mth).  Marland Kitchen albuterol (PROVENTIL HFA;VENTOLIN HFA) 108 (90 Base) MCG/ACT inhaler Inhale 2 puffs into the lungs every 6 (six) hours as needed for wheezing or shortness of breath.  Marland Kitchen amLODipine (NORVASC) 10 MG tablet Take 1 tablet (10 mg total) by mouth daily.  Marland Kitchen  atorvastatin (LIPITOR) 20 MG tablet Take 1 tablet (20 mg total) by mouth daily.  . budesonide-formoterol (SYMBICORT) 80-4.5 MCG/ACT inhaler Take 2 puffs first thing in am and then another 2 puffs about 12 hours later.  Marland Kitchen glucose blood (TRUE METRIX BLOOD GLUCOSE TEST) test strip Use as instructed  . hydrochlorothiazide (HYDRODIURIL) 25 MG tablet Take 1 tablet (25 mg total) by mouth daily.  Marland Kitchen losartan (COZAAR) 100 MG tablet Take 1 tablet (100 mg total) by mouth daily.  . metFORMIN (GLUCOPHAGE) 500 MG tablet Take 1 tablet (500 mg total) by mouth 2 (two) times daily  with a meal.  . nicotine (NICODERM CQ - DOSED IN MG/24 HOURS) 14 mg/24hr patch Place 1 patch (14 mg total) onto the skin daily.  . nitroGLYCERIN (NITROSTAT) 0.4 MG SL tablet Place 1 tablet (0.4 mg total) under the tongue every 5 (five) minutes as needed for chest pain.  . TRUEPLUS LANCETS 28G MISC Use as directed  . clobetasol cream (TEMOVATE) AB-123456789 % Apply 1 application topically 2 (two) times daily. (Patient not taking: Reported on 04/16/2019)  . DULoxetine (CYMBALTA) 20 MG capsule Take 1 capsule (20 mg total) by mouth at bedtime. (Patient not taking: Reported on 04/16/2019)  . fluticasone (FLONASE) 50 MCG/ACT nasal spray Place 1 spray into both nostrils daily as needed for allergies or rhinitis.  . predniSONE (DELTASONE) 20 MG tablet Take 1 tablet (20 mg total) by mouth daily with breakfast. (Patient not taking: Reported on 04/16/2019)  . pregabalin (LYRICA) 75 MG capsule Take 1 capsule (75 mg total) by mouth 3 (three) times daily. (Patient not taking: Reported on 04/16/2019)   No facility-administered encounter medications on file as of 04/16/2019.    Observations/Objective: Results for orders placed or performed in visit on 01/11/19  Fecal occult blood, imunochemical([facility])   Specimen: Blood   BLD  Result Value Ref Range   Fecal Occult Bld CANCELED   CBC  Result Value Ref Range   WBC 9.9 3.4 - 10.8 x10E3/uL   RBC 5.00 3.77 - 5.28 x10E6/uL   Hemoglobin 14.4 11.1 - 15.9 g/dL   Hematocrit 43.6 34.0 - 46.6 %   MCV 87 79 - 97 fL   MCH 28.8 26.6 - 33.0 pg   MCHC 33.0 31.5 - 35.7 g/dL   RDW 13.5 11.7 - 15.4 %   Platelets 404 150 - 450 x10E3/uL  Comprehensive metabolic panel  Result Value Ref Range   Glucose 86 65 - 99 mg/dL   BUN 14 8 - 27 mg/dL   Creatinine, Ser 0.70 0.57 - 1.00 mg/dL   GFR calc non Af Amer 94 >59 mL/min/1.73   GFR calc Af Amer 108 >59 mL/min/1.73   BUN/Creatinine Ratio 20 12 - 28   Sodium 142 134 - 144 mmol/L   Potassium 3.7 3.5 - 5.2 mmol/L   Chloride  101 96 - 106 mmol/L   CO2 24 20 - 29 mmol/L   Calcium 10.3 8.7 - 10.3 mg/dL   Total Protein 7.5 6.0 - 8.5 g/dL   Albumin 5.3 (H) 3.8 - 4.8 g/dL   Globulin, Total 2.2 1.5 - 4.5 g/dL   Albumin/Globulin Ratio 2.4 (H) 1.2 - 2.2   Bilirubin Total <0.2 0.0 - 1.2 mg/dL   Alkaline Phosphatase 82 39 - 117 IU/L   AST 15 0 - 40 IU/L   ALT 12 0 - 32 IU/L  Lipid panel  Result Value Ref Range   Cholesterol, Total 214 (H) 100 - 199 mg/dL   Triglycerides 248 (  H) 0 - 149 mg/dL   HDL 55 >39 mg/dL   VLDL Cholesterol Cal 43 (H) 5 - 40 mg/dL   LDL Chol Calc (NIH) 116 (H) 0 - 99 mg/dL   Chol/HDL Ratio 3.9 0.0 - 4.4 ratio  POCT glucose (manual entry)  Result Value Ref Range   POC Glucose 106 (A) 70 - 99 mg/dl  POCT glycosylated hemoglobin (Hb A1C)  Result Value Ref Range   Hemoglobin A1C     HbA1c POC (<> result, manual entry)     HbA1c, POC (prediabetic range) 5.7 5.7 - 6.4 %   HbA1c, POC (controlled diabetic range)      Assessment and Plan: 1. Chronic bilateral low back pain with bilateral sciatica -Advised patient to restart the Lyrica. She still has a bottle at home. We did not stop it on last visit.  She will continue the Lyrica and Tylenol with codeine.  Continue to use a heating pad.  If no improvement I recommend that she follow-up with ortho or we can refer for some P.T  2. Daily headache Differential diagnosis include cluster headache versus medication induced versus uncontrolled blood pressure Advised patient that one of the side effects of Cymbalta is that it can cause headache in 15-18% pts. since symptoms started shortly after start of Cymbalta, I recommend that we discontinue the Cymbalta.  She will wean off it by taking the tablet every other day for 1 week and then stop.  I also recommend that she get a battery for her blood pressure device and check the blood pressure at least 2-3 times a week with goal being 130/80 or lower.  If she is running much higher than that she will let me  know. -Follow-up if headaches persist despite these measures  3. Controlled type 2 diabetes mellitus without complication, without long-term current use of insulin ([place]) Reported blood sugars at goal.  Continue Metformin.  Healthy eating habits discussed and encouraged.  4. Essential hypertension Recommend getting a battery for her device and checking blood pressure 2-3 times a week with goal being 130/80 or lower.  Continue amlodipine, Cozaar and hydrochlorothiazide   Follow Up Instructions: 3 mths or sooner if headaches do not resolve with the measures discussed above.   I discussed the assessment and treatment plan with the patient. The patient was provided an opportunity to ask questions and all were answered. The patient agreed with the plan and demonstrated an understanding of the instructions.   The patient was advised to call back or seek an in-person evaluation if the symptoms worsen or if the condition fails to improve as anticipated.  I provided 20 minutes of non-face-to-face time during this encounter.   Karle Plumber, MD

## 2019-04-20 ENCOUNTER — Telehealth: Payer: Self-pay | Admitting: Internal Medicine

## 2019-04-20 DIAGNOSIS — M5416 Radiculopathy, lumbar region: Secondary | ICD-10-CM

## 2019-04-20 MED ORDER — ACETAMINOPHEN-CODEINE #4 300-60 MG PO TABS: 1.0000 | ORAL_TABLET | Freq: Four times a day (QID) | ORAL | 0 refills | Status: DC | PRN

## 2019-04-20 NOTE — Telephone Encounter (Signed)
1) Medication(s) Requested (by name): acetaminophen-codeine (TYLENOL #4) 300-60 MG tablet    2) Pharmacy of Choice: [facility]   3) Special Requests:   Approved medications will be sent to the pharmacy, we will reach out if there is an issue.  Requests made after 3pm may not be addressed until the following business day!  If a patient is unsure of the name of the medication(s) please note and ask patient to call back when they are able to provide all info, do not send to responsible party until all information is available!

## 2019-04-30 ENCOUNTER — Other Ambulatory Visit: Payer: Self-pay | Admitting: Primary Care

## 2019-04-30 DIAGNOSIS — I10 Essential (primary) hypertension: Secondary | ICD-10-CM

## 2019-04-30 NOTE — Telephone Encounter (Signed)
Amlodipine last prescribed in April of 2020 with only 3 refills. Your last OV note states for her to continue taking amlodipine. Unclear if patient taking as directed. Please fill if appropriate.

## 2019-05-14 ENCOUNTER — Other Ambulatory Visit: Payer: Self-pay | Admitting: Internal Medicine

## 2019-05-14 DIAGNOSIS — G8929 Other chronic pain: Secondary | ICD-10-CM

## 2019-05-21 ENCOUNTER — Other Ambulatory Visit: Payer: Self-pay | Admitting: Internal Medicine

## 2019-05-21 ENCOUNTER — Telehealth: Payer: Self-pay | Admitting: Internal Medicine

## 2019-05-21 DIAGNOSIS — I10 Essential (primary) hypertension: Secondary | ICD-10-CM

## 2019-05-21 DIAGNOSIS — M5416 Radiculopathy, lumbar region: Secondary | ICD-10-CM

## 2019-05-21 MED ORDER — METFORMIN HCL 500 MG PO TABS: 500.0000 mg | ORAL_TABLET | Freq: Two times a day (BID) | ORAL | 0 refills | Status: DC

## 2019-05-21 MED ORDER — HYDROCHLOROTHIAZIDE 25 MG PO TABS: 25.0000 mg | ORAL_TABLET | Freq: Every day | ORAL | 2 refills | Status: AC

## 2019-05-21 NOTE — Telephone Encounter (Signed)
Patient called and requested for listed medications to be refilled and sent to [facility]. Patient requested for prior auth through [facility] for a lower medication cost.

## 2019-05-22 ENCOUNTER — Other Ambulatory Visit: Payer: Self-pay | Admitting: Internal Medicine

## 2019-05-22 ENCOUNTER — Telehealth: Payer: Self-pay

## 2019-05-22 DIAGNOSIS — M5416 Radiculopathy, lumbar region: Secondary | ICD-10-CM

## 2019-05-22 NOTE — Telephone Encounter (Signed)
Tylenol #4 prior auth approved thru 11/18/2019

## 2019-05-22 NOTE — Telephone Encounter (Signed)
Dr Wynetta Emery -   PA for month supply approved for Tylenol #4. Can you approve/send this new rx? Talked to pharmacist at [facility] and they need a new rx.

## 2019-05-22 NOTE — Telephone Encounter (Signed)
Information given to [facility]. Waiting for approval.

## 2019-05-24 ENCOUNTER — Other Ambulatory Visit: Payer: Self-pay | Admitting: Internal Medicine

## 2019-05-24 DIAGNOSIS — F17200 Nicotine dependence, unspecified, uncomplicated: Secondary | ICD-10-CM

## 2019-06-18 ENCOUNTER — Telehealth: Payer: Self-pay | Admitting: Internal Medicine

## 2019-06-18 ENCOUNTER — Other Ambulatory Visit: Payer: Self-pay | Admitting: Internal Medicine

## 2019-06-18 DIAGNOSIS — M5416 Radiculopathy, lumbar region: Secondary | ICD-10-CM

## 2019-06-18 MED ORDER — ACETAMINOPHEN-CODEINE #4 300-60 MG PO TABS: 1.0000 | ORAL_TABLET | Freq: Four times a day (QID) | ORAL | 0 refills | Status: DC | PRN

## 2019-06-18 NOTE — Telephone Encounter (Signed)
Patient called and requested for listed medications to be refilled. Please follow up at your earliest convenience.    acetaminophen-codeine (TYLENOL #4) 300-60 MG tablet [id number]

## 2019-06-27 ENCOUNTER — Other Ambulatory Visit: Payer: Self-pay | Admitting: Internal Medicine

## 2019-06-27 DIAGNOSIS — G5692 Unspecified mononeuropathy of left upper limb: Secondary | ICD-10-CM

## 2019-07-09 ENCOUNTER — Other Ambulatory Visit: Payer: Self-pay | Admitting: Internal Medicine

## 2019-07-09 DIAGNOSIS — G8929 Other chronic pain: Secondary | ICD-10-CM

## 2019-07-09 DIAGNOSIS — M5442 Lumbago with sciatica, left side: Secondary | ICD-10-CM

## 2019-07-10 ENCOUNTER — Other Ambulatory Visit: Payer: Self-pay | Admitting: Internal Medicine

## 2019-07-17 ENCOUNTER — Encounter: Payer: Self-pay | Admitting: Internal Medicine

## 2019-07-17 ENCOUNTER — Other Ambulatory Visit: Payer: Self-pay

## 2019-07-17 ENCOUNTER — Ambulatory Visit: Payer: Medicaid Other | Attending: Internal Medicine | Admitting: Internal Medicine

## 2019-07-17 DIAGNOSIS — F17200 Nicotine dependence, unspecified, uncomplicated: Secondary | ICD-10-CM | POA: Diagnosis not present

## 2019-07-17 DIAGNOSIS — I10 Essential (primary) hypertension: Secondary | ICD-10-CM | POA: Diagnosis not present

## 2019-07-17 DIAGNOSIS — Z79899 Other long term (current) drug therapy: Secondary | ICD-10-CM

## 2019-07-17 DIAGNOSIS — E1169 Type 2 diabetes mellitus with other specified complication: Secondary | ICD-10-CM | POA: Diagnosis not present

## 2019-07-17 DIAGNOSIS — E119 Type 2 diabetes mellitus without complications: Secondary | ICD-10-CM

## 2019-07-17 DIAGNOSIS — Z9181 History of falling: Secondary | ICD-10-CM

## 2019-07-17 DIAGNOSIS — E785 Hyperlipidemia, unspecified: Secondary | ICD-10-CM | POA: Diagnosis not present

## 2019-07-17 DIAGNOSIS — M5416 Radiculopathy, lumbar region: Secondary | ICD-10-CM

## 2019-07-17 DIAGNOSIS — Z1211 Encounter for screening for malignant neoplasm of colon: Secondary | ICD-10-CM

## 2019-07-17 MED ORDER — ACETAMINOPHEN-CODEINE #4 300-60 MG PO TABS: 1.0000 | ORAL_TABLET | Freq: Four times a day (QID) | ORAL | 0 refills | Status: DC | PRN

## 2019-07-17 NOTE — Progress Notes (Signed)
Virtual Visit via Telephone Note Due to current restrictions/limitations of in-office visits due to the COVID-19 pandemic, this scheduled clinical appointment was converted to a telehealth visit  I connected with Charlene Allen on 07/17/19 at 9:32 a.m by telephone and verified that I am speaking with the correct person using two identifiers. I am in my office.  The patient is at home.  Only the patient and myself participated in this encounter.  I discussed the limitations, risks, security and privacy concerns of performing an evaluation and management service by telephone and the availability of in person appointments. I also discussed with the patient that there may be a patient responsible charge related to this service. The patient expressed understanding and agreed to proceed.   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 Last eval 03/2019.  Reports having a few falls since last visit. Last fall occurred 2 days ago.  Was throwing water ballons with her grand-kids and fell face down.  Thinks she may have tripped.  Also fell one mth ago while she was trying to shovel a hole to plant a bush. Reports her back has been very bothersome in the past 1 mth. Doing okay on Lyrica and Tylenol #4.  HYPERTENSION Currently taking: see medication list Med Adherence: [x]  Yes - reports that [facility] just sent her the increase dose of Cozaar of 100mg .  I had raised it since 12/2019.  However she reports HA in the mornings when she takes the higher dose; wears off after a few hrs Medication side effects: [x]  Yes HA with Cozaar    []  No Adherence with salt restriction: [x]  Yes    []  No Home Monitoring?: [x]  Yes    []  No Monitoring Frequency: []  Yes    []  No Home BP results range:  140-150/80s SOB? []  Yes    [x]  No Chest Pain?: []  Yes    [x]  No Leg swelling?: []  Yes    [x]  No Headaches?: [x]  Yes    []  No Dizziness? []  Yes    [x]  No Comments:   DIABETES TYPE 2 Last A1C:   5.7 12/2018 Med Adherence:  [x]  Yes    []  No Medication side effects:  []  Yes    [x]  No Home Monitoring?  [x]  Yes daily in mornings    []  No Home glucose results range: 120-130 Diet Adherence: [x]  Yes    []  No Exercise: [x]  Yes - works out in her yard most days   Hypoglycemic episodes?: []  Yes    [x]  No Numbness of the feet? []  Yes    [x]  No but they do tingle at times Retinopathy hx? []  Yes    []  No Last eye exam:  Comments:   Tob dep:  Down to 1 cig/day.  Not sure when she will quit completely.  Using patches consistently.   HL:  Compliant with Lipitor  HM: had 1st of Moderna vaccine on 06/22/2019 and 2nd one scheduled for 07/20/2019 Due for colon CA screen. FIT test keeps coming back unable to read.  Brother recently dx with colon CA at age 73.  Outpatient Encounter Medications as of 07/17/2019  Medication Sig  . acetaminophen-codeine (TYLENOL #4) 300-60 MG tablet Take 1 tablet by mouth every 6 (six) hours as needed for moderate pain (each prescription to last 1 mth).  Marland Kitchen albuterol (  PROVENTIL HFA;VENTOLIN HFA) 108 (90 Base) MCG/ACT inhaler Inhale 2 puffs into the lungs every 6 (six) hours as needed for wheezing or shortness of breath.  Marland Kitchen amLODipine (NORVASC) 10 MG tablet Take 1 tablet (10 mg total) by mouth daily.  Marland Kitchen atorvastatin (LIPITOR) 20 MG tablet Take 1 tablet (20 mg total) by mouth daily.  . budesonide-formoterol (SYMBICORT) 80-4.5 MCG/ACT inhaler Take 2 puffs first thing in am and then another 2 puffs about 12 hours later.  . clobetasol cream (TEMOVATE) AB-123456789 % Apply 1 application topically 2 (two) times daily. (Patient not taking: Reported on 04/16/2019)  . fluticasone (FLONASE) 50 MCG/ACT nasal spray Place 1 spray into both nostrils daily as needed for allergies or rhinitis.  Marland Kitchen  glucose blood (TRUE METRIX BLOOD GLUCOSE TEST) test strip Use as instructed  . hydrochlorothiazide (HYDRODIURIL) 25 MG tablet Take 1 tablet (25 mg total) by mouth daily.  Marland Kitchen losartan (COZAAR) 100 MG tablet Take 1 tablet (100 mg total) by mouth daily.  . metFORMIN (GLUCOPHAGE) 500 MG tablet Take 1 tablet (500 mg total) by mouth 2 (two) times daily with a meal.  . NICOTINE STEP 2 14 MG/24HR patch Place 1 patch (14 mg total) onto the skin daily.  . nitroGLYCERIN (NITROSTAT) 0.4 MG SL tablet Place 1 tablet (0.4 mg total) under the tongue every 5 (five) minutes as needed for chest pain.  . pregabalin (LYRICA) 75 MG capsule Take 1 capsule (75 mg total) by mouth 3 (three) times daily.  . TRUEPLUS LANCETS 28G MISC Use as directed   No facility-administered encounter medications on file as of 07/17/2019.      Observations/Objective: Results for orders placed or performed in visit on 01/11/19  Fecal occult blood, imunochemical([facility])   Specimen: Blood   BLD  Result Value Ref Range   Fecal Occult Bld CANCELED   CBC  Result Value Ref Range   WBC 9.9 3.4 - 10.8 x10E3/uL   RBC 5.00 3.77 - 5.28 x10E6/uL   Hemoglobin 14.4 11.1 - 15.9 g/dL   Hematocrit 43.6 34.0 - 46.6 %   MCV 87 79 - 97 fL   MCH 28.8 26.6 - 33.0 pg   MCHC 33.0 31.5 - 35.7 g/dL   RDW 13.5 11.7 - 15.4 %   Platelets 404 150 - 450 x10E3/uL  Comprehensive metabolic panel  Result Value Ref Range   Glucose 86 65 - 99 mg/dL   BUN 14 8 - 27 mg/dL   Creatinine, Ser 0.70 0.57 - 1.00 mg/dL   GFR calc non Af Amer 94 >59 mL/min/1.73   GFR calc Af Amer 108 >59 mL/min/1.73   BUN/Creatinine Ratio 20 12 - 28   Sodium 142 134 - 144 mmol/L   Potassium 3.7 3.5 - 5.2 mmol/L   Chloride 101 96 - 106 mmol/L   CO2 24 20 - 29 mmol/L   Calcium 10.3 8.7 - 10.3 mg/dL   Total Protein 7.5 6.0 - 8.5 g/dL   Albumin 5.3 (H) 3.8 - 4.8 g/dL   Globulin, Total 2.2 1.5 - 4.5 g/dL   Albumin/Globulin Ratio 2.4 (H) 1.2 - 2.2   Bilirubin Total <0.2 0.0  - 1.2 mg/dL   Alkaline Phosphatase 82 39 - 117 IU/L   AST 15 0 - 40 IU/L   ALT 12 0 - 32 IU/L  Lipid panel  Result Value Ref Range   Cholesterol, Total 214 (H) 100 - 199 mg/dL   Triglycerides 248 (H) 0 - 149 mg/dL   HDL 55 >39 mg/dL  VLDL Cholesterol Cal 43 (H) 5 - 40 mg/dL   LDL Chol Calc (NIH) 116 (H) 0 - 99 mg/dL   Chol/HDL Ratio 3.9 0.0 - 4.4 ratio  POCT glucose (manual entry)  Result Value Ref Range   POC Glucose 106 (A) 70 - 99 mg/dl  POCT glycosylated hemoglobin (Hb A1C)  Result Value Ref Range   Hemoglobin A1C     HbA1c POC (<> result, manual entry)     HbA1c, POC (prediabetic range) 5.7 5.7 - 6.4 %   HbA1c, POC (controlled diabetic range)       Assessment and Plan: 1. Controlled type 2 diabetes mellitus without complication, without long-term current use of insulin ([facility]) Continue Metformin, healthy eating habits and regular exercise. - Hemoglobin A1c; Future  2. Essential hypertension Not at goal.  Since she reports headaches with the increased dose of Cozaar, I recommend that we decrease the Cozaar back to 50 mg and add another oral agent.  However patient states that she does not want to be placed on another blood pressure medication at this time and she prefers to stick with the Cozaar 100 mg.  She will continue the other oral meds including hydrochlorothiazide and amlodipine  3. Lumbar radiculopathy, chronic Advised to avoid activities that she knows will aggravate her back. Refill given on Tylenol with codeine.  She is overdue for controlled substance prescribing agreement update.  When she comes to the lab next week to have blood test done I will have my CMA do the updated agreement with her.  I will also order urine drug screen to be done on that lab visit. [place] controlled substance reporting system reviewed today. - acetaminophen-codeine (TYLENOL #4) 300-60 MG tablet; Take 1 tablet by mouth every 6 (six) hours as needed for moderate pain (each  prescription to last 1 mth). Fill on or after 07/21/2019  Dispense: 120 tablet; Refill: 0  4. History of recent fall See #3 above  5. Tobacco dependence Commended her that she is down to 1 cigarette a day.  Encouraged her to set a quit date.  Less than 5 minutes spent on counseling.  6. Screening for colon cancer Since we have failed to get an adequate specimen for FIT testing, I discussed other methods of screening.  Patient has been reluctant for colonoscopy in the past.  She is agreeable to doing the Cologuard test. - Cologuard  7. Hyperlipidemia associated with type 2 diabetes mellitus ([place]) - Lipid panel; Future  8. Controlled substance agreement signed We will have her update the controlled substance prescribing agreement next week when she comes to the lab. [id number] 11+Oxyco+Alc+Crt-Bund; Future   Follow Up Instructions: 3 mths in person   I discussed the assessment and treatment plan with the patient. The patient was provided an opportunity to ask questions and all were answered. The patient agreed with the plan and demonstrated an understanding of the instructions.   The patient was advised to call back or seek an in-person evaluation if the symptoms worsen or if the condition fails to improve as anticipated.  I provided 20 minutes of non-face-to-face time during this encounter.   Karle Plumber, MD

## 2019-07-23 ENCOUNTER — Other Ambulatory Visit: Payer: Medicaid Other

## 2019-08-08 DIAGNOSIS — Z1211 Encounter for screening for malignant neoplasm of colon: Secondary | ICD-10-CM | POA: Diagnosis not present

## 2019-08-09 ENCOUNTER — Ambulatory Visit: Payer: Medicaid Other | Attending: Internal Medicine

## 2019-08-09 ENCOUNTER — Other Ambulatory Visit: Payer: Self-pay

## 2019-08-09 DIAGNOSIS — E1169 Type 2 diabetes mellitus with other specified complication: Secondary | ICD-10-CM

## 2019-08-09 DIAGNOSIS — E785 Hyperlipidemia, unspecified: Secondary | ICD-10-CM | POA: Diagnosis not present

## 2019-08-09 DIAGNOSIS — Z79899 Other long term (current) drug therapy: Secondary | ICD-10-CM

## 2019-08-09 DIAGNOSIS — E119 Type 2 diabetes mellitus without complications: Secondary | ICD-10-CM

## 2019-08-10 ENCOUNTER — Other Ambulatory Visit: Payer: Self-pay

## 2019-08-10 LAB — DRUG SCREEN 764883 11+OXYCO+ALC+CRT-BUND
Amphetamines, Urine: NEGATIVE ng/mL
BENZODIAZ UR QL: NEGATIVE ng/mL
Barbiturate: NEGATIVE ng/mL
Cannabinoid Quant, Ur: NEGATIVE ng/mL
Cocaine (Metabolite): NEGATIVE ng/mL
Creatinine: 165.1 mg/dL (ref 20.0–300.0)
Ethanol: NEGATIVE %
Meperidine: NEGATIVE ng/mL
Methadone Screen, Urine: NEGATIVE ng/mL
OPIATE SCREEN URINE: NEGATIVE ng/mL
Oxycodone/Oxymorphone, Urine: NEGATIVE ng/mL
Phencyclidine: NEGATIVE ng/mL
Propoxyphene: NEGATIVE ng/mL
Tramadol: NEGATIVE ng/mL
pH, Urine: 5.6 (ref 4.5–8.9)

## 2019-08-10 LAB — LIPID PANEL
Chol/HDL Ratio: 2.7 ratio (ref 0.0–4.4)
Cholesterol, Total: 139 mg/dL (ref 100–199)
HDL: 52 mg/dL (ref >39)
LDL Chol Calc (NIH): 60 mg/dL (ref 0–99)
Triglycerides: 162 mg/dL — ABNORMAL HIGH (ref 0–149)
VLDL Cholesterol Cal: 27 mg/dL (ref 5–40)

## 2019-08-10 LAB — HEMOGLOBIN A1C
Est. average glucose Bld gHb Est-mCnc: 111 mg/dL
Hgb A1c MFr Bld: 5.5 % (ref 4.8–5.6)

## 2019-08-11 NOTE — Progress Notes (Signed)
Let patient know that her A1c is less than 7 meaning her diabetes is under good control.  Cholesterol level is good.  Urine did not contain any codeine.  We will need to discuss this further before any further refills are given on Tylenol #4.

## 2019-08-15 LAB — COLOGUARD: Cologuard: POSITIVE — AB

## 2019-08-21 ENCOUNTER — Telehealth: Payer: Self-pay

## 2019-08-21 NOTE — Telephone Encounter (Signed)
Pt requesting to come in the morning for a urine test. Please request order from Dr. Wynetta Emery for the urine test.

## 2019-08-21 NOTE — Telephone Encounter (Signed)
Please contact pt and see why she is needing the urine

## 2019-08-22 ENCOUNTER — Telehealth: Payer: Self-pay | Admitting: Internal Medicine

## 2019-08-22 DIAGNOSIS — R195 Other fecal abnormalities: Secondary | ICD-10-CM

## 2019-08-22 NOTE — Telephone Encounter (Signed)
Called pt LVM 08/21/19 @5 :25 pm.  Called pt again this morning LVM.

## 2019-08-23 ENCOUNTER — Other Ambulatory Visit: Payer: Self-pay

## 2019-08-23 ENCOUNTER — Other Ambulatory Visit: Payer: Self-pay | Admitting: Internal Medicine

## 2019-08-23 DIAGNOSIS — M5416 Radiculopathy, lumbar region: Secondary | ICD-10-CM

## 2019-08-24 ENCOUNTER — Encounter: Payer: Self-pay | Admitting: Gastroenterology

## 2019-08-24 NOTE — Telephone Encounter (Signed)
Contacted pt and went over Cologuard results. Pt is aware. Pt states she is okay with the referral being submitted. Pt is schedule with pcp for 6/17 for an in person to discuss her urine for tylenol 4

## 2019-08-29 ENCOUNTER — Other Ambulatory Visit: Payer: Self-pay | Admitting: Internal Medicine

## 2019-08-29 DIAGNOSIS — I10 Essential (primary) hypertension: Secondary | ICD-10-CM

## 2019-09-05 ENCOUNTER — Other Ambulatory Visit: Payer: Self-pay | Admitting: Internal Medicine

## 2019-09-05 DIAGNOSIS — M5442 Lumbago with sciatica, left side: Secondary | ICD-10-CM

## 2019-09-07 NOTE — Telephone Encounter (Signed)
Will route request to provider covering for Dr. Wynetta Emery.

## 2019-09-12 ENCOUNTER — Encounter: Payer: Self-pay | Admitting: Gastroenterology

## 2019-09-12 ENCOUNTER — Ambulatory Visit: Payer: Medicaid Other | Admitting: Gastroenterology

## 2019-09-12 VITALS — BP 110/62 | HR 65 | Ht 63.0 in | Wt 163.8 lb

## 2019-09-12 DIAGNOSIS — R195 Other fecal abnormalities: Secondary | ICD-10-CM

## 2019-09-12 MED ORDER — SUPREP BOWEL PREP KIT 17.5-3.13-1.6 GM/177ML PO SOLN
1.0000 | ORAL | 0 refills | Status: DC
Start: 2019-09-12 — End: 2020-08-05

## 2019-09-12 NOTE — Patient Instructions (Signed)
If you are age 63 or older, your body mass index should be between 23-30. Your Body mass index is 29.02 kg/m. If this is out of the aforementioned range listed, please consider follow up with your Primary Care Provider.  If you are age 58 or younger, your body mass index should be between 19-25. Your Body mass index is 29.02 kg/m. If this is out of the aformentioned range listed, please consider follow up with your Primary Care Provider.   You have been scheduled for a colonoscopy. Please follow written instructions given to you at your visit today.  Please pick up your prep supplies at the pharmacy within the next 1-3 days. If you use inhalers (even only as needed), please bring them with you on the day of your procedure.  Due to recent changes in healthcare laws, you may see the results of your imaging and laboratory studies on MyChart before your provider has had a chance to review them.  We understand that in some cases there may be results that are confusing or concerning to you. Not all laboratory results come back in the same time frame and the provider may be waiting for multiple results in order to interpret others.  Please give [place] 48 hours in order for your provider to thoroughly review all the results before contacting the office for clarification of your results.   Thank you for entrusting me with your care and choosing [facility].  Dr Ardis Hughs

## 2019-09-12 NOTE — Progress Notes (Signed)
HPI: This is a very pleasant 64 year old woman who was referred to me by Ladell Pier, MD  to evaluate Cologuard positive stool.    She recently had Cologuard colon cancer screening test and it was positive.  She has seen blood periodically in her stools.  Only a small amount, bright red blood, the last time was about 2 or 3 months ago.  Her weight is overall stable.  She does not have any significant abdominal pains.  Her brother was diagnosed with colon cancer a year ago.  She had a colonoscopy 15 or 20 years ago at [facility] gastro[facility]ology and believes that polyps were removed.  Old Data Reviewed:  Cologuard colon cancer screening test + May 2021    Review of systems: Pertinent positive and negative review of systems were noted in the above HPI section. All other review negative.   Past Medical History:  Diagnosis Date  . Diabetes mellitus without complication ([place])   . Hyperlipidemia   . Hypertension Dx 2015  . Obesity   . PMB (postmenopausal bleeding)     Past Surgical History:  Procedure Laterality Date  . ABDOMINAL AORTOGRAM N/A 07/12/2016   Procedure: Abdominal Aortogram;  Surgeon: Jettie Booze, MD;  Location: [facility];  Service: Cardiovascular;  Laterality: N/A;  . ANTERIOR CERVICAL DECOMP/DISCECTOMY FUSION N/A 09/05/2017   Procedure: C4-5, C5-6 ANTERIOR CERVICAL DECOMPRESSION/DISCECTOMY FUSION, ALLOGRAFT, PLATE;  Surgeon: Marybelle Killings, MD;  Location: [facility];  Service: Orthopedics;  Laterality: N/A;  . HAND SURGERY    . LEFT HEART CATH AND CORONARY ANGIOGRAPHY N/A 07/12/2016   Procedure: Left Heart Cath and Coronary Angiography;  Surgeon: Jettie Booze, MD;  Location: [facility];  Service: Cardiovascular;  Laterality: N/A;  . TUBAL LIGATION      Current Outpatient Medications  Medication Sig Dispense Refill  . albuterol (PROVENTIL HFA;VENTOLIN HFA) 108 (90 Base) MCG/ACT inhaler Inhale 2 puffs into the lungs every 6 (six) hours as  needed for wheezing or shortness of breath. 1 Inhaler 3  . amLODipine (NORVASC) 10 MG tablet Take 1 tablet (10 mg total) by mouth daily. 30 tablet 0  . atorvastatin (LIPITOR) 20 MG tablet Take 1 tablet (20 mg total) by mouth daily. 90 tablet 3  . budesonide-formoterol (SYMBICORT) 80-4.5 MCG/ACT inhaler Take 2 puffs first thing in am and then another 2 puffs about 12 hours later. 1 Inhaler 11  . glucose blood (TRUE METRIX BLOOD GLUCOSE TEST) test strip Use as instructed 100 each 12  . hydrochlorothiazide (HYDRODIURIL) 25 MG tablet Take 1 tablet (25 mg total) by mouth daily. 90 tablet 2  . losartan (COZAAR) 100 MG tablet Take 1 tablet (100 mg total) by mouth daily. 30 tablet 6  . metFORMIN (GLUCOPHAGE) 500 MG tablet Take 1 tablet (500 mg total) by mouth 2 (two) times daily with a meal. 60 tablet 0  . nitroGLYCERIN (NITROSTAT) 0.4 MG SL tablet Place 1 tablet (0.4 mg total) under the tongue every 5 (five) minutes as needed for chest pain.  12  . TRUEPLUS LANCETS 28G MISC Use as directed 100 each 12   No current facility-administered medications for this visit.    Allergies as of 09/12/2019 - Review Complete 09/12/2019  Allergen Reaction Noted  . Cymbalta [duloxetine hcl]  07/21/2017  . Lisinopril Nausea Only 11/22/2017    Family History  Problem Relation Age of Onset  . COPD Mother   . Stroke Mother   . Heart disease Mother   .  Hypertension Mother   . Cancer Sister 25       cervical cancer   . Stroke Maternal Grandmother   . Colon cancer Brother   . Colon polyps Brother     Social History   Socioeconomic History  . Marital status: Married    Spouse name: Not on file  . Number of children: Not on file  . Years of education: Not on file  . Highest education level: Not on file  Occupational History  . Not on file  Tobacco Use  . Smoking status: Current Every Day Smoker    Packs/day: 0.50    Years: 40.00    Pack years: 20.00    Types: Cigarettes  . Smokeless tobacco: Never  Used  Vaping Use  . Vaping Use: Never used  Substance and Sexual Activity  . Alcohol use: No    Alcohol/week: 0.0 standard drinks  . Drug use: No  . Sexual activity: Not Currently    Birth control/protection: None  Other Topics Concern  . Not on file  Social History Narrative  . Not on file   Social Determinants of Health   Financial Resource Strain:   . Difficulty of Paying Living Expenses:   Food Insecurity:   . Worried About Charity fundraiser in the Last Year:   . Arboriculturist in the Last Year:   Transportation Needs:   . Film/video editor (Medical):   Marland Kitchen Lack of Transportation (Non-Medical):   Physical Activity:   . Days of Exercise per Week:   . Minutes of Exercise per Session:   Stress:   . Feeling of Stress :   Social Connections:   . Frequency of Communication with Friends and Family:   . Frequency of Social Gatherings with Friends and Family:   . Attends Religious Services:   . Active Member of Clubs or Organizations:   . Attends Archivist Meetings:   Marland Kitchen Marital Status:   Intimate Partner Violence:   . Fear of Current or Ex-Partner:   . Emotionally Abused:   Marland Kitchen Physically Abused:   . Sexually Abused:      Physical Exam: BP 110/62   Pulse 65   Ht 5\' 3"  (1.6 m)   Wt 163 lb 12.8 oz (74.3 kg)   SpO2 94%   BMI 29.02 kg/m  Constitutional: generally well-appearing Psychiatric: alert and oriented x3 Eyes: extraocular movements intact Mouth: oral pharynx moist, no lesions Neck: supple no lymphadenopathy Cardiovascular: heart regular rate and rhythm Lungs: clear to auscultation bilaterally Abdomen: soft, nontender, nondistended, no obvious ascites, no peritoneal signs, normal bowel sounds Extremities: no lower extremity edema bilaterally Skin: no lesions on visible extremities   Assessment and plan: 64 y.o. female with positive Cologuard colon cancer screening test  I explained to her that Cologuard colon cancer screening test is  actually not appropriate for her given her family history of colon cancer.  I recommended a colonoscopy for her at her soonest convenience.  She understands that even if it is completely normal I would likely recommend another colonoscopy at 5-year interval given her family history of colon cancer, her brother.  I see no reason for any further blood tests or imaging studies prior to then.  Please see the "Patient Instructions" section for addition details about the plan.   Owens Loffler, MD [facility] 09/12/2019, 9:29 AM  Cc: Ladell Pier, MD  Total time on date of encounter was 40  minutes (this included time  spent preparing to see the patient reviewing records; obtaining and/or reviewing separately obtained history; performing a medically appropriate exam and/or evaluation; counseling and educating the patient and family if present; ordering medications, tests or procedures if applicable; and documenting clinical information in the health record).

## 2019-09-13 ENCOUNTER — Ambulatory Visit: Payer: Medicaid Other | Attending: Internal Medicine | Admitting: Internal Medicine

## 2019-09-13 ENCOUNTER — Encounter: Payer: Self-pay | Admitting: Internal Medicine

## 2019-09-13 ENCOUNTER — Other Ambulatory Visit: Payer: Self-pay

## 2019-09-13 VITALS — BP 157/77 | HR 64 | Temp 98.2°F | Resp 16 | Wt 165.4 lb

## 2019-09-13 DIAGNOSIS — R195 Other fecal abnormalities: Secondary | ICD-10-CM | POA: Diagnosis not present

## 2019-09-13 DIAGNOSIS — M5416 Radiculopathy, lumbar region: Secondary | ICD-10-CM

## 2019-09-13 MED ORDER — ONDANSETRON HCL 4 MG PO TABS: 4.0000 mg | ORAL_TABLET | Freq: Three times a day (TID) | ORAL | 0 refills | Status: AC | PRN

## 2019-09-13 NOTE — Progress Notes (Signed)
Patient ID: Charlene Allen, female    DOB: 09-21-1955  MRN: [id number]  CC: Medication Management   Subjective: Charlene Allen is a 64 y.o. female who presents for med management Her concerns today include:  Hx of chronic neck/lower back pain on Tylenol #4, Lyrica), HTN,DM,HL,tobdep, obesity, COPD, depression,S/p c-spine fusion 2018, chronic lower back pain with multilevel lumbar spondylosis and anterior listhesis at L4-L5 on MRI.  Pt wanting to discuss the fact that recent UDS was neg for Tylenol #4.  She has been on this medication for a while for lumbar and sometimes cervical radiculopathy.  We did urine drug screen 08/09/2019 and it was negative for codeine.  Patient states that she had been away from her house for 9 days helping her brother who had recently had hip surgery and forgot to take all of her medicines with her.  The day that she came to have the test done, she states that she had just returned from staying with her brother.  She did however request refill on the medication on the usual time schedule through the pharmacy.  She states that her back has been giving her health since she has been out of the medication.  +Cologuard:  Saw GI yesterday.  C-scope schedule. Afraid will have nausea when having to drink all of the prep.    Patient Active Problem List   Diagnosis Date Noted  . Hx of fusion of cervical spine 12/12/2018  . Controlled type 2 diabetes mellitus without complication, without long-term current use of insulin ([place]) 11/15/2017  . Lumbar radiculopathy, chronic 11/15/2017  . Increased endometrial stripe thickness 09/08/2016  . Chronic right shoulder pain 07/22/2016  . COPD GOLD 0 still smoking  06/18/2016  . HPV test positive 10/13/2015  . Mild diastolic dysfunction 59/93/5701  . Chronic diarrhea 08/13/2015  . Cigarette smoker 08/13/2015  . Chronic back pain 03/15/2013  . Essential hypertension, benign 03/15/2013  . History of substance abuse ([place])  03/15/2013     Current Outpatient Medications on File Prior to Visit  Medication Sig Dispense Refill  . albuterol (PROVENTIL HFA;VENTOLIN HFA) 108 (90 Base) MCG/ACT inhaler Inhale 2 puffs into the lungs every 6 (six) hours as needed for wheezing or shortness of breath. 1 Inhaler 3  . amLODipine (NORVASC) 10 MG tablet Take 1 tablet (10 mg total) by mouth daily. 30 tablet 0  . atorvastatin (LIPITOR) 20 MG tablet Take 1 tablet (20 mg total) by mouth daily. 90 tablet 3  . budesonide-formoterol (SYMBICORT) 80-4.5 MCG/ACT inhaler Take 2 puffs first thing in am and then another 2 puffs about 12 hours later. 1 Inhaler 11  . glucose blood (TRUE METRIX BLOOD GLUCOSE TEST) test strip Use as instructed 100 each 12  . hydrochlorothiazide (HYDRODIURIL) 25 MG tablet Take 1 tablet (25 mg total) by mouth daily. 90 tablet 2  . losartan (COZAAR) 100 MG tablet Take 1 tablet (100 mg total) by mouth daily. 30 tablet 6  . metFORMIN (GLUCOPHAGE) 500 MG tablet Take 1 tablet (500 mg total) by mouth 2 (two) times daily with a meal. 60 tablet 0  . Na Sulfate-K Sulfate-Mg Sulf (SUPREP BOWEL PREP KIT) 17.5-3.13-1.6 GM/177ML SOLN Take 1 kit by mouth as directed. 324 mL 0  . nitroGLYCERIN (NITROSTAT) 0.4 MG SL tablet Place 1 tablet (0.4 mg total) under the tongue every 5 (five) minutes as needed for chest pain.  12  . TRUEPLUS LANCETS 28G MISC Use as directed 100 each 12   No current facility-administered medications  on file prior to visit.    Allergies  Allergen Reactions  . Cymbalta [Duloxetine Hcl]     PUT ME IN A DAZE, headaches  . Lisinopril Nausea Only    Social History   Socioeconomic History  . Marital status: Married    Spouse name: Not on file  . Number of children: Not on file  . Years of education: Not on file  . Highest education level: Not on file  Occupational History  . Not on file  Tobacco Use  . Smoking status: Current Every Day Smoker    Packs/day: 0.50    Years: 40.00    Pack years:  20.00    Types: Cigarettes  . Smokeless tobacco: Never Used  Vaping Use  . Vaping Use: Never used  Substance and Sexual Activity  . Alcohol use: No    Alcohol/week: 0.0 standard drinks  . Drug use: No  . Sexual activity: Not Currently    Birth control/protection: None  Other Topics Concern  . Not on file  Social History Narrative  . Not on file   Social Determinants of Health   Financial Resource Strain:   . Difficulty of Paying Living Expenses:   Food Insecurity:   . Worried About Charity fundraiser in the Last Year:   . Arboriculturist in the Last Year:   Transportation Needs:   . Film/video editor (Medical):   Marland Kitchen Lack of Transportation (Non-Medical):   Physical Activity:   . Days of Exercise per Week:   . Minutes of Exercise per Session:   Stress:   . Feeling of Stress :   Social Connections:   . Frequency of Communication with Friends and Family:   . Frequency of Social Gatherings with Friends and Family:   . Attends Religious Services:   . Active Member of Clubs or Organizations:   . Attends Archivist Meetings:   Marland Kitchen Marital Status:   Intimate Partner Violence:   . Fear of Current or Ex-Partner:   . Emotionally Abused:   Marland Kitchen Physically Abused:   . Sexually Abused:     Family History  Problem Relation Age of Onset  . COPD Mother   . Stroke Mother   . Heart disease Mother   . Hypertension Mother   . Cancer Sister 25       cervical cancer   . Stroke Maternal Grandmother   . Colon cancer Brother   . Colon polyps Brother     Past Surgical History:  Procedure Laterality Date  . ABDOMINAL AORTOGRAM N/A 07/12/2016   Procedure: Abdominal Aortogram;  Surgeon: Jettie Booze, MD;  Location: [facility];  Service: Cardiovascular;  Laterality: N/A;  . ANTERIOR CERVICAL DECOMP/DISCECTOMY FUSION N/A 09/05/2017   Procedure: C4-5, C5-6 ANTERIOR CERVICAL DECOMPRESSION/DISCECTOMY FUSION, ALLOGRAFT, PLATE;  Surgeon: Marybelle Killings, MD;  Location:  [facility];  Service: Orthopedics;  Laterality: N/A;  . HAND SURGERY    . LEFT HEART CATH AND CORONARY ANGIOGRAPHY N/A 07/12/2016   Procedure: Left Heart Cath and Coronary Angiography;  Surgeon: Jettie Booze, MD;  Location: [facility];  Service: Cardiovascular;  Laterality: N/A;  . TUBAL LIGATION      ROS: Review of Systems Negative except as stated above  PHYSICAL EXAM: BP (!) 157/77   Pulse 64   Temp 98.2 F (36.8 C)   Resp 16   Wt 165 lb 6.4 oz (75 kg)   SpO2 98%   BMI  29.30 kg/m   Physical Exam  General appearance - alert, well appearing, and in no distress Mental status - normal mood, behavior, speech, dress, motor activity, and thought processes   CMP Latest Ref Rng & Units 01/11/2019 09/05/2017 09/01/2017  Glucose 65 - 99 mg/dL 86 120(H) -  BUN 8 - 27 mg/dL 14 - -  Creatinine 0.57 - 1.00 mg/dL 0.70 - -  Sodium 134 - 144 mmol/L 142 141 -  Potassium 3.5 - 5.2 mmol/L 3.7 3.1(L) 3.6  Chloride 96 - 106 mmol/L 101 - -  CO2 20 - 29 mmol/L 24 - -  Calcium 8.7 - 10.3 mg/dL 10.3 - -  Total Protein 6.0 - 8.5 g/dL 7.5 - -  Total Bilirubin 0.0 - 1.2 mg/dL <0.2 - -  Alkaline Phos 39 - 117 IU/L 82 - -  AST 0 - 40 IU/L 15 - -  ALT 0 - 32 IU/L 12 - -   Lipid Panel     Component Value Date/Time   CHOL 139 08/09/2019 0850   TRIG 162 (H) 08/09/2019 0850   HDL 52 08/09/2019 0850   CHOLHDL 2.7 08/09/2019 0850   CHOLHDL 4.7 08/12/2015 1145   VLDL 44 (H) 08/12/2015 1145   LDLCALC 60 08/09/2019 0850    CBC    Component Value Date/Time   WBC 9.9 01/11/2019 1212   WBC 10.1 08/29/2017 0914   RBC 5.00 01/11/2019 1212   RBC 5.17 (H) 08/29/2017 0914   HGB 14.4 01/11/2019 1212   HCT 43.6 01/11/2019 1212   PLT 404 01/11/2019 1212   MCV 87 01/11/2019 1212   MCH 28.8 01/11/2019 1212   MCH 28.0 08/29/2017 0914   MCHC 33.0 01/11/2019 1212   MCHC 33.3 08/29/2017 0914   RDW 13.5 01/11/2019 1212   LYMPHSABS 3.2 (H) 07/05/2016 1116   MONOABS 0.6 03/15/2013 1003   EOSABS  0.3 07/05/2016 1116   BASOSABS 0.0 07/05/2016 1116    ASSESSMENT AND PLAN:  1. Lumbar radiculopathy, chronic -Advised patient that I cannot refill the Tylenol #4 despite her explanation given that recent drug screen was negative for the medication.  Advised that I can refer her to a pain specialist which she reluctantly gave the okay to do. - Ambulatory referral to Pain Clinic  2. Positive colorectal cancer screening using Cologuard test I will prescribe some Zofran for her to use as needed if she has nausea when taking the prep for the colonoscopy.  It is scheduled for next week.    Patient was given the opportunity to ask questions.  Patient verbalized understanding of the plan and was able to repeat key elements of the plan.   Orders Placed This Encounter  Procedures  . Ambulatory referral to Pain Clinic     Requested Prescriptions   Signed Prescriptions Disp Refills  . ondansetron (ZOFRAN) 4 MG tablet 10 tablet 0    Sig: Take 1 tablet (4 mg total) by mouth every 8 (eight) hours as needed for nausea or vomiting.    No follow-ups on file.  Karle Plumber, MD, FACP

## 2019-09-17 ENCOUNTER — Encounter: Payer: Self-pay | Admitting: Gastroenterology

## 2019-09-17 ENCOUNTER — Other Ambulatory Visit: Payer: Self-pay

## 2019-09-17 ENCOUNTER — Other Ambulatory Visit: Payer: Self-pay | Admitting: Gastroenterology

## 2019-09-17 ENCOUNTER — Ambulatory Visit (AMBULATORY_SURGERY_CENTER): Payer: Medicaid Other | Admitting: Gastroenterology

## 2019-09-17 VITALS — BP 108/51 | HR 53 | Temp 97.7°F | Resp 17 | Ht 63.0 in | Wt 163.0 lb

## 2019-09-17 DIAGNOSIS — R195 Other fecal abnormalities: Secondary | ICD-10-CM

## 2019-09-17 DIAGNOSIS — D122 Benign neoplasm of ascending colon: Secondary | ICD-10-CM

## 2019-09-17 DIAGNOSIS — Z800 Family history of malignant neoplasm of digestive organs: Secondary | ICD-10-CM

## 2019-09-17 DIAGNOSIS — E669 Obesity, unspecified: Secondary | ICD-10-CM | POA: Diagnosis not present

## 2019-09-17 DIAGNOSIS — I10 Essential (primary) hypertension: Secondary | ICD-10-CM | POA: Diagnosis not present

## 2019-09-17 DIAGNOSIS — E119 Type 2 diabetes mellitus without complications: Secondary | ICD-10-CM | POA: Diagnosis not present

## 2019-09-17 DIAGNOSIS — D125 Benign neoplasm of sigmoid colon: Secondary | ICD-10-CM | POA: Diagnosis not present

## 2019-09-17 DIAGNOSIS — I2510 Atherosclerotic heart disease of native coronary artery without angina pectoris: Secondary | ICD-10-CM | POA: Diagnosis not present

## 2019-09-17 DIAGNOSIS — J449 Chronic obstructive pulmonary disease, unspecified: Secondary | ICD-10-CM | POA: Diagnosis not present

## 2019-09-17 MED ORDER — SODIUM CHLORIDE 0.9 % IV SOLN
500.0000 mL | Freq: Once | INTRAVENOUS | Status: DC
Start: 2019-09-17 — End: 2019-09-17

## 2019-09-17 NOTE — Progress Notes (Signed)
Report to PACU, RN, vss, BBS= Clear.  

## 2019-09-17 NOTE — Progress Notes (Signed)
She's had intermittent RLQ pains over the past 1-2 months. RLQ again this AM after completing her prep. Mildly tender on exam in RLQ today.

## 2019-09-17 NOTE — Op Note (Signed)
[facility] Patient Name: Charlene Allen Procedure Date: 09/17/2019 9:34 AM MRN: [id number] Endoscopist: Milus Banister , MD Age: 64 Referring MD:  Date of Birth: [birth year] Gender: Female Account #: [id number] Procedure:                Colonoscopy Indications:              Positive Cologuard test, brother was recently                            diagnosed with colon cancer, also this AM RLQ pains                            mild Medicines:                Monitored Anesthesia Care Procedure:                Pre-Anesthesia Assessment:                           - Prior to the procedure, a History and Physical                            was performed, and patient medications and                            allergies were reviewed. The patient's tolerance of                            previous anesthesia was also reviewed. The risks                            and benefits of the procedure and the sedation                            options and risks were discussed with the patient.                            All questions were answered, and informed consent                            was obtained. Prior Anticoagulants: The patient has                            taken no previous anticoagulant or antiplatelet                            agents. ASA Grade Assessment: II - A patient with                            mild systemic disease. After reviewing the risks                            and benefits, the patient was deemed in  satisfactory condition to undergo the procedure.                           After obtaining informed consent, the colonoscope                            was passed under direct vision. Throughout the                            procedure, the patient's blood pressure, pulse, and                            oxygen saturations were monitored continuously. The                            Colonoscope was introduced through the anus and                             advanced to the the terminal ileum. The colonoscopy                            was performed without difficulty. The patient                            tolerated the procedure well. The quality of the                            bowel preparation was good. The terminal ileum,                            ileocecal valve, appendiceal orifice, and rectum                            were photographed. Scope In: 9:54:52 AM Scope Out: 10:04:56 AM Scope Withdrawal Time: 0 hours 6 minutes 33 seconds  Total Procedure Duration: 0 hours 10 minutes 4 seconds  Findings:                 Two sessile polyps were found in the sigmoid colon                            and ascending colon. The polyps were 2 to 9 mm in                            size. These polyps were removed with a cold snare.                            Resection and retrieval were complete.                           Multiple small and large-mouthed diverticula were                            found in the entire colon.  The terminal ileum appeared normal.                           The exam was otherwise without abnormality on                            direct and retroflexion views. Complications:            No immediate complications. Estimated blood loss:                            None. Estimated Blood Loss:     Estimated blood loss: none. Impression:               - Two 2 to 9 mm polyps in the sigmoid colon and in                            the ascending colon, removed with a cold snare.                            Resected and retrieved.                           - Diverticulosis in the entire examined colon.                           - The examined portion of the ileum was normal.                           - The examination was otherwise normal on direct                            and retroflexion views. Recommendation:           - Patient has a contact number available for                             emergencies. The signs and symptoms of potential                            delayed complications were discussed with the                            patient. Return to normal activities tomorrow.                            Written discharge instructions were provided to the                            patient.                           - Resume previous diet.                           - Continue present medications.                           -  Await pathology results. Milus Banister, MD 09/17/2019 10:09:01 AM This report has been signed electronically.

## 2019-09-17 NOTE — Progress Notes (Signed)
Called to room to assist during endoscopic procedure.  Patient ID and intended procedure confirmed with present staff. Received instructions for my participation in the procedure from the performing physician.  

## 2019-09-17 NOTE — Patient Instructions (Signed)
HANDOUTS PROVIDED ON: POLYPS  The polyps removed today have been sent for pathology.  The results can take 1-3 weeks to receive.  When your next colonoscopy should occur will be based on the pathology results.    You may resume your previous diet and medication schedule.  Thank you for allowing us to care for you today!!!   YOU HAD AN ENDOSCOPIC PROCEDURE TODAY AT THE [facility]:   Refer to the procedure report that was given to you for any specific questions about what was found during the examination.  If the procedure report does not answer your questions, please call your gastroenterologist to clarify.  If you requested that your care partner not be given the details of your procedure findings, then the procedure report has been included in a sealed envelope for you to review at your convenience later.  YOU SHOULD EXPECT: Some feelings of bloating in the abdomen. Passage of more gas than usual.  Walking can help get rid of the air that was put into your GI tract during the procedure and reduce the bloating. If you had a lower endoscopy (such as a colonoscopy or flexible sigmoidoscopy) you may notice spotting of blood in your stool or on the toilet paper. If you underwent a bowel prep for your procedure, you may not have a normal bowel movement for a few days.  Please Note:  You might notice some irritation and congestion in your nose or some drainage.  This is from the oxygen used during your procedure.  There is no need for concern and it should clear up in a day or so.  SYMPTOMS TO REPORT IMMEDIATELY:   Following lower endoscopy (colonoscopy or flexible sigmoidoscopy):  Excessive amounts of blood in the stool  Significant tenderness or worsening of abdominal pains  Swelling of the abdomen that is new, acute  Fever of 100F or higher  For urgent or emergent issues, a gastroenterologist can be reached at any hour by calling [phone]. Do not use MyChart messaging for  urgent concerns.    DIET:  We do recommend a small meal at first, but then you may proceed to your regular diet.  Drink plenty of fluids but you should avoid alcoholic beverages for 24 hours.  ACTIVITY:  You should plan to take it easy for the rest of today and you should NOT DRIVE or use heavy machinery until tomorrow (because of the sedation medicines used during the test).    FOLLOW UP: Our staff will call the number listed on your records 48-72 hours following your procedure to check on you and address any questions or concerns that you may have regarding the information given to you following your procedure. If we do not reach you, we will leave a message.  We will attempt to reach you two times.  During this call, we will ask if you have developed any symptoms of COVID 19. If you develop any symptoms (ie: fever, flu-like symptoms, shortness of breath, cough etc.) before then, please call [phone].  If you test positive for Covid 19 in the 2 weeks post procedure, please call and report this information to us.    If any biopsies were taken you will be contacted by phone or by letter within the next 1-3 weeks.  Please call us at [phone] if you have not heard about the biopsies in 3 weeks.    SIGNATURES/CONFIDENTIALITY: You and/or your care partner have signed paperwork which will be entered into   your electronic medical record.  These signatures attest to the fact that that the information above on your After Visit Summary has been reviewed and is understood.  Full responsibility of the confidentiality of this discharge information lies with you and/or your care-partner.

## 2019-09-19 ENCOUNTER — Telehealth: Payer: Self-pay

## 2019-09-19 NOTE — Telephone Encounter (Signed)
  Follow up Call-  Call back number 09/17/2019  Post procedure Call Back phone  # [phone]  Permission to leave phone message Yes  Some recent data might be hidden     Patient questions:  Do you have a fever, pain , or abdominal swelling? No. Pain Score  0 *  Have you tolerated food without any problems? Yes.    Have you been able to return to your normal activities? Yes.    Do you have any questions about your discharge instructions: Diet   No. Medications  No. Follow up visit  No.  Do you have questions or concerns about your Care? No.  Actions: * If pain score is 4 or above: No action needed, pain <4.  1. Have you developed a fever since your procedure? no  2.   Have you had an respiratory symptoms (SOB or cough) since your procedure? no  3.   Have you tested positive for COVID 19 since your procedure no  4.   Have you had any family members/close contacts diagnosed with the COVID 19 since your procedure?  no   If yes to any of these questions please route to Joylene John, RN and Erenest Rasher, RN

## 2019-09-24 ENCOUNTER — Encounter: Payer: Self-pay | Admitting: Gastroenterology

## 2019-09-24 ENCOUNTER — Other Ambulatory Visit: Payer: Self-pay | Admitting: Internal Medicine

## 2019-09-24 DIAGNOSIS — E785 Hyperlipidemia, unspecified: Secondary | ICD-10-CM

## 2019-10-08 DIAGNOSIS — Z79899 Other long term (current) drug therapy: Secondary | ICD-10-CM | POA: Diagnosis not present

## 2019-10-08 DIAGNOSIS — E559 Vitamin D deficiency, unspecified: Secondary | ICD-10-CM | POA: Diagnosis not present

## 2019-10-08 DIAGNOSIS — M129 Arthropathy, unspecified: Secondary | ICD-10-CM | POA: Diagnosis not present

## 2019-10-19 ENCOUNTER — Other Ambulatory Visit: Payer: Self-pay | Admitting: Internal Medicine

## 2019-10-22 DIAGNOSIS — Z79899 Other long term (current) drug therapy: Secondary | ICD-10-CM | POA: Diagnosis not present

## 2019-11-19 ENCOUNTER — Ambulatory Visit: Payer: Medicaid Other | Admitting: Internal Medicine

## 2019-11-21 ENCOUNTER — Other Ambulatory Visit: Payer: Self-pay | Admitting: Internal Medicine

## 2019-11-21 DIAGNOSIS — Z79899 Other long term (current) drug therapy: Secondary | ICD-10-CM | POA: Diagnosis not present

## 2019-11-21 DIAGNOSIS — I10 Essential (primary) hypertension: Secondary | ICD-10-CM

## 2019-12-05 ENCOUNTER — Other Ambulatory Visit: Payer: Self-pay | Admitting: Internal Medicine

## 2019-12-05 DIAGNOSIS — I10 Essential (primary) hypertension: Secondary | ICD-10-CM

## 2019-12-13 DIAGNOSIS — Z0000 Encounter for general adult medical examination without abnormal findings: Secondary | ICD-10-CM | POA: Diagnosis not present

## 2019-12-13 DIAGNOSIS — Z20822 Contact with and (suspected) exposure to covid-19: Secondary | ICD-10-CM | POA: Diagnosis not present

## 2019-12-13 DIAGNOSIS — R5383 Other fatigue: Secondary | ICD-10-CM | POA: Diagnosis not present

## 2019-12-13 DIAGNOSIS — E119 Type 2 diabetes mellitus without complications: Secondary | ICD-10-CM | POA: Diagnosis not present

## 2019-12-13 DIAGNOSIS — E7800 Pure hypercholesterolemia, unspecified: Secondary | ICD-10-CM | POA: Diagnosis not present

## 2019-12-18 DIAGNOSIS — Z79899 Other long term (current) drug therapy: Secondary | ICD-10-CM | POA: Diagnosis not present

## 2020-01-10 ENCOUNTER — Other Ambulatory Visit: Payer: Self-pay | Admitting: Internal Medicine

## 2020-01-10 DIAGNOSIS — I10 Essential (primary) hypertension: Secondary | ICD-10-CM

## 2020-01-16 DIAGNOSIS — Z79899 Other long term (current) drug therapy: Secondary | ICD-10-CM | POA: Diagnosis not present

## 2020-02-15 DIAGNOSIS — Z79899 Other long term (current) drug therapy: Secondary | ICD-10-CM | POA: Diagnosis not present

## 2020-03-12 DIAGNOSIS — R198 Other specified symptoms and signs involving the digestive system and abdomen: Secondary | ICD-10-CM | POA: Diagnosis not present

## 2020-03-12 DIAGNOSIS — Z9229 Personal history of other drug therapy: Secondary | ICD-10-CM | POA: Diagnosis not present

## 2020-03-12 DIAGNOSIS — Z01818 Encounter for other preprocedural examination: Secondary | ICD-10-CM | POA: Diagnosis not present

## 2020-03-17 DIAGNOSIS — Z79899 Other long term (current) drug therapy: Secondary | ICD-10-CM | POA: Diagnosis not present

## 2020-03-31 NOTE — Progress Notes (Deleted)
Office Visit Note  Patient: Charlene Allen             Date of Birth: [birth year]           MRN: [id number]             PCP: Marcine Matar, MD Referring: Shanna Cisco, NP Visit Date: 04/14/2020 Occupation: @GUAROCC @  Subjective:  No chief complaint on file.   History of Present Illness: Charlene Allen is a 65 y.o. female ***   Activities of Daily Living:  Patient reports morning stiffness for *** {minute/hour:19697}.   Patient {ACTIONS;DENIES/REPORTS:21021675::"Denies"} nocturnal pain.  Difficulty dressing/grooming: {ACTIONS;DENIES/REPORTS:21021675::"Denies"} Difficulty climbing stairs: {ACTIONS;DENIES/REPORTS:21021675::"Denies"} Difficulty getting out of chair: {ACTIONS;DENIES/REPORTS:21021675::"Denies"} Difficulty using hands for taps, buttons, cutlery, and/or writing: {ACTIONS;DENIES/REPORTS:21021675::"Denies"}  No Rheumatology ROS completed.   PMFS History:  Patient Active Problem List   Diagnosis Date Noted  . Hx of fusion of cervical spine 12/12/2018  . Controlled type 2 diabetes mellitus without complication, without long-term current use of insulin (HCC) 11/15/2017  . Lumbar radiculopathy, chronic 11/15/2017  . Increased endometrial stripe thickness 09/08/2016  . Chronic right shoulder pain 07/22/2016  . COPD GOLD 0 still smoking  06/18/2016  . HPV test positive 10/13/2015  . Mild diastolic dysfunction 09/09/2015  . Chronic diarrhea 08/13/2015  . Cigarette smoker 08/13/2015  . Chronic back pain 03/15/2013  . Essential hypertension, benign 03/15/2013  . History of substance abuse (HCC) 03/15/2013    Past Medical History:  Diagnosis Date  . Diabetes mellitus without complication (HCC)   . Hyperlipidemia   . Hypertension Dx 2015  . Obesity   . PMB (postmenopausal bleeding)     Family History  Problem Relation Age of Onset  . COPD Mother   . Stroke Mother   . Heart disease Mother   . Hypertension Mother   . Cancer Sister 25       cervical  cancer   . Stroke Maternal Grandmother   . Colon cancer Brother   . Colon polyps Brother   . Esophageal cancer Neg Hx   . Rectal cancer Neg Hx   . Stomach cancer Neg Hx    Past Surgical History:  Procedure Laterality Date  . ABDOMINAL AORTOGRAM N/A 07/12/2016   Procedure: Abdominal Aortogram;  Surgeon: 07/14/2016, MD;  Location: [facility] CV LAB;  Service: Cardiovascular;  Laterality: N/A;  . ANTERIOR CERVICAL DECOMP/DISCECTOMY FUSION N/A 09/05/2017   Procedure: C4-5, C5-6 ANTERIOR CERVICAL DECOMPRESSION/DISCECTOMY FUSION, ALLOGRAFT, PLATE;  Surgeon: 11/05/2017, MD;  Location: [facility] OR;  Service: Orthopedics;  Laterality: N/A;  . HAND SURGERY    . LEFT HEART CATH AND CORONARY ANGIOGRAPHY N/A 07/12/2016   Procedure: Left Heart Cath and Coronary Angiography;  Surgeon: 07/14/2016, MD;  Location: [facility] INVASIVE CV LAB;  Service: Cardiovascular;  Laterality: N/A;  . TUBAL LIGATION     Social History   Social History Narrative  . Not on file   Immunization History  Administered Date(s) Administered  . Influenza,inj,Quad PF,6+ Mos 03/04/2014, 11/18/2015, 12/14/2016, 11/15/2017, 01/11/2019  . Moderna Sars-Covid-2 Vaccination 06/22/2019, 07/20/2019  . Pneumococcal Polysaccharide-23 04/25/2018  . Tdap 09/02/2014     Objective: Vital Signs: There were no vitals taken for this visit.   Physical Exam   Musculoskeletal Exam: ***  CDAI Exam: CDAI Score: -- Patient Global: --; Provider Global: -- Swollen: --; Tender: -- Joint Exam 04/14/2020   No joint exam has been documented for this visit   There is currently no information documented on  the homunculus. Go to the Rheumatology activity and complete the homunculus joint exam.  Investigation: No additional findings.  Imaging: No results found.  Recent Labs: Lab Results  Component Value Date   WBC 9.9 01/11/2019   HGB 14.4 01/11/2019   PLT 404 01/11/2019   NA 142 01/11/2019   K 3.7 01/11/2019   CL 101  01/11/2019   CO2 24 01/11/2019   GLUCOSE 86 01/11/2019   BUN 14 01/11/2019   CREATININE 0.70 01/11/2019   BILITOT <0.2 01/11/2019   ALKPHOS 82 01/11/2019   AST 15 01/11/2019   ALT 12 01/11/2019   PROT 7.5 01/11/2019   ALBUMIN 5.3 (H) 01/11/2019   CALCIUM 10.3 01/11/2019   GFRAA 108 01/11/2019    Speciality Comments: No specialty comments available.  Procedures:  No procedures performed Allergies: Cymbalta [duloxetine hcl] and Lisinopril   Assessment / Plan:     Visit Diagnoses: Anti-RNP antibodies present - 10/08/19: CCP 6, RF<17, ANA positive, dsDNA 1, RNP 4.5, Smith antibodies-, Ro-, La-, uric acid 5.7  Lumbar radiculopathy, chronic  Hx of fusion of cervical spine  Chronic right shoulder pain  Essential hypertension, benign  COPD GOLD 0 still smoking   Controlled type 2 diabetes mellitus without complication, without long-term current use of insulin (HCC)  Mild diastolic dysfunction  History of substance abuse ([place])  Cigarette smoker  Orders: No orders of the defined types were placed in this encounter.  No orders of the defined types were placed in this encounter.   Face-to-face time spent with patient was *** minutes. Greater than 50% of time was spent in counseling and coordination of care.  Follow-Up Instructions: No follow-ups on file.   Ofilia Neas, PA-C  Note - This record has been created using Dragon software.  Chart creation errors have been sought, but may not always  have been located. Such creation errors do not reflect on  the standard of medical care.

## 2020-04-14 ENCOUNTER — Ambulatory Visit: Payer: Medicaid Other | Admitting: Rheumatology

## 2020-04-14 DIAGNOSIS — R768 Other specified abnormal immunological findings in serum: Secondary | ICD-10-CM

## 2020-04-14 DIAGNOSIS — I519 Heart disease, unspecified: Secondary | ICD-10-CM

## 2020-04-14 DIAGNOSIS — E119 Type 2 diabetes mellitus without complications: Secondary | ICD-10-CM

## 2020-04-14 DIAGNOSIS — F1911 Other psychoactive substance abuse, in remission: Secondary | ICD-10-CM

## 2020-04-14 DIAGNOSIS — Z981 Arthrodesis status: Secondary | ICD-10-CM

## 2020-04-14 DIAGNOSIS — J449 Chronic obstructive pulmonary disease, unspecified: Secondary | ICD-10-CM

## 2020-04-14 DIAGNOSIS — I10 Essential (primary) hypertension: Secondary | ICD-10-CM

## 2020-04-14 DIAGNOSIS — F17210 Nicotine dependence, cigarettes, uncomplicated: Secondary | ICD-10-CM

## 2020-04-14 DIAGNOSIS — G8929 Other chronic pain: Secondary | ICD-10-CM

## 2020-04-14 DIAGNOSIS — M5416 Radiculopathy, lumbar region: Secondary | ICD-10-CM

## 2020-04-17 DIAGNOSIS — Z79899 Other long term (current) drug therapy: Secondary | ICD-10-CM | POA: Diagnosis not present

## 2020-05-06 ENCOUNTER — Ambulatory Visit: Payer: Medicaid Other | Admitting: Rheumatology

## 2020-05-15 DIAGNOSIS — E559 Vitamin D deficiency, unspecified: Secondary | ICD-10-CM | POA: Diagnosis not present

## 2020-05-15 DIAGNOSIS — E7800 Pure hypercholesterolemia, unspecified: Secondary | ICD-10-CM | POA: Diagnosis not present

## 2020-05-15 DIAGNOSIS — Z79899 Other long term (current) drug therapy: Secondary | ICD-10-CM | POA: Diagnosis not present

## 2020-05-15 DIAGNOSIS — E119 Type 2 diabetes mellitus without complications: Secondary | ICD-10-CM | POA: Diagnosis not present

## 2020-05-15 DIAGNOSIS — M129 Arthropathy, unspecified: Secondary | ICD-10-CM | POA: Diagnosis not present

## 2020-06-12 DIAGNOSIS — Z79899 Other long term (current) drug therapy: Secondary | ICD-10-CM | POA: Diagnosis not present

## 2020-07-15 DIAGNOSIS — Z79899 Other long term (current) drug therapy: Secondary | ICD-10-CM | POA: Diagnosis not present

## 2020-07-23 NOTE — Progress Notes (Signed)
Office Visit Note  Patient: Charlene Allen             Date of Birth: Oct 18, 1955           MRN: [id number]             PCP: Riki Sheer, NP Referring: Riki Sheer, NP Visit Date: 08/05/2020 Occupation: _0 @  Subjective:  Pain in joints.   History of Present Illness: Charlene Allen is a 65 y.o. female in consultation per request of her PCP.  According to the patient she has had neck pain for over 35 years.  She states she was seen by Dr. Inda Merlin in 2019 at the time she underwent C-spine fusion and the discomfort resolved.  She also has history of lower back pain for over 8 years.  She has been diagnosed with degenerative disc disease.  She continues to have lower back pain and radiculopathy.  Over the last 2 years she has noticed discomfort in her bilateral shoulders more so on the right than the left side.  She also describes discomfort in her bilateral thumb.  None of the other joints are painful.  She denies any history of joint swelling.  There is no history of oral ulcers, nasal ulcers, sicca symptoms, malar rash, Raynaud's phenomenon, photosensitivity, lymphadenopathy.  She also gives history of muscle spasms.  There is no family history of autoimmune disease.  She is gravida 6, para 3, miscarriages 2, abortion 1.  Activities of Daily Living:  Patient reports morning stiffness for 2-3 hours.   Patient Reports nocturnal pain.  Difficulty dressing/grooming: Denies Difficulty climbing stairs: Denies Difficulty getting out of chair: Denies Difficulty using hands for taps, buttons, cutlery, and/or writing: Denies  Review of Systems  Constitutional: Negative for fatigue, night sweats, weight gain and weight loss.  HENT: Negative for mouth sores, trouble swallowing, trouble swallowing, mouth dryness and nose dryness.   Eyes: Negative for pain, redness, itching, visual disturbance and dryness.  Respiratory: Positive for shortness of breath. Negative for cough, wheezing  and difficulty breathing.        COPD  Cardiovascular: Negative for chest pain, palpitations, hypertension, irregular heartbeat and swelling in legs/feet.  Gastrointestinal: Negative for blood in stool, constipation and diarrhea.  Endocrine: Negative for increased urination.  Genitourinary: Negative for difficulty urinating and vaginal dryness.  Musculoskeletal: Positive for arthralgias, joint pain, myalgias, morning stiffness, muscle tenderness and myalgias. Negative for joint swelling and muscle weakness.  Skin: Negative for color change, rash, hair loss, redness, skin tightness, ulcers and sensitivity to sunlight.  Allergic/Immunologic: Negative for susceptible to infections.  Neurological: Negative for dizziness, numbness, headaches, memory loss, night sweats and weakness.  Hematological: Negative for bruising/bleeding tendency and swollen glands.  Psychiatric/Behavioral: Positive for depressed mood and sleep disturbance. Negative for confusion. The patient is not nervous/anxious.     PMFS History:  Patient Active Problem List   Diagnosis Date Noted  . Hx of fusion of cervical spine 12/12/2018  . Controlled type 2 diabetes mellitus without complication, without long-term current use of insulin ([place]) 11/15/2017  . Lumbar radiculopathy, chronic 11/15/2017  . Increased endometrial stripe thickness 09/08/2016  . Chronic right shoulder pain 07/22/2016  . COPD GOLD 0 still smoking  06/18/2016  . HPV test positive 10/13/2015  . Mild diastolic dysfunction 16/60/6301  . Chronic diarrhea 08/13/2015  . Cigarette smoker 08/13/2015  . Chronic back pain 03/15/2013  . Essential hypertension, benign 03/15/2013  . History of substance abuse ([place]) 03/15/2013    Past  Medical History:  Diagnosis Date  . Diabetes mellitus without complication ([place])   . Hyperlipidemia   . Hypertension Dx 2015  . Obesity   . PMB (postmenopausal bleeding)     Family History  Problem Relation Age of Onset  . COPD  Mother   . Stroke Mother   . Heart disease Mother   . Hypertension Mother   . Cancer Sister 25       cervical cancer   . Stroke Maternal Grandmother   . Colon cancer Brother   . Healthy Daughter   . Healthy Daughter   . Healthy Daughter   . Esophageal cancer Neg Hx   . Rectal cancer Neg Hx   . Stomach cancer Neg Hx    Past Surgical History:  Procedure Laterality Date  . ABDOMINAL AORTOGRAM N/A 07/12/2016   Procedure: Abdominal Aortogram;  Surgeon: Jettie Booze, MD;  Location: [facility] CV LAB;  Service: Cardiovascular;  Laterality: N/A;  . ANTERIOR CERVICAL DECOMP/DISCECTOMY FUSION N/A 09/05/2017   Procedure: C4-5, C5-6 ANTERIOR CERVICAL DECOMPRESSION/DISCECTOMY FUSION, ALLOGRAFT, PLATE;  Surgeon: Marybelle Killings, MD;  Location: [facility];  Service: Orthopedics;  Laterality: N/A;  . FOOT SURGERY Left   . HAND SURGERY Bilateral   . LEFT HEART CATH AND CORONARY ANGIOGRAPHY N/A 07/12/2016   Procedure: Left Heart Cath and Coronary Angiography;  Surgeon: Jettie Booze, MD;  Location: [facility];  Service: Cardiovascular;  Laterality: N/A;  . TUBAL LIGATION     Social History   Social History Narrative  . Not on file   Immunization History  Administered Date(s) Administered  . Influenza,inj,Quad PF,6+ Mos 03/04/2014, 11/18/2015, 12/14/2016, 11/15/2017, 01/11/2019  . Moderna Sars-Covid-2 Vaccination 06/22/2019, 07/20/2019  . Pneumococcal Polysaccharide-23 04/25/2018  . Tdap 09/02/2014     Objective: Vital Signs: BP (!) 155/83 (BP Location: Right Arm, Patient Position: Sitting, Cuff Size: Normal)   Pulse (!) 58   Ht 5' 3.5" (1.613 m)   Wt 165 lb 6.4 oz (75 kg)   BMI 28.84 kg/m    Physical Exam Vitals and nursing note reviewed.  Constitutional:      Appearance: She is well-developed.  HENT:     Head: Normocephalic and atraumatic.  Eyes:     Conjunctiva/sclera: Conjunctivae normal.  Cardiovascular:     Rate and Rhythm: Normal rate and regular rhythm.      Heart sounds: Normal heart sounds.  Pulmonary:     Effort: Pulmonary effort is normal.     Breath sounds: Normal breath sounds.  Abdominal:     General: Bowel sounds are normal.     Palpations: Abdomen is soft.  Musculoskeletal:     Cervical back: Normal range of motion.  Lymphadenopathy:     Cervical: No cervical adenopathy.  Skin:    General: Skin is warm and dry.     Capillary Refill: Capillary refill takes less than 2 seconds.  Neurological:     Mental Status: She is alert and oriented to person, place, and time.  Psychiatric:        Behavior: Behavior normal.      Musculoskeletal Exam: She has limited range of motion of cervical spine without discomfort.  She has limited range of motion of her lumbar spine with discomfort.  She had good range of motion of bilateral shoulder joints with some discomfort in her right shoulder joint.  Elbow joints and wrist joints with good range of motion.  She had bilateral CMC PIP and DIP thickening without synovitis.  Hip joints with good range of motion.  She had tenderness on palpation over left trochanteric bursa.  There was no warmth swelling or effusion of her knee joints.  Ankle joints, MTPs and PIPs with good range of motion with no synovitis.  CDAI Exam: CDAI Score: -- Patient Global: --; Provider Global: -- Swollen: --; Tender: -- Joint Exam 08/05/2020   No joint exam has been documented for this visit   There is currently no information documented on the homunculus. Go to the Rheumatology activity and complete the homunculus joint exam.  Investigation: No additional findings.  Imaging: XR Hand 2 View Left  Result Date: 08/05/2020 CMC, PIP and DIP narrowing was noted.  No MCP, intercarpal or radiocarpal joint space narrowing was noted.  No erosive changes were noted. Impression: These findings are consistent with osteoarthritis of the hand.  XR Hand 2 View Right  Result Date: 08/05/2020 CMC, PIP and DIP narrowing was noted.   No MCP, intercarpal or radiocarpal joint space narrowing was noted.  No erosive changes were noted. Impression: These findings are consistent with osteoarthritis of the hand.  XR Shoulder Right  Result Date: 08/05/2020 No glenohumeral joint space narrowing was noted.  No chondrocalcinosis was noted.  Acromioclavicular joint space narrowing and spurring was noted. Impression: These findings are consistent with acromioclavicular arthritis.   Recent Labs: Lab Results  Component Value Date   WBC 9.9 01/11/2019   HGB 14.4 01/11/2019   PLT 404 01/11/2019   NA 142 01/11/2019   K 3.7 01/11/2019   CL 101 01/11/2019   CO2 24 01/11/2019   GLUCOSE 86 01/11/2019   BUN 14 01/11/2019   CREATININE 0.70 01/11/2019   BILITOT <0.2 01/11/2019   ALKPHOS 82 01/11/2019   AST 15 01/11/2019   ALT 12 01/11/2019   PROT 7.5 01/11/2019   ALBUMIN 5.3 (H) 01/11/2019   CALCIUM 10.3 01/11/2019   GFRAA 108 01/11/2019    Speciality Comments: No specialty comments available.  Procedures:  No procedures performed Allergies: Cymbalta [duloxetine hcl] and Lisinopril   Assessment / Plan:     Visit Diagnoses: Anti-RNP antibodies present - 05/15/20: ANA+, ESR 10, dsDNA<1, RNP 3.4, Smith ab-, Ro-, La-, RF <10, PTH 54, Anti-CCP 7, Vitamin D 43.82, CRP 1.  She has positive ANA with no titer and positive RNP.  She has no clinical features of autoimmune disease.  I will obtain [facility] labs.  Chronic right shoulder pain-she complains of pain and discomfort in her right shoulder joint for at least 2 years.  X-ray of the right shoulder joint shows acromioclavicular arthritis.  Pain in both hands-she has bilateral PIP DIP and CMC prominence with no synovitis.  Clinical findings are consistent with osteoarthritis.  She complains of pain and discomfort in her bilateral thumb.  X-ray of bilateral hands obtained today were consistent with osteoarthritis.  Trochanteric bursitis of the left hip-she had tenderness on palpation of her  left trochanteric bursa.  She states it wakes her up in the middle of the night if she turns to the left side.  Hx of fusion of cervical spine - by Dr. [name] 08/2017.  She had good results from the surgery.  Chronic bilateral low back pain with bilateral sciatica-she continues to have severe lower back pain and muscle spasms in her back.  Chronic pain syndrome-she is on hydrocodone for pain management.  Essential hypertension, benign-blood pressure is elevated.  She is on antihypertensives.  Mild diastolic dysfunction  Hypercholesterolemia-she is on Lipitor.  Controlled type 2  diabetes mellitus without complication, without long-term current use of insulin (HCC)-she is on Glucophage.  History of IBS  COPD GOLD 0 still smoking   Cigarette smoker - Half a pack per day x 50 years.  With many years of smoking I would strongly recommend low radiation CT chest.  Have advised her to discuss that further with her PCP.  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  Vitamin D deficiency  Orders: Orders Placed This Encounter  Procedures  . XR Shoulder Right  . XR Hand 2 View Right  . XR Hand 2 View Left   No orders of the defined types were placed in this encounter.    Follow-Up Instructions: Return for Positive RNP.   Bo Merino, MD  Note - This record has been created using Editor, commissioning.  Chart creation errors have been sought, but may not always  have been located. Such creation errors do not reflect on  the standard of medical care.

## 2020-08-05 ENCOUNTER — Ambulatory Visit: Payer: Self-pay

## 2020-08-05 ENCOUNTER — Ambulatory Visit: Payer: Medicaid Other | Admitting: Rheumatology

## 2020-08-05 ENCOUNTER — Encounter: Payer: Self-pay | Admitting: Rheumatology

## 2020-08-05 ENCOUNTER — Other Ambulatory Visit: Payer: Self-pay

## 2020-08-05 VITALS — BP 155/83 | HR 58 | Ht 63.5 in | Wt 165.4 lb

## 2020-08-05 DIAGNOSIS — E119 Type 2 diabetes mellitus without complications: Secondary | ICD-10-CM | POA: Diagnosis not present

## 2020-08-05 DIAGNOSIS — I10 Essential (primary) hypertension: Secondary | ICD-10-CM

## 2020-08-05 DIAGNOSIS — M79642 Pain in left hand: Secondary | ICD-10-CM

## 2020-08-05 DIAGNOSIS — R768 Other specified abnormal immunological findings in serum: Secondary | ICD-10-CM | POA: Diagnosis not present

## 2020-08-05 DIAGNOSIS — E7800 Pure hypercholesterolemia, unspecified: Secondary | ICD-10-CM

## 2020-08-05 DIAGNOSIS — G8929 Other chronic pain: Secondary | ICD-10-CM

## 2020-08-05 DIAGNOSIS — F1911 Other psychoactive substance abuse, in remission: Secondary | ICD-10-CM

## 2020-08-05 DIAGNOSIS — M5441 Lumbago with sciatica, right side: Secondary | ICD-10-CM

## 2020-08-05 DIAGNOSIS — Z981 Arthrodesis status: Secondary | ICD-10-CM

## 2020-08-05 DIAGNOSIS — M79641 Pain in right hand: Secondary | ICD-10-CM

## 2020-08-05 DIAGNOSIS — M25511 Pain in right shoulder: Secondary | ICD-10-CM | POA: Diagnosis not present

## 2020-08-05 DIAGNOSIS — Z8719 Personal history of other diseases of the digestive system: Secondary | ICD-10-CM

## 2020-08-05 DIAGNOSIS — I519 Heart disease, unspecified: Secondary | ICD-10-CM | POA: Diagnosis not present

## 2020-08-05 DIAGNOSIS — F17210 Nicotine dependence, cigarettes, uncomplicated: Secondary | ICD-10-CM

## 2020-08-05 DIAGNOSIS — M5442 Lumbago with sciatica, left side: Secondary | ICD-10-CM

## 2020-08-05 DIAGNOSIS — J449 Chronic obstructive pulmonary disease, unspecified: Secondary | ICD-10-CM

## 2020-08-05 DIAGNOSIS — G894 Chronic pain syndrome: Secondary | ICD-10-CM | POA: Diagnosis not present

## 2020-08-05 DIAGNOSIS — M7062 Trochanteric bursitis, left hip: Secondary | ICD-10-CM | POA: Diagnosis not present

## 2020-08-05 DIAGNOSIS — E559 Vitamin D deficiency, unspecified: Secondary | ICD-10-CM

## 2020-08-05 DIAGNOSIS — M5416 Radiculopathy, lumbar region: Secondary | ICD-10-CM

## 2020-08-05 NOTE — Patient Instructions (Signed)
Iliotibial Band Syndrome Rehab Ask your health care provider which exercises are safe for you. Do exercises exactly as told by your health care provider and adjust them as directed. It is normal to feel mild stretching, pulling, tightness, or discomfort as you do these exercises. Stop right away if you feel sudden pain or your pain gets significantly worse. Do not begin these exercises until told by your health care provider. Stretching and range-of-motion exercises These exercises warm up your muscles and joints and improve the movement and flexibility of your hip and pelvis. Quadriceps stretch, prone 1. Lie on your abdomen (prone position) on a firm surface, such as a bed or padded floor. 2. Bend your left / right knee and reach back to hold your ankle or pant leg. If you cannot reach your ankle or pant leg, loop a belt around your foot and grab the belt instead. 3. Gently pull your heel toward your buttocks. Your knee should not slide out to the side. You should feel a stretch in the front of your thigh and knee (quadriceps). 4. Hold this position for __________ seconds. Repeat __________ times. Complete this exercise __________ times a day.   Iliotibial band stretch An iliotibial band is a strong band of muscle tissue that runs from the outer side of your hip to the outer side of your thigh and knee. 1. Lie on your side with your left / right leg in the top position. 2. Bend both of your knees and grab your left / right ankle. Stretch out your bottom arm to help you balance. 3. Slowly bring your top knee back so your thigh goes behind your trunk. 4. Slowly lower your top leg toward the floor until you feel a gentle stretch on the outside of your left / right hip and thigh. If you do not feel a stretch and your knee will not fall farther, place the heel of your other foot on top of your knee and pull your knee down toward the floor with your foot. 5. Hold this position for __________  seconds. Repeat __________ times. Complete this exercise __________ times a day.   Strengthening exercises These exercises build strength and endurance in your hip and pelvis. Endurance is the ability to use your muscles for a long time, even after they get tired. Straight leg raises, side-lying This exercise strengthens the muscles that rotate the leg at the hip and move it away from your body (hip abductors). 1. Lie on your side with your left / right leg in the top position. Lie so your head, shoulder, hip, and knee line up. You may bend your bottom knee to help you balance. 2. Roll your hips slightly forward so your hips are stacked directly over each other and your left / right knee is facing forward. 3. Tense the muscles in your outer thigh and lift your top leg 4-6 inches (10-15 cm). 4. Hold this position for __________ seconds. 5. Slowly lower your leg to return to the starting position. Let your muscles relax completely before doing another repetition. Repeat __________ times. Complete this exercise __________ times a day.   Leg raises, prone This exercise strengthens the muscles that move the hips backward (hip extensors). 1. Lie on your abdomen (prone position) on your bed or a firm surface. You can put a pillow under your hips if that is more comfortable for your lower back. 2. Bend your left / right knee so your foot is straight up in the air.   3. Squeeze your buttocks muscles and lift your left / right thigh off the bed. Do not let your back arch. 4. Tense your thigh muscle as hard as you can without increasing any knee pain. 5. Hold this position for __________ seconds. 6. Slowly lower your leg to return to the starting position and allow it to relax completely. Repeat __________ times. Complete this exercise __________ times a day. Hip hike 1. Stand sideways on a bottom step. Stand on your left / right leg with your other foot unsupported next to the step. You can hold on to a  railing or wall for balance if needed. 2. Keep your knees straight and your torso square. Then lift your left / right hip up toward the ceiling. 3. Slowly let your left / right hip lower toward the floor, past the starting position. Your foot should get closer to the floor. Do not lean or bend your knees. Repeat __________ times. Complete this exercise __________ times a day. This information is not intended to replace advice given to you by your health care provider. Make sure you discuss any questions you have with your health care provider. Document Revised: 05/23/2019 Document Reviewed: 05/23/2019 [employer] Patient Education  2021 [employer] Shoulder Exercises Ask your health care provider which exercises are safe for you. Do exercises exactly as told by your health care provider and adjust them as directed. It is normal to feel mild stretching, pulling, tightness, or discomfort as you do these exercises. Stop right away if you feel sudden pain or your pain gets worse. Do not begin these exercises until told by your health care provider. Stretching exercises External rotation and abduction This exercise is sometimes called corner stretch. This exercise rotates your arm outward (external rotation) and moves your arm out from your body (abduction). 5. Stand in a doorway with one of your feet slightly in front of the other. This is called a staggered stance. If you cannot reach your forearms to the door frame, stand facing a corner of a room. 6. Choose one of the following positions as told by your health care provider: ? Place your hands and forearms on the door frame above your head. ? Place your hands and forearms on the door frame at the height of your head. ? Place your hands on the door frame at the height of your elbows. 7. Slowly move your weight onto your front foot until you feel a stretch across your chest and in the front of your shoulders. Keep your head and chest upright and keep  your abdominal muscles tight. 8. Hold for __________ seconds. 9. To release the stretch, shift your weight to your back foot. Repeat __________ times. Complete this exercise __________ times a day.   Extension, standing 6. Stand and hold a broomstick, a cane, or a similar object behind your back. ? Your hands should be a little wider than shoulder width apart. ? Your palms should face away from your back. 7. Keeping your elbows straight and your shoulder muscles relaxed, move the stick away from your body until you feel a stretch in your shoulders (extension). ? Avoid shrugging your shoulders while you move the stick. Keep your shoulder blades tucked down toward the middle of your back. 8. Hold for __________ seconds. 9. Slowly return to the starting position. Repeat __________ times. Complete this exercise __________ times a day. Range-of-motion exercises Pendulum 6. Stand near a wall or a surface that you can hold onto for balance. 7.   Bend at the waist and let your left / right arm hang straight down. Use your other arm to support you. Keep your back straight and do not lock your knees. 8. Relax your left / right arm and shoulder muscles, and move your hips and your trunk so your left / right arm swings freely. Your arm should swing because of the motion of your body, not because you are using your arm or shoulder muscles. 9. Keep moving your hips and trunk so your arm swings in the following directions, as told by your health care provider: ? Side to side. ? Forward and backward. ? In clockwise and counterclockwise circles. 10. Continue each motion for __________ seconds, or for as long as told by your health care provider. 11. Slowly return to the starting position. Repeat __________ times. Complete this exercise __________ times a day.   Shoulder flexion, standing 7. Stand and hold a broomstick, a cane, or a similar object. Place your hands a little more than shoulder width apart on  the object. Your left / right hand should be palm up, and your other hand should be palm down. 8. Keep your elbow straight and your shoulder muscles relaxed. Push the stick up with your healthy arm to raise your left / right arm in front of your body, and then over your head until you feel a stretch in your shoulder (flexion). ? Avoid shrugging your shoulder while you raise your arm. Keep your shoulder blade tucked down toward the middle of your back. 9. Hold for __________ seconds. 10. Slowly return to the starting position. Repeat __________ times. Complete this exercise __________ times a day.   Shoulder abduction, standing 4. Stand and hold a broomstick, a cane, or a similar object. Place your hands a little more than shoulder width apart on the object. Your left / right hand should be palm up, and your other hand should be palm down. 5. Keep your elbow straight and your shoulder muscles relaxed. Push the object across your body toward your left / right side. Raise your left / right arm to the side of your body (abduction) until you feel a stretch in your shoulder. ? Do not raise your arm above shoulder height unless your health care provider tells you to do that. ? If directed, raise your arm over your head. ? Avoid shrugging your shoulder while you raise your arm. Keep your shoulder blade tucked down toward the middle of your back. 6. Hold for __________ seconds. 7. Slowly return to the starting position. Repeat __________ times. Complete this exercise __________ times a day. Internal rotation 1. Place your left / right hand behind your back, palm up. 2. Use your other hand to dangle an exercise band, a towel, or a similar object over your shoulder. Grasp the band with your left / right hand so you are holding on to both ends. 3. Gently pull up on the band until you feel a stretch in the front of your left / right shoulder. The movement of your arm toward the center of your body is called  internal rotation. ? Avoid shrugging your shoulder while you raise your arm. Keep your shoulder blade tucked down toward the middle of your back. 4. Hold for __________ seconds. 5. Release the stretch by letting go of the band and lowering your hands. Repeat __________ times. Complete this exercise __________ times a day.   Strengthening exercises External rotation 1. Sit in a stable chair without armrests. 2. Secure   an exercise band to a stable object at elbow height on your left / right side. 3. Place a soft object, such as a folded towel or a small pillow, between your left / right upper arm and your body to move your elbow about 4 inches (10 cm) away from your side. 4. Hold the end of the exercise band so it is tight and there is no slack. 5. Keeping your elbow pressed against the soft object, slowly move your forearm out, away from your abdomen (external rotation). Keep your body steady so only your forearm moves. 6. Hold for __________ seconds. 7. Slowly return to the starting position. Repeat __________ times. Complete this exercise __________ times a day.   Shoulder abduction 1. Sit in a stable chair without armrests, or stand up. 2. Hold a __________ weight in your left / right hand, or hold an exercise band with both hands. 3. Start with your arms straight down and your left / right palm facing in, toward your body. 4. Slowly lift your left / right hand out to your side (abduction). Do not lift your hand above shoulder height unless your health care provider tells you that this is safe. ? Keep your arms straight. ? Avoid shrugging your shoulder while you do this movement. Keep your shoulder blade tucked down toward the middle of your back. 5. Hold for __________ seconds. 6. Slowly lower your arm, and return to the starting position. Repeat __________ times. Complete this exercise __________ times a day.   Shoulder extension 1. Sit in a stable chair without armrests, or stand  up. 2. Secure an exercise band to a stable object in front of you so it is at shoulder height. 3. Hold one end of the exercise band in each hand. Your palms should face each other. 4. Straighten your elbows and lift your hands up to shoulder height. 5. Step back, away from the secured end of the exercise band, until the band is tight and there is no slack. 6. Squeeze your shoulder blades together as you pull your hands down to the sides of your thighs (extension). Stop when your hands are straight down by your sides. Do not let your hands go behind your body. 7. Hold for __________ seconds. 8. Slowly return to the starting position. Repeat __________ times. Complete this exercise __________ times a day. Shoulder row 1. Sit in a stable chair without armrests, or stand up. 2. Secure an exercise band to a stable object in front of you so it is at waist height. 3. Hold one end of the exercise band in each hand. Position your palms so that your thumbs are facing the ceiling (neutral position). 4. Bend each of your elbows to a 90-degree angle (right angle) and keep your upper arms at your sides. 5. Step back until the band is tight and there is no slack. 6. Slowly pull your elbows back behind you. 7. Hold for __________ seconds. 8. Slowly return to the starting position. Repeat __________ times. Complete this exercise __________ times a day. Shoulder press-ups 1. Sit in a stable chair that has armrests. Sit upright, with your feet flat on the floor. 2. Put your hands on the armrests so your elbows are bent and your fingers are pointing forward. Your hands should be about even with the sides of your body. 3. Push down on the armrests and use your arms to lift yourself off the chair. Straighten your elbows and lift yourself up as much as you   comfortably can. ? Move your shoulder blades down, and avoid letting your shoulders move up toward your ears. ? Keep your feet on the ground. As you get  stronger, your feet should support less of your body weight as you lift yourself up. 4. Hold for __________ seconds. 5. Slowly lower yourself back into the chair. Repeat __________ times. Complete this exercise __________ times a day.   Wall push-ups 1. Stand so you are facing a stable wall. Your feet should be about one arm-length away from the wall. 2. Lean forward and place your palms on the wall at shoulder height. 3. Keep your feet flat on the floor as you bend your elbows and lean forward toward the wall. 4. Hold for __________ seconds. 5. Straighten your elbows to push yourself back to the starting position. Repeat __________ times. Complete this exercise __________ times a day.   This information is not intended to replace advice given to you by your health care provider. Make sure you discuss any questions you have with your health care provider. Document Revised: 07/07/2018 Document Reviewed: 04/14/2018 [employer] Patient Education  2021 [employer]  

## 2020-08-13 DIAGNOSIS — Z79899 Other long term (current) drug therapy: Secondary | ICD-10-CM | POA: Diagnosis not present

## 2020-08-13 DIAGNOSIS — M5416 Radiculopathy, lumbar region: Secondary | ICD-10-CM | POA: Diagnosis not present

## 2020-08-18 ENCOUNTER — Encounter: Payer: Self-pay | Admitting: Rheumatology

## 2020-08-21 NOTE — Progress Notes (Signed)
Office Visit Note  Patient: Charlene Allen             Date of Birth: 1955/11/21           MRN: [id number]             PCP: Riki Sheer, NP Referring: Ladell Pier, MD Visit Date: 08/29/2020 Occupation: '@GUAROCC' @  Subjective:  Pain in multiple joints.   History of Present Illness: Charlene Allen is a 66 y.o. female with a history of positive ANA and joint pain.  She states she continues to have pain and discomfort in her bilateral hands especially her left CMC joint.  She has difficulty holding objects and has decreased grip strength.  She has some discomfort in the left trochanteric bursa off and on.  She has been doing some of the stretching exercises.  Her neck and lower back pain persists.  Activities of Daily Living:  Patient reports morning stiffness for 2-3 hours.   Patient Denies nocturnal pain.  Difficulty dressing/grooming: Denies Difficulty climbing stairs: Denies Difficulty getting out of chair: Denies Difficulty using hands for taps, buttons, cutlery, and/or writing: Reports  Review of Systems  Constitutional: Positive for fatigue.  HENT: Negative for mouth sores, mouth dryness and nose dryness.   Eyes: Negative for pain, itching and dryness.  Respiratory: Negative for shortness of breath and difficulty breathing.   Cardiovascular: Negative for chest pain and palpitations.  Gastrointestinal: Negative for blood in stool, constipation and diarrhea.  Endocrine: Negative for increased urination.  Genitourinary: Negative for difficulty urinating.  Musculoskeletal: Positive for arthralgias, joint pain, myalgias, morning stiffness, muscle tenderness and myalgias. Negative for joint swelling.  Skin: Negative for color change, rash and redness.  Allergic/Immunologic: Negative for susceptible to infections.  Neurological: Negative for dizziness, numbness, headaches, memory loss and weakness.  Hematological: Negative for bruising/bleeding tendency.   Psychiatric/Behavioral: Negative for confusion.    PMFS History:  Patient Active Problem List   Diagnosis Date Noted  . Hx of fusion of cervical spine 12/12/2018  . Controlled type 2 diabetes mellitus without complication, without long-term current use of insulin ([place]) 11/15/2017  . Lumbar radiculopathy, chronic 11/15/2017  . Increased endometrial stripe thickness 09/08/2016  . Chronic right shoulder pain 07/22/2016  . COPD GOLD 0 still smoking  06/18/2016  . HPV test positive 10/13/2015  . Mild diastolic dysfunction 95/62/1308  . Chronic diarrhea 08/13/2015  . Cigarette smoker 08/13/2015  . Chronic back pain 03/15/2013  . Essential hypertension, benign 03/15/2013  . History of substance abuse ([place]) 03/15/2013    Past Medical History:  Diagnosis Date  . Diabetes mellitus without complication ([place])   . Hyperlipidemia   . Hypertension Dx 2015  . Obesity   . PMB (postmenopausal bleeding)     Family History  Problem Relation Age of Onset  . COPD Mother   . Stroke Mother   . Heart disease Mother   . Hypertension Mother   . Cancer Sister 25       cervical cancer   . Stroke Maternal Grandmother   . Colon cancer Brother   . Healthy Daughter   . Healthy Daughter   . Healthy Daughter   . Esophageal cancer Neg Hx   . Rectal cancer Neg Hx   . Stomach cancer Neg Hx    Past Surgical History:  Procedure Laterality Date  . ABDOMINAL AORTOGRAM N/A 07/12/2016   Procedure: Abdominal Aortogram;  Surgeon: Jettie Booze, MD;  Location: [facility];  Service: Cardiovascular;  Laterality: N/A;  . ANTERIOR CERVICAL DECOMP/DISCECTOMY FUSION N/A 09/05/2017   Procedure: C4-5, C5-6 ANTERIOR CERVICAL DECOMPRESSION/DISCECTOMY FUSION, ALLOGRAFT, PLATE;  Surgeon: Marybelle Killings, MD;  Location: [facility];  Service: Orthopedics;  Laterality: N/A;  . FOOT SURGERY Left   . HAND SURGERY Bilateral   . LEFT HEART CATH AND CORONARY ANGIOGRAPHY N/A 07/12/2016   Procedure: Left Heart Cath and  Coronary Angiography;  Surgeon: Jettie Booze, MD;  Location: [facility];  Service: Cardiovascular;  Laterality: N/A;  . TUBAL LIGATION     Social History   Social History Narrative  . Not on file   Immunization History  Administered Date(s) Administered  . Influenza,inj,Quad PF,6+ Mos 03/04/2014, 11/18/2015, 12/14/2016, 11/15/2017, 01/11/2019  . Moderna Sars-Covid-2 Vaccination 06/22/2019, 07/20/2019  . Pneumococcal Polysaccharide-23 04/25/2018  . Tdap 09/02/2014     Objective: Vital Signs: BP 133/82 (BP Location: Left Arm, Patient Position: Sitting, Cuff Size: Normal)   Pulse (!) 59   Ht '5\' 3"'  (1.6 m)   Wt 162 lb 12.8 oz (73.8 kg)   BMI 28.84 kg/m    Physical Exam Vitals and nursing note reviewed.  Constitutional:      Appearance: She is well-developed.  HENT:     Head: Normocephalic and atraumatic.  Eyes:     Conjunctiva/sclera: Conjunctivae normal.  Cardiovascular:     Rate and Rhythm: Normal rate and regular rhythm.     Heart sounds: Normal heart sounds.  Pulmonary:     Effort: Pulmonary effort is normal.     Breath sounds: Normal breath sounds.  Abdominal:     General: Bowel sounds are normal.     Palpations: Abdomen is soft.  Musculoskeletal:     Cervical back: Normal range of motion.  Lymphadenopathy:     Cervical: No cervical adenopathy.  Skin:    General: Skin is warm and dry.     Capillary Refill: Capillary refill takes less than 2 seconds.  Neurological:     Mental Status: She is alert and oriented to person, place, and time.  Psychiatric:        Behavior: Behavior normal.      Musculoskeletal Exam: She has some stiffness with range of motion of her cervical spine.  She had discomfort range of motion of her lumbar spine.  Shoulder joints, elbow joints, wrist joints with good range of motion.  She had [place] prominence especially over the left [facility] joint which was painful.  She had bilateral PIP and DIP thickening with no synovitis.  Hip  joints and knee joints with good range of motion.  She had osteoarthritic changes in her PIPs and DIPs.  CDAI Exam: CDAI Score: -- Patient Global: --; Provider Global: -- Swollen: --; Tender: -- Joint Exam 08/29/2020   No joint exam has been documented for this visit   There is currently no information documented on the homunculus. Go to the Rheumatology activity and complete the homunculus joint exam.  Investigation: No additional findings.  Imaging: XR Hand 2 View Left  Result Date: 08/05/2020 CMC, PIP and DIP narrowing was noted.  No MCP, intercarpal or radiocarpal joint space narrowing was noted.  No erosive changes were noted. Impression: These findings are consistent with osteoarthritis of the hand.  XR Hand 2 View Right  Result Date: 08/05/2020 CMC, PIP and DIP narrowing was noted.  No MCP, intercarpal or radiocarpal joint space narrowing was noted.  No erosive changes were noted. Impression: These findings are consistent with osteoarthritis of the hand.  XR Shoulder Right  Result Date: 08/05/2020 No glenohumeral joint space narrowing was noted.  No chondrocalcinosis was noted.  Acromioclavicular joint space narrowing and spurring was noted. Impression: These findings are consistent with acromioclavicular arthritis.   Recent Labs: Lab Results  Component Value Date   WBC 9.9 01/11/2019   HGB 14.4 01/11/2019   PLT 404 01/11/2019   NA 142 01/11/2019   K 3.7 01/11/2019   CL 101 01/11/2019   CO2 24 01/11/2019   GLUCOSE 86 01/11/2019   BUN 14 01/11/2019   CREATININE 0.70 01/11/2019   BILITOT <0.2 01/11/2019   ALKPHOS 82 01/11/2019   AST 15 01/11/2019   ALT 12 01/11/2019   PROT 7.5 01/11/2019   ALBUMIN 5.3 (H) 01/11/2019   CALCIUM 10.3 01/11/2019   GFRAA 108 01/11/2019   05/15/20: ANA+, ESR 10, dsDNA<1, RNP 3.4, Smith ab-, Ro-, La-, RF <10, PTH 54, Anti-CCP 7, Vitamin D 43.82, CRP 1.   Aug 18, 2020 AVISE lupus index -1.7, ANA 1: 160 homogeneous, ENA negative, CB  CAP negative, anticardiolipin negative, beta-2 GP 1 negative, antiphosphatidylserine negative, antihistone negative, RF negative, anti-CCP negative, anticarP negative, antithyroglobulin negative, anti-TPO negative   Speciality Comments: No specialty comments available.  Procedures:  No procedures performed Allergies: Cymbalta [duloxetine hcl] and Lisinopril   Assessment / Plan:     Visit Diagnoses: Positive ANA (antinuclear antibody) - AVISE panel completely negative except for ANA low titer.  ENA panel was completely negative.  She has no clinical features of autoimmune disease.  Arthritis of right acromioclavicular joint-she has some discomfort with range of motion off-and-on.  Primary osteoarthritis of both hands-she has severe osteoarthritis in her hands with DIP and PIP thickening.  She has left CMC discomfort and pain.  Have given her a prescription for left CMC brace.  Joint protection was discussed.  Trochanteric bursitis, left hip-IT band stretches were given at the last visit.  Have advised her to continue to do the exercises.  Hx of fusion of cervical spine - Dr. [name] June 2019.  Doing well  Chronic bilateral low back pain with bilateral sciatica-she continues to have some lower back pain.  Other medical problems are listed as follows:  Chronic pain syndrome  Essential hypertension, benign  Hypercholesterolemia  Mild diastolic dysfunction  Controlled type 2 diabetes mellitus without complication, without long-term current use of insulin ([facility])  COPD GOLD 0 still smoking   Cigarette smoker - Half a pack per day x 50 years.  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  Vitamin D deficiency  Orders: No orders of the defined types were placed in this encounter.  No orders of the defined types were placed in this encounter.     Follow-Up Instructions: Return in about 6 months (around 02/28/2021) for Osteoarthritis.   Bo Merino, MD  Note - This record has been created using Editor, commissioning.  Chart creation errors have been sought, but may not always  have been located. Such creation errors do not reflect on  the standard of medical care.

## 2020-08-29 ENCOUNTER — Encounter: Payer: Self-pay | Admitting: Rheumatology

## 2020-08-29 ENCOUNTER — Other Ambulatory Visit: Payer: Self-pay

## 2020-08-29 ENCOUNTER — Ambulatory Visit: Payer: Medicaid Other | Admitting: Rheumatology

## 2020-08-29 VITALS — BP 133/82 | HR 59 | Ht 63.0 in | Wt 162.8 lb

## 2020-08-29 DIAGNOSIS — E119 Type 2 diabetes mellitus without complications: Secondary | ICD-10-CM

## 2020-08-29 DIAGNOSIS — G894 Chronic pain syndrome: Secondary | ICD-10-CM | POA: Diagnosis not present

## 2020-08-29 DIAGNOSIS — E7800 Pure hypercholesterolemia, unspecified: Secondary | ICD-10-CM | POA: Diagnosis not present

## 2020-08-29 DIAGNOSIS — M19041 Primary osteoarthritis, right hand: Secondary | ICD-10-CM | POA: Diagnosis not present

## 2020-08-29 DIAGNOSIS — F1911 Other psychoactive substance abuse, in remission: Secondary | ICD-10-CM

## 2020-08-29 DIAGNOSIS — M19011 Primary osteoarthritis, right shoulder: Secondary | ICD-10-CM | POA: Diagnosis not present

## 2020-08-29 DIAGNOSIS — I10 Essential (primary) hypertension: Secondary | ICD-10-CM | POA: Diagnosis not present

## 2020-08-29 DIAGNOSIS — Z8719 Personal history of other diseases of the digestive system: Secondary | ICD-10-CM

## 2020-08-29 DIAGNOSIS — M5441 Lumbago with sciatica, right side: Secondary | ICD-10-CM

## 2020-08-29 DIAGNOSIS — F17210 Nicotine dependence, cigarettes, uncomplicated: Secondary | ICD-10-CM

## 2020-08-29 DIAGNOSIS — I519 Heart disease, unspecified: Secondary | ICD-10-CM

## 2020-08-29 DIAGNOSIS — R768 Other specified abnormal immunological findings in serum: Secondary | ICD-10-CM

## 2020-08-29 DIAGNOSIS — Z981 Arthrodesis status: Secondary | ICD-10-CM

## 2020-08-29 DIAGNOSIS — M5442 Lumbago with sciatica, left side: Secondary | ICD-10-CM | POA: Diagnosis not present

## 2020-08-29 DIAGNOSIS — M19042 Primary osteoarthritis, left hand: Secondary | ICD-10-CM

## 2020-08-29 DIAGNOSIS — E559 Vitamin D deficiency, unspecified: Secondary | ICD-10-CM

## 2020-08-29 DIAGNOSIS — G8929 Other chronic pain: Secondary | ICD-10-CM

## 2020-08-29 DIAGNOSIS — M7062 Trochanteric bursitis, left hip: Secondary | ICD-10-CM | POA: Diagnosis not present

## 2020-08-29 DIAGNOSIS — J449 Chronic obstructive pulmonary disease, unspecified: Secondary | ICD-10-CM

## 2020-08-29 NOTE — Patient Instructions (Signed)
Hand Exercises Hand exercises can be helpful for almost anyone. These exercises can strengthen the hands, improve flexibility and movement, and increase blood flow to the hands. These results can make work and daily tasks easier. Hand exercises can be especially helpful for people who have joint pain from arthritis or have nerve damage from overuse (carpal tunnel syndrome). These exercises can also help people who have injured a hand. Exercises Most of these hand exercises are gentle stretching and motion exercises. It is usually safe to do them often throughout the day. Warming up your hands before exercise may help to reduce stiffness. You can do this with gentle massage or by placing your hands in warm water for 10-15 minutes. It is normal to feel some stretching, pulling, tightness, or mild discomfort as you begin new exercises. This will gradually improve. Stop an exercise right away if you feel sudden, severe pain or your pain gets worse. Ask your health care provider which exercises are best for you. Knuckle bend or "claw" fist 1. Stand or sit with your arm, hand, and all five fingers pointed straight up. Make sure to keep your wrist straight during the exercise. 2. Gently bend your fingers down toward your palm until the tips of your fingers are touching the top of your palm. Keep your big knuckle straight and just bend the small knuckles in your fingers. 3. Hold this position for __________ seconds. 4. Straighten (extend) your fingers back to the starting position. Repeat this exercise 5-10 times with each hand. Full finger fist 1. Stand or sit with your arm, hand, and all five fingers pointed straight up. Make sure to keep your wrist straight during the exercise. 2. Gently bend your fingers into your palm until the tips of your fingers are touching the middle of your palm. 3. Hold this position for __________ seconds. 4. Extend your fingers back to the starting position, stretching every  joint fully. Repeat this exercise 5-10 times with each hand. Straight fist 1. Stand or sit with your arm, hand, and all five fingers pointed straight up. Make sure to keep your wrist straight during the exercise. 2. Gently bend your fingers at the big knuckle, where your fingers meet your hand, and the middle knuckle. Keep the knuckle at the tips of your fingers straight and try to touch the bottom of your palm. 3. Hold this position for __________ seconds. 4. Extend your fingers back to the starting position, stretching every joint fully. Repeat this exercise 5-10 times with each hand. Tabletop 1. Stand or sit with your arm, hand, and all five fingers pointed straight up. Make sure to keep your wrist straight during the exercise. 2. Gently bend your fingers at the big knuckle, where your fingers meet your hand, as far down as you can while keeping the small knuckles in your fingers straight. Think of forming a tabletop with your fingers. 3. Hold this position for __________ seconds. 4. Extend your fingers back to the starting position, stretching every joint fully. Repeat this exercise 5-10 times with each hand. Finger spread 1. Place your hand flat on a table with your palm facing down. Make sure your wrist stays straight as you do this exercise. 2. Spread your fingers and thumb apart from each other as far as you can until you feel a gentle stretch. Hold this position for __________ seconds. 3. Bring your fingers and thumb tight together again. Hold this position for __________ seconds. Repeat this exercise 5-10 times with each hand.   Making circles 1. Stand or sit with your arm, hand, and all five fingers pointed straight up. Make sure to keep your wrist straight during the exercise. 2. Make a circle by touching the tip of your thumb to the tip of your index finger. 3. Hold for __________ seconds. Then open your hand wide. 4. Repeat this motion with your thumb and each finger on your  hand. Repeat this exercise 5-10 times with each hand. Thumb motion 1. Sit with your forearm resting on a table and your wrist straight. Your thumb should be facing up toward the ceiling. Keep your fingers relaxed as you move your thumb. 2. Lift your thumb up as high as you can toward the ceiling. Hold for __________ seconds. 3. Bend your thumb across your palm as far as you can, reaching the tip of your thumb for the small finger (pinkie) side of your palm. Hold for __________ seconds. Repeat this exercise 5-10 times with each hand. Grip strengthening 1. Hold a stress ball or other soft ball in the middle of your hand. 2. Slowly increase the pressure, squeezing the ball as much as you can without causing pain. Think of bringing the tips of your fingers into the middle of your palm. All of your finger joints should bend when doing this exercise. 3. Hold your squeeze for __________ seconds, then relax. Repeat this exercise 5-10 times with each hand.   Contact a health care provider if:  Your hand pain or discomfort gets much worse when you do an exercise.  Your hand pain or discomfort does not improve within 2 hours after you exercise. If you have any of these problems, stop doing these exercises right away. Do not do them again unless your health care provider says that you can. Get help right away if:  You develop sudden, severe hand pain or swelling. If this happens, stop doing these exercises right away. Do not do them again unless your health care provider says that you can. This information is not intended to replace advice given to you by your health care provider. Make sure you discuss any questions you have with your health care provider. Document Revised: 07/06/2018 Document Reviewed: 03/16/2018 Elsevier Patient Education  2021 [employer]  

## 2020-09-10 DIAGNOSIS — Z79899 Other long term (current) drug therapy: Secondary | ICD-10-CM | POA: Diagnosis not present

## 2020-09-13 DIAGNOSIS — Z79899 Other long term (current) drug therapy: Secondary | ICD-10-CM | POA: Diagnosis not present

## 2020-10-09 DIAGNOSIS — Z79899 Other long term (current) drug therapy: Secondary | ICD-10-CM | POA: Diagnosis not present

## 2020-10-12 DIAGNOSIS — Z79899 Other long term (current) drug therapy: Secondary | ICD-10-CM | POA: Diagnosis not present

## 2020-11-10 DIAGNOSIS — Z79899 Other long term (current) drug therapy: Secondary | ICD-10-CM | POA: Diagnosis not present

## 2020-11-10 DIAGNOSIS — E559 Vitamin D deficiency, unspecified: Secondary | ICD-10-CM | POA: Diagnosis not present

## 2020-11-12 DIAGNOSIS — Z79899 Other long term (current) drug therapy: Secondary | ICD-10-CM | POA: Diagnosis not present

## 2020-12-10 DIAGNOSIS — Z79899 Other long term (current) drug therapy: Secondary | ICD-10-CM | POA: Diagnosis not present

## 2020-12-24 DIAGNOSIS — E119 Type 2 diabetes mellitus without complications: Secondary | ICD-10-CM | POA: Diagnosis not present

## 2020-12-24 DIAGNOSIS — Z1159 Encounter for screening for other viral diseases: Secondary | ICD-10-CM | POA: Diagnosis not present

## 2020-12-24 DIAGNOSIS — R5383 Other fatigue: Secondary | ICD-10-CM | POA: Diagnosis not present

## 2020-12-24 DIAGNOSIS — E7800 Pure hypercholesterolemia, unspecified: Secondary | ICD-10-CM | POA: Diagnosis not present

## 2020-12-24 DIAGNOSIS — Z0000 Encounter for general adult medical examination without abnormal findings: Secondary | ICD-10-CM | POA: Diagnosis not present

## 2021-01-09 DIAGNOSIS — Z79899 Other long term (current) drug therapy: Secondary | ICD-10-CM | POA: Diagnosis not present

## 2021-01-13 DIAGNOSIS — M5412 Radiculopathy, cervical region: Secondary | ICD-10-CM | POA: Diagnosis not present

## 2021-01-13 DIAGNOSIS — M542 Cervicalgia: Secondary | ICD-10-CM | POA: Diagnosis not present

## 2021-01-14 DIAGNOSIS — Z79899 Other long term (current) drug therapy: Secondary | ICD-10-CM | POA: Diagnosis not present

## 2021-02-02 DIAGNOSIS — M542 Cervicalgia: Secondary | ICD-10-CM | POA: Diagnosis not present

## 2021-02-03 ENCOUNTER — Other Ambulatory Visit: Payer: Self-pay | Admitting: Orthopedic Surgery

## 2021-02-04 ENCOUNTER — Other Ambulatory Visit: Payer: Self-pay | Admitting: Orthopedic Surgery

## 2021-02-04 DIAGNOSIS — M542 Cervicalgia: Secondary | ICD-10-CM

## 2021-02-10 DIAGNOSIS — Z79899 Other long term (current) drug therapy: Secondary | ICD-10-CM | POA: Diagnosis not present

## 2021-02-16 DIAGNOSIS — M5412 Radiculopathy, cervical region: Secondary | ICD-10-CM | POA: Diagnosis not present

## 2021-03-03 ENCOUNTER — Other Ambulatory Visit (HOSPITAL_COMMUNITY): Payer: Self-pay | Admitting: Orthopedic Surgery

## 2021-03-03 DIAGNOSIS — M542 Cervicalgia: Secondary | ICD-10-CM

## 2021-03-06 ENCOUNTER — Other Ambulatory Visit: Payer: Medicaid Other

## 2021-03-11 DIAGNOSIS — Z79899 Other long term (current) drug therapy: Secondary | ICD-10-CM | POA: Diagnosis not present

## 2021-03-18 ENCOUNTER — Other Ambulatory Visit: Payer: Self-pay

## 2021-03-18 ENCOUNTER — Encounter (HOSPITAL_COMMUNITY): Payer: Self-pay

## 2021-03-18 ENCOUNTER — Ambulatory Visit (HOSPITAL_COMMUNITY)
Admission: RE | Admit: 2021-03-18 | Discharge: 2021-03-18 | Disposition: A | Discharge status: Home / Self Care | Payer: Medicaid Other | Source: Ambulatory Visit | Attending: Orthopedic Surgery | Admitting: Orthopedic Surgery

## 2021-03-18 DIAGNOSIS — M542 Cervicalgia: Secondary | ICD-10-CM | POA: Insufficient documentation

## 2021-03-18 DIAGNOSIS — M2578 Osteophyte, vertebrae: Secondary | ICD-10-CM | POA: Diagnosis not present

## 2021-03-18 DIAGNOSIS — M4802 Spinal stenosis, cervical region: Secondary | ICD-10-CM | POA: Diagnosis not present

## 2021-04-10 DIAGNOSIS — Z79899 Other long term (current) drug therapy: Secondary | ICD-10-CM | POA: Diagnosis not present

## 2021-04-14 DIAGNOSIS — Z79899 Other long term (current) drug therapy: Secondary | ICD-10-CM | POA: Diagnosis not present

## 2021-04-21 DIAGNOSIS — M5412 Radiculopathy, cervical region: Secondary | ICD-10-CM | POA: Diagnosis not present

## 2021-05-05 DIAGNOSIS — Z79899 Other long term (current) drug therapy: Secondary | ICD-10-CM | POA: Diagnosis not present

## 2021-05-07 DIAGNOSIS — Z79899 Other long term (current) drug therapy: Secondary | ICD-10-CM | POA: Diagnosis not present

## 2021-06-03 DIAGNOSIS — Z79899 Other long term (current) drug therapy: Secondary | ICD-10-CM | POA: Diagnosis not present

## 2021-06-05 DIAGNOSIS — Z79899 Other long term (current) drug therapy: Secondary | ICD-10-CM | POA: Diagnosis not present

## 2021-07-06 DIAGNOSIS — Z79899 Other long term (current) drug therapy: Secondary | ICD-10-CM | POA: Diagnosis not present

## 2021-07-08 DIAGNOSIS — Z79899 Other long term (current) drug therapy: Secondary | ICD-10-CM | POA: Diagnosis not present

## 2021-08-03 DIAGNOSIS — Z79899 Other long term (current) drug therapy: Secondary | ICD-10-CM | POA: Diagnosis not present

## 2021-09-03 DIAGNOSIS — Z79899 Other long term (current) drug therapy: Secondary | ICD-10-CM | POA: Diagnosis not present

## 2021-09-07 DIAGNOSIS — Z79899 Other long term (current) drug therapy: Secondary | ICD-10-CM | POA: Diagnosis not present

## 2021-10-05 DIAGNOSIS — Z79899 Other long term (current) drug therapy: Secondary | ICD-10-CM | POA: Diagnosis not present

## 2021-10-07 DIAGNOSIS — Z79899 Other long term (current) drug therapy: Secondary | ICD-10-CM | POA: Diagnosis not present

## 2021-11-03 DIAGNOSIS — Z79899 Other long term (current) drug therapy: Secondary | ICD-10-CM | POA: Diagnosis not present

## 2021-11-05 DIAGNOSIS — Z79899 Other long term (current) drug therapy: Secondary | ICD-10-CM | POA: Diagnosis not present

## 2021-11-06 DIAGNOSIS — M5416 Radiculopathy, lumbar region: Secondary | ICD-10-CM | POA: Diagnosis not present

## 2021-11-20 DIAGNOSIS — M545 Low back pain, unspecified: Secondary | ICD-10-CM | POA: Diagnosis not present

## 2021-11-27 DIAGNOSIS — M5416 Radiculopathy, lumbar region: Secondary | ICD-10-CM | POA: Diagnosis not present

## 2021-11-27 DIAGNOSIS — M542 Cervicalgia: Secondary | ICD-10-CM | POA: Diagnosis not present

## 2021-12-02 DIAGNOSIS — Z79899 Other long term (current) drug therapy: Secondary | ICD-10-CM | POA: Diagnosis not present

## 2021-12-04 DIAGNOSIS — Z79899 Other long term (current) drug therapy: Secondary | ICD-10-CM | POA: Diagnosis not present

## 2023-03-06 IMAGING — CT CT CERVICAL SPINE W/O CM
3 of 4 series · 12 of 33 positions shown, 14 images · non-contrast
Comparison: Cervical spine MRI 02/02/2021.

CLINICAL DATA: 65-year-old female with posterior neck pain
radiating to the left hand. Prior surgery. Query nonunion.

EXAM:
CT CERVICAL SPINE WITHOUT CONTRAST
TECHNIQUE: Multidetector CT imaging of the cervical spine was performed without
intravenous contrast. Multiplanar CT image reconstructions were also
generated.

[Series 7: sagittal bone · sagittal · 0.29mm/px · 5 of 61 slices shown, 6 images]
[im 21/61  bone]
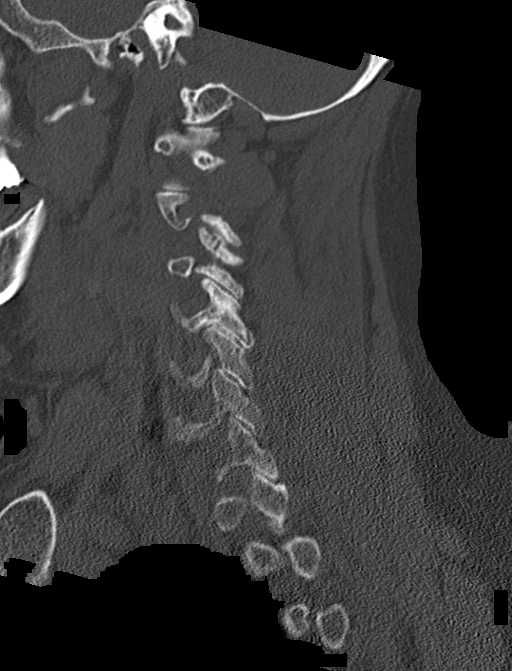
[im 26/61  bone]
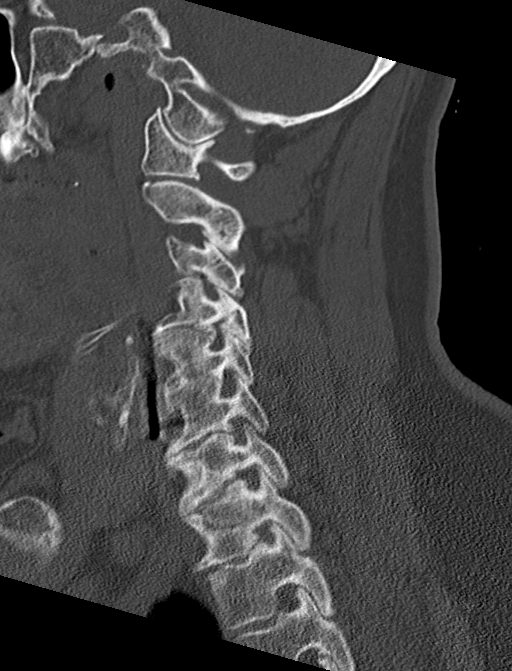
[im 31/61  soft-tissue]
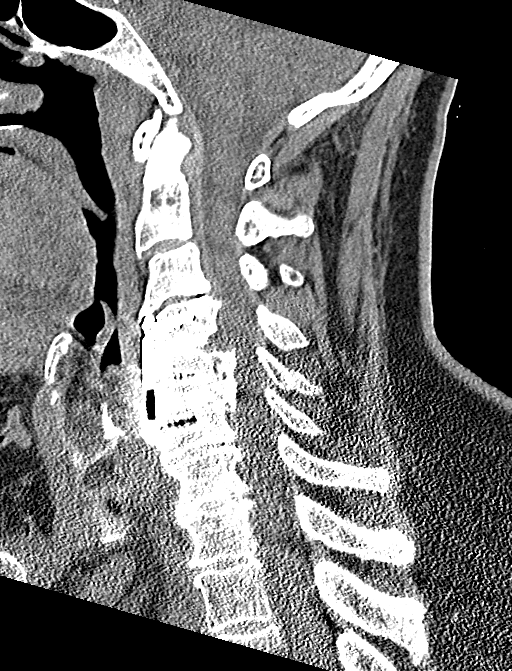
[im 31/61  bone]
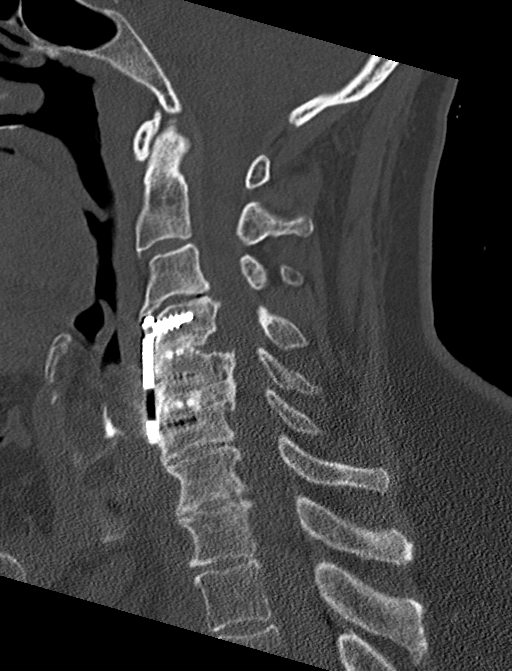
[im 36/61  bone]
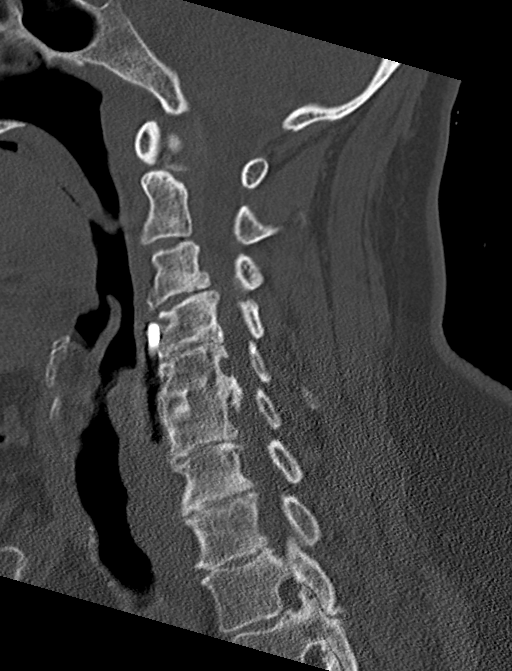
[im 41/61  bone]
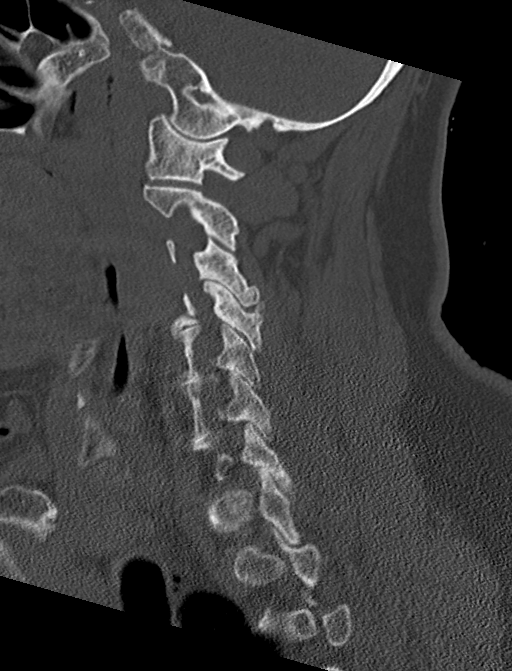

[Series 8: coronal bone · coronal · 0.26mm/px · 3 of 61 slices shown]
[im 13/61  bone]
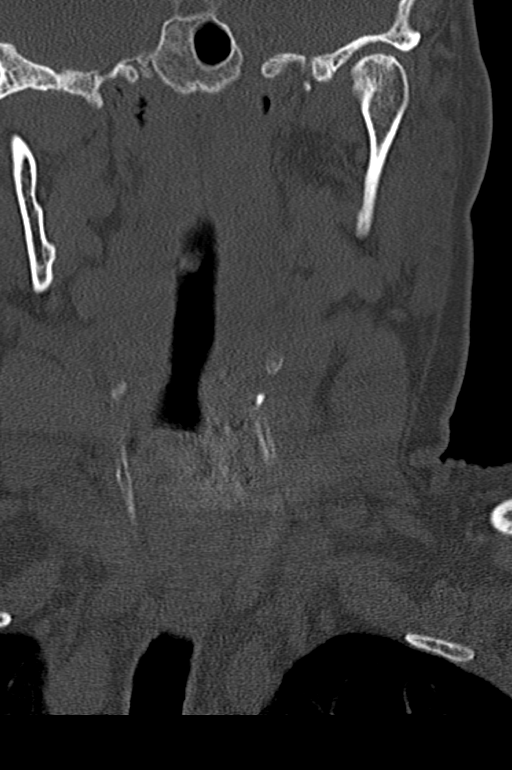
[im 25/61  bone]
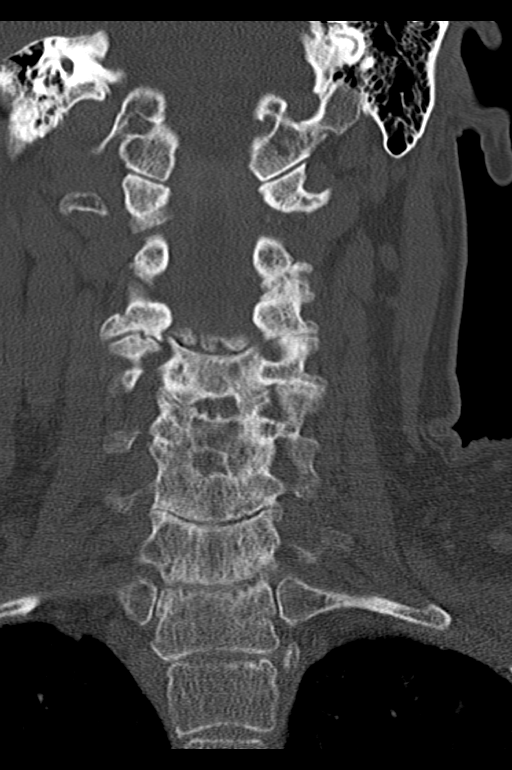
[im 37/61  bone]
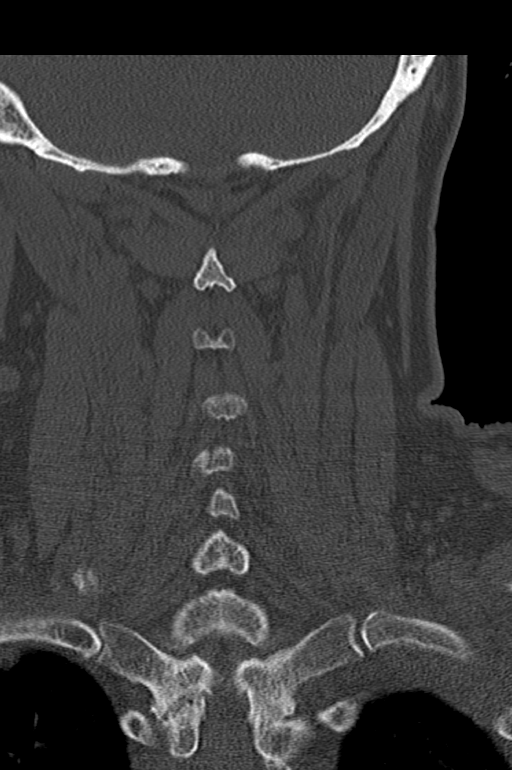

[Series 9: orthogonal axials · axial · 0.23mm/px · z∈[-197,-78]mm · 4 of 98 slices shown, 5 images]
[im 17/98  soft-tissue]
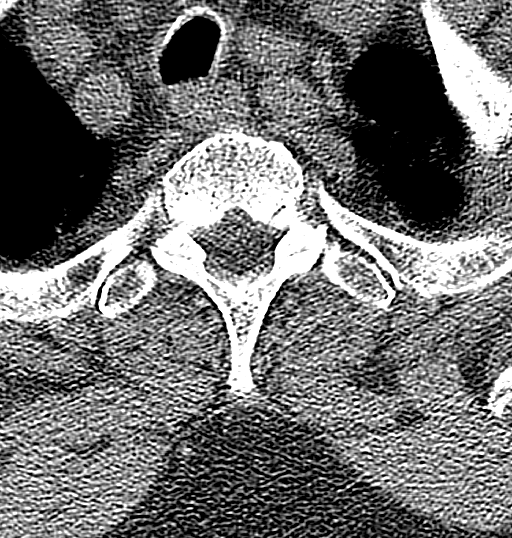
[im 17/98  bone]
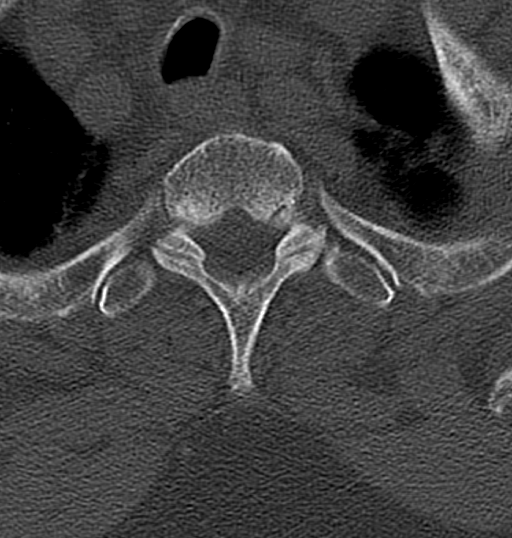
[im 33/98  bone]
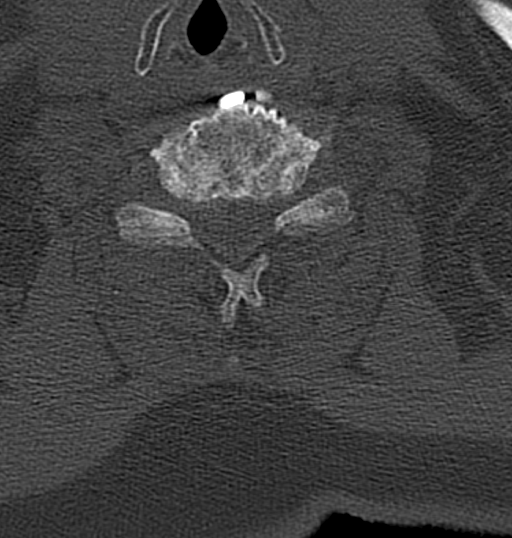
[im 65/98  bone]
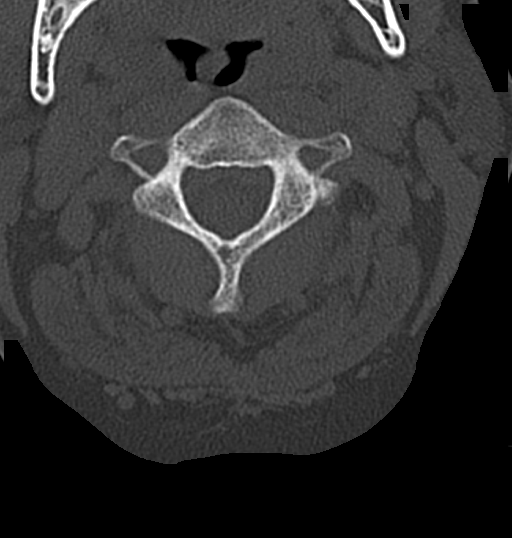
[im 81/98  bone]
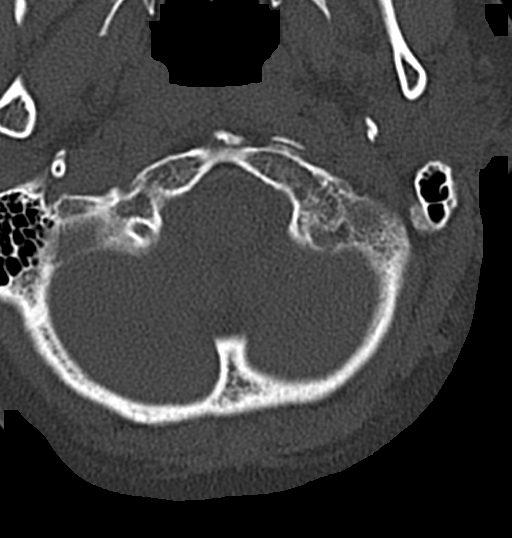

[12 of 33 positions shown; findings below may reference images not displayed]

FINDINGS: Alignment: Stable straightening of cervical lordosis.
Cervicothoracic junction alignment is within normal limits.
Bilateral posterior element alignment is within normal limits.

Skull base and vertebrae: Visualized skull base is intact. No
atlanto-occipital dissociation. Background bone mineralization is
normal. Postoperative details are below. No acute osseous
abnormality identified.

Soft tissues and spinal canal: Negative visible noncontrast neck
soft tissues.

Disc levels:

C2-C3: Foraminal endplate spurring and moderate left facet
hypertrophy are stable from the MRI last month with up to moderate
associated left C3 foraminal stenosis.

C3-C4: Disc space loss with circumferential disc osteophyte complex
and up to moderate facet hypertrophy greater on the right. Stable
mild spinal stenosis, moderate to severe left and severe right C4
neural foraminal stenosis.

C4-C5: ACDF. No evidence of hardware loosening. And solid appearing
interbody arthrodesis. Developing left posterior element
arthrodesis. Residual endplate spurring with moderate to severe left
C5 foraminal stenosis (sagittal image 36).

There is some C5 level Ossification of the posterior longitudinal
ligament (OPLL), see series 3, image 49. Mild associated spinal
stenosis appears stable.

C5-C6: ACDF. No evidence of hardware loosening. Solid interbody
arthrodesis. Mild bilateral osseous foraminal stenosis.

C6-C7: Disc space loss but evidence of developing interbody
ankylosis via anterior endplate osteophyte on sagittal image 33.
Foraminal endplate spurring greater on the left, mild to moderate
left C7 foraminal stenosis.

C7-T1: Disc space loss with circumferential disc osteophyte complex
and some interbody vacuum phenomena. Foraminal involvement with mild
to moderate left C8 foraminal stenosis.

T1-T2: Disc space loss with circumferential disc osteophyte complex.
Mild facet hypertrophy. Moderate to severe bilateral T1 foraminal
stenosis appears stable.

Upper chest: Centrilobular emphysema in the upper lungs. Negative
visible noncontrast thoracic inlet.

Other: Negative visible posterior fossa. Visible sphenoid sinuses,
tympanic cavities and mastoids are clear.
IMPRESSION: 1. ACDF at C4-C5 and C5-C6 with solid appearing arthrodesis at both
levels, especially the latter. Some superimposed ossification of the
posterior longitudinal ligament (OPLL) suspected at C5 with mild
associated spinal stenosis.

2. Evidence of developing interbody ankylosis at the adjacent C6-C7
segment from anterior endplate osteophytosis.

3. Adjacent segment disease at C3-C4 appears stable since the
[place] MRI with mild spinal and moderate to severe right greater
than left C4 foraminal stenosis.

4. Advanced disc and endplate degeneration at C7-T1 and T1-T2 with
associated foraminal stenosis more pronounced at the latter.

5. Emphysema ([id number]).
